# Patient Record
Sex: Male | Born: 1935 | Race: White | Hispanic: No | Smoking: Former smoker
Health system: Southern US, Community
[De-identification: ages and names within clinical notes are randomized; demographics above are authoritative.]

## PROBLEM LIST (undated history)

## (undated) DIAGNOSIS — J449 Chronic obstructive pulmonary disease, unspecified: Secondary | ICD-10-CM

## (undated) DIAGNOSIS — N400 Enlarged prostate without lower urinary tract symptoms: Secondary | ICD-10-CM

## (undated) DIAGNOSIS — K219 Gastro-esophageal reflux disease without esophagitis: Secondary | ICD-10-CM

## (undated) DIAGNOSIS — I4821 Permanent atrial fibrillation: Secondary | ICD-10-CM

## (undated) DIAGNOSIS — Z9289 Personal history of other medical treatment: Secondary | ICD-10-CM

## (undated) DIAGNOSIS — E785 Hyperlipidemia, unspecified: Secondary | ICD-10-CM

## (undated) DIAGNOSIS — M545 Low back pain, unspecified: Secondary | ICD-10-CM

## (undated) DIAGNOSIS — I714 Abdominal aortic aneurysm, without rupture, unspecified: Secondary | ICD-10-CM

## (undated) DIAGNOSIS — I10 Essential (primary) hypertension: Secondary | ICD-10-CM

## (undated) DIAGNOSIS — I5032 Chronic diastolic (congestive) heart failure: Secondary | ICD-10-CM

## (undated) DIAGNOSIS — Z8619 Personal history of other infectious and parasitic diseases: Secondary | ICD-10-CM

## (undated) DIAGNOSIS — I2510 Atherosclerotic heart disease of native coronary artery without angina pectoris: Secondary | ICD-10-CM

## (undated) DIAGNOSIS — F419 Anxiety disorder, unspecified: Secondary | ICD-10-CM

## (undated) DIAGNOSIS — I495 Sick sinus syndrome: Secondary | ICD-10-CM

## (undated) DIAGNOSIS — K635 Polyp of colon: Secondary | ICD-10-CM

## (undated) DIAGNOSIS — I739 Peripheral vascular disease, unspecified: Secondary | ICD-10-CM

## (undated) DIAGNOSIS — M1990 Unspecified osteoarthritis, unspecified site: Secondary | ICD-10-CM

## (undated) DIAGNOSIS — K5730 Diverticulosis of large intestine without perforation or abscess without bleeding: Secondary | ICD-10-CM

## (undated) DIAGNOSIS — R918 Other nonspecific abnormal finding of lung field: Secondary | ICD-10-CM

## (undated) DIAGNOSIS — G459 Transient cerebral ischemic attack, unspecified: Secondary | ICD-10-CM

## (undated) DIAGNOSIS — M519 Unspecified thoracic, thoracolumbar and lumbosacral intervertebral disc disorder: Secondary | ICD-10-CM

## (undated) DIAGNOSIS — I359 Nonrheumatic aortic valve disorder, unspecified: Secondary | ICD-10-CM

## (undated) HISTORY — PX: PACEMAKER PLACEMENT: SHX43

## (undated) HISTORY — DX: Personal history of other infectious and parasitic diseases: Z86.19

## (undated) HISTORY — DX: Transient cerebral ischemic attack, unspecified: G45.9

## (undated) HISTORY — DX: Anxiety disorder, unspecified: F41.9

## (undated) HISTORY — DX: Nonrheumatic aortic valve disorder, unspecified: I35.9

## (undated) HISTORY — DX: Hyperlipidemia, unspecified: E78.5

## (undated) HISTORY — DX: Atherosclerotic heart disease of native coronary artery without angina pectoris: I25.10

## (undated) HISTORY — DX: Low back pain: M54.5

## (undated) HISTORY — DX: Polyp of colon: K63.5

## (undated) HISTORY — PX: AORTIC VALVE REPLACEMENT: SHX41

## (undated) HISTORY — DX: Abdominal aortic aneurysm, without rupture, unspecified: I71.40

## (undated) HISTORY — DX: Benign prostatic hyperplasia without lower urinary tract symptoms: N40.0

## (undated) HISTORY — DX: Unspecified osteoarthritis, unspecified site: M19.90

## (undated) HISTORY — DX: Diverticulosis of large intestine without perforation or abscess without bleeding: K57.30

## (undated) HISTORY — DX: Essential (primary) hypertension: I10

## (undated) HISTORY — PX: ABDOMINAL AORTIC ANEURYSM REPAIR: SUR1152

## (undated) HISTORY — DX: Chronic diastolic (congestive) heart failure: I50.32

## (undated) HISTORY — DX: Gastro-esophageal reflux disease without esophagitis: K21.9

## (undated) HISTORY — DX: Abdominal aortic aneurysm, without rupture: I71.4

## (undated) HISTORY — DX: Low back pain, unspecified: M54.50

## (undated) HISTORY — DX: Peripheral vascular disease, unspecified: I73.9

## (undated) HISTORY — DX: Sick sinus syndrome: I49.5

## (undated) HISTORY — DX: Permanent atrial fibrillation: I48.21

## (undated) HISTORY — DX: Other nonspecific abnormal finding of lung field: R91.8

## (undated) HISTORY — DX: Chronic obstructive pulmonary disease, unspecified: J44.9

## (undated) HISTORY — PX: RETINAL DETACHMENT SURGERY: SHX105

## (undated) HISTORY — DX: Personal history of other medical treatment: Z92.89

## (undated) HISTORY — DX: Unspecified thoracic, thoracolumbar and lumbosacral intervertebral disc disorder: M51.9

---

## 1991-06-09 HISTORY — PX: CORONARY ARTERY BYPASS GRAFT: SHX141

## 1997-11-15 ENCOUNTER — Ambulatory Visit (HOSPITAL_COMMUNITY): Admission: RE | Admit: 1997-11-15 | Discharge: 1997-11-15 | Payer: Self-pay | Admitting: Internal Medicine

## 1998-10-21 ENCOUNTER — Inpatient Hospital Stay (HOSPITAL_COMMUNITY): Admission: RE | Admit: 1998-10-21 | Discharge: 1998-10-23 | Payer: Self-pay | Admitting: Gastroenterology

## 1999-06-23 ENCOUNTER — Encounter: Admission: RE | Admit: 1999-06-23 | Discharge: 1999-06-23 | Payer: Self-pay | Admitting: *Deleted

## 1999-06-23 ENCOUNTER — Encounter: Payer: Self-pay | Admitting: *Deleted

## 1999-07-08 ENCOUNTER — Encounter: Payer: Self-pay | Admitting: *Deleted

## 1999-07-09 ENCOUNTER — Ambulatory Visit: Admission: RE | Admit: 1999-07-09 | Discharge: 1999-07-09 | Payer: Self-pay | Admitting: *Deleted

## 1999-08-12 ENCOUNTER — Inpatient Hospital Stay (HOSPITAL_COMMUNITY): Admission: RE | Admit: 1999-08-12 | Discharge: 1999-08-14 | Payer: Self-pay | Admitting: *Deleted

## 1999-08-12 ENCOUNTER — Encounter: Payer: Self-pay | Admitting: *Deleted

## 1999-10-06 ENCOUNTER — Encounter: Payer: Self-pay | Admitting: *Deleted

## 1999-10-06 ENCOUNTER — Encounter: Admission: RE | Admit: 1999-10-06 | Discharge: 1999-10-06 | Payer: Self-pay | Admitting: *Deleted

## 1999-12-29 ENCOUNTER — Encounter: Payer: Self-pay | Admitting: *Deleted

## 1999-12-29 ENCOUNTER — Encounter: Admission: RE | Admit: 1999-12-29 | Discharge: 1999-12-29 | Payer: Self-pay | Admitting: *Deleted

## 2000-07-05 ENCOUNTER — Encounter: Payer: Self-pay | Admitting: *Deleted

## 2000-07-05 ENCOUNTER — Encounter: Admission: RE | Admit: 2000-07-05 | Discharge: 2000-07-05 | Payer: Self-pay | Admitting: Gastroenterology

## 2000-12-27 ENCOUNTER — Encounter: Payer: Self-pay | Admitting: *Deleted

## 2000-12-27 ENCOUNTER — Encounter: Admission: RE | Admit: 2000-12-27 | Discharge: 2000-12-27 | Payer: Self-pay | Admitting: *Deleted

## 2001-02-25 ENCOUNTER — Encounter (INDEPENDENT_AMBULATORY_CARE_PROVIDER_SITE_OTHER): Payer: Self-pay | Admitting: Specialist

## 2001-02-25 ENCOUNTER — Other Ambulatory Visit: Admission: RE | Admit: 2001-02-25 | Discharge: 2001-02-25 | Payer: Self-pay | Admitting: Gastroenterology

## 2001-12-26 ENCOUNTER — Encounter: Admission: RE | Admit: 2001-12-26 | Discharge: 2001-12-26 | Payer: Self-pay | Admitting: *Deleted

## 2001-12-26 ENCOUNTER — Encounter: Payer: Self-pay | Admitting: *Deleted

## 2003-01-22 ENCOUNTER — Encounter: Admission: RE | Admit: 2003-01-22 | Discharge: 2003-01-22 | Payer: Self-pay | Admitting: *Deleted

## 2003-01-22 ENCOUNTER — Encounter: Payer: Self-pay | Admitting: *Deleted

## 2003-07-24 ENCOUNTER — Ambulatory Visit (HOSPITAL_COMMUNITY): Admission: RE | Admit: 2003-07-24 | Discharge: 2003-07-24 | Payer: Self-pay | Admitting: Internal Medicine

## 2004-01-21 ENCOUNTER — Encounter: Admission: RE | Admit: 2004-01-21 | Discharge: 2004-01-21 | Payer: Self-pay | Admitting: *Deleted

## 2004-05-05 ENCOUNTER — Ambulatory Visit: Payer: Self-pay | Admitting: Internal Medicine

## 2004-05-14 ENCOUNTER — Ambulatory Visit: Payer: Self-pay | Admitting: *Deleted

## 2004-05-20 ENCOUNTER — Ambulatory Visit: Payer: Self-pay

## 2004-05-20 ENCOUNTER — Ambulatory Visit: Payer: Self-pay | Admitting: *Deleted

## 2004-06-03 ENCOUNTER — Ambulatory Visit: Payer: Self-pay | Admitting: Cardiology

## 2004-06-24 ENCOUNTER — Ambulatory Visit: Payer: Self-pay | Admitting: Internal Medicine

## 2004-07-22 ENCOUNTER — Ambulatory Visit: Payer: Self-pay | Admitting: Cardiology

## 2004-08-01 ENCOUNTER — Ambulatory Visit: Payer: Self-pay | Admitting: Cardiology

## 2004-08-22 ENCOUNTER — Ambulatory Visit: Payer: Self-pay | Admitting: Cardiology

## 2004-09-10 ENCOUNTER — Ambulatory Visit: Payer: Self-pay | Admitting: *Deleted

## 2004-09-10 ENCOUNTER — Ambulatory Visit: Payer: Self-pay | Admitting: Internal Medicine

## 2004-10-08 ENCOUNTER — Ambulatory Visit: Payer: Self-pay | Admitting: *Deleted

## 2004-11-05 ENCOUNTER — Ambulatory Visit: Payer: Self-pay | Admitting: Cardiology

## 2004-11-26 ENCOUNTER — Ambulatory Visit: Payer: Self-pay | Admitting: *Deleted

## 2004-12-16 ENCOUNTER — Ambulatory Visit: Payer: Self-pay | Admitting: Cardiology

## 2005-01-06 ENCOUNTER — Ambulatory Visit: Payer: Self-pay | Admitting: Internal Medicine

## 2005-01-14 ENCOUNTER — Ambulatory Visit: Payer: Self-pay | Admitting: *Deleted

## 2005-01-23 ENCOUNTER — Ambulatory Visit: Payer: Self-pay | Admitting: *Deleted

## 2005-01-26 ENCOUNTER — Encounter: Admission: RE | Admit: 2005-01-26 | Discharge: 2005-01-26 | Payer: Self-pay | Admitting: *Deleted

## 2005-01-28 ENCOUNTER — Ambulatory Visit: Payer: Self-pay | Admitting: Cardiology

## 2005-01-28 ENCOUNTER — Ambulatory Visit: Payer: Self-pay | Admitting: Internal Medicine

## 2005-01-30 ENCOUNTER — Ambulatory Visit: Payer: Self-pay | Admitting: Internal Medicine

## 2005-01-30 ENCOUNTER — Ambulatory Visit (HOSPITAL_COMMUNITY): Admission: RE | Admit: 2005-01-30 | Discharge: 2005-01-30 | Payer: Self-pay | Admitting: Internal Medicine

## 2005-02-02 ENCOUNTER — Ambulatory Visit: Payer: Self-pay | Admitting: Internal Medicine

## 2005-02-02 ENCOUNTER — Inpatient Hospital Stay (HOSPITAL_COMMUNITY): Admission: EM | Admit: 2005-02-02 | Discharge: 2005-02-05 | Payer: Self-pay | Admitting: Emergency Medicine

## 2005-02-03 ENCOUNTER — Ambulatory Visit: Payer: Self-pay | Admitting: Internal Medicine

## 2005-02-04 ENCOUNTER — Encounter: Payer: Self-pay | Admitting: Cardiology

## 2005-02-12 ENCOUNTER — Ambulatory Visit: Payer: Self-pay | Admitting: Internal Medicine

## 2005-02-12 ENCOUNTER — Ambulatory Visit: Payer: Self-pay | Admitting: Cardiology

## 2005-02-17 ENCOUNTER — Ambulatory Visit: Payer: Self-pay | Admitting: Internal Medicine

## 2005-02-19 ENCOUNTER — Ambulatory Visit: Payer: Self-pay | Admitting: Internal Medicine

## 2005-03-04 ENCOUNTER — Ambulatory Visit: Payer: Self-pay | Admitting: Internal Medicine

## 2005-03-05 ENCOUNTER — Ambulatory Visit: Payer: Self-pay | Admitting: Cardiology

## 2005-03-13 ENCOUNTER — Ambulatory Visit: Payer: Self-pay | Admitting: Internal Medicine

## 2005-03-27 ENCOUNTER — Ambulatory Visit: Payer: Self-pay | Admitting: Cardiovascular Disease

## 2005-04-09 ENCOUNTER — Ambulatory Visit: Payer: Self-pay | Admitting: Internal Medicine

## 2005-04-17 ENCOUNTER — Ambulatory Visit: Payer: Self-pay | Admitting: Cardiology

## 2005-04-24 ENCOUNTER — Ambulatory Visit: Payer: Self-pay | Admitting: Internal Medicine

## 2005-05-14 ENCOUNTER — Ambulatory Visit: Payer: Self-pay | Admitting: *Deleted

## 2005-05-14 ENCOUNTER — Encounter: Payer: Self-pay | Admitting: Internal Medicine

## 2005-05-14 ENCOUNTER — Ambulatory Visit: Payer: Self-pay

## 2005-05-14 ENCOUNTER — Ambulatory Visit: Payer: Self-pay | Admitting: Cardiology

## 2005-06-16 ENCOUNTER — Ambulatory Visit: Payer: Self-pay | Admitting: *Deleted

## 2005-06-16 ENCOUNTER — Ambulatory Visit: Payer: Self-pay | Admitting: Cardiology

## 2005-07-02 ENCOUNTER — Ambulatory Visit: Payer: Self-pay | Admitting: Cardiology

## 2005-07-02 ENCOUNTER — Ambulatory Visit: Payer: Self-pay | Admitting: *Deleted

## 2005-07-06 ENCOUNTER — Ambulatory Visit: Payer: Self-pay | Admitting: Internal Medicine

## 2005-07-08 ENCOUNTER — Ambulatory Visit: Payer: Self-pay | Admitting: Cardiology

## 2005-07-10 ENCOUNTER — Ambulatory Visit: Payer: Self-pay | Admitting: Internal Medicine

## 2005-07-16 ENCOUNTER — Ambulatory Visit: Payer: Self-pay | Admitting: *Deleted

## 2005-07-21 ENCOUNTER — Ambulatory Visit: Payer: Self-pay | Admitting: *Deleted

## 2005-08-11 ENCOUNTER — Ambulatory Visit: Payer: Self-pay | Admitting: Internal Medicine

## 2005-08-13 ENCOUNTER — Ambulatory Visit: Payer: Self-pay | Admitting: Cardiology

## 2005-08-13 ENCOUNTER — Ambulatory Visit: Payer: Self-pay

## 2005-08-13 ENCOUNTER — Encounter: Payer: Self-pay | Admitting: Cardiology

## 2005-08-19 ENCOUNTER — Ambulatory Visit: Payer: Self-pay | Admitting: *Deleted

## 2005-08-20 ENCOUNTER — Ambulatory Visit (HOSPITAL_COMMUNITY): Admission: RE | Admit: 2005-08-20 | Discharge: 2005-08-20 | Payer: Self-pay | Admitting: Internal Medicine

## 2005-08-20 ENCOUNTER — Ambulatory Visit: Payer: Self-pay | Admitting: Internal Medicine

## 2005-08-24 ENCOUNTER — Ambulatory Visit: Payer: Self-pay | Admitting: Internal Medicine

## 2005-09-02 ENCOUNTER — Ambulatory Visit: Payer: Self-pay | Admitting: *Deleted

## 2005-09-15 ENCOUNTER — Ambulatory Visit: Payer: Self-pay

## 2005-09-15 ENCOUNTER — Ambulatory Visit: Payer: Self-pay | Admitting: Cardiology

## 2005-09-16 ENCOUNTER — Ambulatory Visit: Payer: Self-pay | Admitting: *Deleted

## 2005-09-22 ENCOUNTER — Encounter: Payer: Self-pay | Admitting: Cardiovascular Disease

## 2005-09-22 ENCOUNTER — Ambulatory Visit: Payer: Self-pay

## 2005-09-22 ENCOUNTER — Ambulatory Visit: Payer: Self-pay | Admitting: Internal Medicine

## 2005-09-29 ENCOUNTER — Ambulatory Visit: Payer: Self-pay | Admitting: Cardiovascular Disease

## 2005-10-13 ENCOUNTER — Ambulatory Visit: Payer: Self-pay | Admitting: *Deleted

## 2005-10-20 ENCOUNTER — Ambulatory Visit: Payer: Self-pay | Admitting: Cardiology

## 2005-11-10 ENCOUNTER — Ambulatory Visit: Payer: Self-pay | Admitting: Cardiology

## 2005-11-24 ENCOUNTER — Ambulatory Visit: Payer: Self-pay | Admitting: Cardiology

## 2005-12-15 ENCOUNTER — Ambulatory Visit: Payer: Self-pay | Admitting: *Deleted

## 2005-12-21 ENCOUNTER — Ambulatory Visit: Payer: Self-pay | Admitting: Internal Medicine

## 2006-01-19 ENCOUNTER — Ambulatory Visit: Payer: Self-pay | Admitting: Cardiology

## 2006-01-29 ENCOUNTER — Ambulatory Visit: Payer: Self-pay | Admitting: Cardiology

## 2006-02-05 ENCOUNTER — Ambulatory Visit: Payer: Self-pay | Admitting: Cardiology

## 2006-02-11 ENCOUNTER — Encounter: Admission: RE | Admit: 2006-02-11 | Discharge: 2006-02-11 | Payer: Self-pay | Admitting: *Deleted

## 2006-02-19 ENCOUNTER — Ambulatory Visit: Payer: Self-pay | Admitting: Cardiology

## 2006-03-12 ENCOUNTER — Ambulatory Visit: Payer: Self-pay | Admitting: Cardiovascular Disease

## 2006-04-09 ENCOUNTER — Ambulatory Visit: Payer: Self-pay | Admitting: Cardiology

## 2006-04-13 ENCOUNTER — Ambulatory Visit: Payer: Self-pay | Admitting: *Deleted

## 2006-04-23 ENCOUNTER — Ambulatory Visit: Payer: Self-pay | Admitting: Cardiovascular Disease

## 2006-04-30 ENCOUNTER — Ambulatory Visit: Payer: Self-pay | Admitting: Internal Medicine

## 2006-05-07 ENCOUNTER — Ambulatory Visit: Payer: Self-pay | Admitting: Cardiology

## 2006-05-21 ENCOUNTER — Ambulatory Visit: Payer: Self-pay | Admitting: Internal Medicine

## 2006-06-04 ENCOUNTER — Ambulatory Visit: Payer: Self-pay | Admitting: Cardiology

## 2006-06-28 ENCOUNTER — Ambulatory Visit: Payer: Self-pay | Admitting: Cardiology

## 2006-07-26 ENCOUNTER — Ambulatory Visit: Payer: Self-pay | Admitting: Internal Medicine

## 2006-07-26 ENCOUNTER — Ambulatory Visit: Payer: Self-pay

## 2006-08-23 ENCOUNTER — Ambulatory Visit: Payer: Self-pay | Admitting: Internal Medicine

## 2006-09-06 ENCOUNTER — Ambulatory Visit: Payer: Self-pay | Admitting: Internal Medicine

## 2006-09-06 ENCOUNTER — Ambulatory Visit: Payer: Self-pay | Admitting: Cardiovascular Disease

## 2006-10-04 ENCOUNTER — Ambulatory Visit: Payer: Self-pay | Admitting: Cardiology

## 2006-10-12 ENCOUNTER — Ambulatory Visit: Payer: Self-pay | Admitting: *Deleted

## 2006-10-12 LAB — CONVERTED CEMR LAB
BUN: 12 mg/dL (ref 6–23)
Calcium: 8.8 mg/dL (ref 8.4–10.5)
Chloride: 107 meq/L (ref 96–112)
Creatinine, Ser: 0.9 mg/dL (ref 0.4–1.5)
GFR calc Af Amer: 105 mL/min
Potassium: 3.9 meq/L (ref 3.5–5.1)

## 2006-10-13 ENCOUNTER — Ambulatory Visit: Payer: Self-pay | Admitting: Gastroenterology

## 2006-11-02 ENCOUNTER — Ambulatory Visit: Payer: Self-pay | Admitting: Cardiology

## 2006-11-03 ENCOUNTER — Ambulatory Visit: Payer: Self-pay | Admitting: Internal Medicine

## 2006-11-12 ENCOUNTER — Ambulatory Visit: Payer: Self-pay | Admitting: Cardiology

## 2006-12-03 ENCOUNTER — Ambulatory Visit: Payer: Self-pay | Admitting: Cardiology

## 2006-12-27 ENCOUNTER — Ambulatory Visit: Payer: Self-pay | Admitting: Internal Medicine

## 2006-12-31 ENCOUNTER — Ambulatory Visit: Payer: Self-pay | Admitting: Cardiology

## 2007-01-28 ENCOUNTER — Ambulatory Visit: Payer: Self-pay | Admitting: Cardiovascular Disease

## 2007-02-10 ENCOUNTER — Ambulatory Visit: Payer: Self-pay | Admitting: Cardiology

## 2007-02-10 ENCOUNTER — Encounter: Payer: Self-pay | Admitting: Internal Medicine

## 2007-02-10 DIAGNOSIS — I495 Sick sinus syndrome: Secondary | ICD-10-CM | POA: Insufficient documentation

## 2007-02-10 DIAGNOSIS — E785 Hyperlipidemia, unspecified: Secondary | ICD-10-CM | POA: Insufficient documentation

## 2007-02-10 DIAGNOSIS — J449 Chronic obstructive pulmonary disease, unspecified: Secondary | ICD-10-CM | POA: Insufficient documentation

## 2007-02-10 DIAGNOSIS — J4489 Other specified chronic obstructive pulmonary disease: Secondary | ICD-10-CM | POA: Insufficient documentation

## 2007-02-10 DIAGNOSIS — N400 Enlarged prostate without lower urinary tract symptoms: Secondary | ICD-10-CM | POA: Insufficient documentation

## 2007-02-10 DIAGNOSIS — I10 Essential (primary) hypertension: Secondary | ICD-10-CM | POA: Insufficient documentation

## 2007-02-10 DIAGNOSIS — M1990 Unspecified osteoarthritis, unspecified site: Secondary | ICD-10-CM | POA: Insufficient documentation

## 2007-02-10 LAB — CONVERTED CEMR LAB
ALT: 26 units/L (ref 0–53)
Albumin: 4 g/dL (ref 3.5–5.2)
Basophils Absolute: 0 10*3/uL (ref 0.0–0.1)
CO2: 30 meq/L (ref 19–32)
Creatinine, Ser: 1 mg/dL (ref 0.4–1.5)
Glucose, Bld: 140 mg/dL — ABNORMAL HIGH (ref 70–99)
HCT: 39.3 % (ref 39.0–52.0)
Monocytes Absolute: 0.7 10*3/uL (ref 0.2–0.7)
Monocytes Relative: 9.2 % (ref 3.0–11.0)
Neutro Abs: 5.2 10*3/uL (ref 1.4–7.7)
Neutrophils Relative %: 70.5 % (ref 43.0–77.0)
Platelets: 198 10*3/uL (ref 150–400)
Potassium: 3.9 meq/L (ref 3.5–5.1)
Pro B Natriuretic peptide (BNP): 142 pg/mL — ABNORMAL HIGH (ref 0.0–100.0)
Total Bilirubin: 1.3 mg/dL — ABNORMAL HIGH (ref 0.3–1.2)
Total CHOL/HDL Ratio: 4.2
WBC: 7.5 10*3/uL (ref 4.5–10.5)

## 2007-02-21 ENCOUNTER — Ambulatory Visit: Payer: Self-pay | Admitting: Internal Medicine

## 2007-02-24 ENCOUNTER — Ambulatory Visit: Payer: Self-pay | Admitting: *Deleted

## 2007-02-24 ENCOUNTER — Encounter: Admission: RE | Admit: 2007-02-24 | Discharge: 2007-02-24 | Payer: Self-pay | Admitting: *Deleted

## 2007-02-25 ENCOUNTER — Ambulatory Visit: Payer: Self-pay | Admitting: Cardiovascular Disease

## 2007-02-25 ENCOUNTER — Ambulatory Visit: Payer: Self-pay

## 2007-02-25 ENCOUNTER — Encounter: Payer: Self-pay | Admitting: Cardiology

## 2007-03-25 ENCOUNTER — Ambulatory Visit: Payer: Self-pay | Admitting: Cardiology

## 2007-04-18 ENCOUNTER — Ambulatory Visit: Payer: Self-pay | Admitting: Internal Medicine

## 2007-04-22 ENCOUNTER — Ambulatory Visit: Payer: Self-pay | Admitting: Cardiology

## 2007-05-20 ENCOUNTER — Ambulatory Visit: Payer: Self-pay | Admitting: Cardiology

## 2007-06-03 ENCOUNTER — Ambulatory Visit: Payer: Self-pay | Admitting: Internal Medicine

## 2007-06-03 DIAGNOSIS — I4891 Unspecified atrial fibrillation: Secondary | ICD-10-CM | POA: Insufficient documentation

## 2007-06-03 DIAGNOSIS — I5032 Chronic diastolic (congestive) heart failure: Secondary | ICD-10-CM

## 2007-06-03 DIAGNOSIS — I739 Peripheral vascular disease, unspecified: Secondary | ICD-10-CM | POA: Insufficient documentation

## 2007-06-03 DIAGNOSIS — J441 Chronic obstructive pulmonary disease with (acute) exacerbation: Secondary | ICD-10-CM

## 2007-06-03 DIAGNOSIS — K219 Gastro-esophageal reflux disease without esophagitis: Secondary | ICD-10-CM

## 2007-06-03 DIAGNOSIS — Z860100 Personal history of colon polyps, unspecified: Secondary | ICD-10-CM | POA: Insufficient documentation

## 2007-06-03 DIAGNOSIS — Z8679 Personal history of other diseases of the circulatory system: Secondary | ICD-10-CM | POA: Insufficient documentation

## 2007-06-03 DIAGNOSIS — F411 Generalized anxiety disorder: Secondary | ICD-10-CM | POA: Insufficient documentation

## 2007-06-03 DIAGNOSIS — K5730 Diverticulosis of large intestine without perforation or abscess without bleeding: Secondary | ICD-10-CM | POA: Insufficient documentation

## 2007-06-03 DIAGNOSIS — M545 Low back pain: Secondary | ICD-10-CM

## 2007-06-13 ENCOUNTER — Ambulatory Visit: Payer: Self-pay | Admitting: Internal Medicine

## 2007-06-17 ENCOUNTER — Ambulatory Visit: Payer: Self-pay | Admitting: Cardiology

## 2007-07-11 ENCOUNTER — Ambulatory Visit: Payer: Self-pay

## 2007-07-20 ENCOUNTER — Ambulatory Visit: Payer: Self-pay | Admitting: Cardiology

## 2007-07-22 ENCOUNTER — Telehealth: Payer: Self-pay | Admitting: Internal Medicine

## 2007-08-15 ENCOUNTER — Ambulatory Visit: Payer: Self-pay | Admitting: Cardiology

## 2007-09-12 ENCOUNTER — Ambulatory Visit: Payer: Self-pay | Admitting: Internal Medicine

## 2007-09-12 ENCOUNTER — Ambulatory Visit: Payer: Self-pay | Admitting: Cardiology

## 2007-09-12 LAB — CONVERTED CEMR LAB
ALT: 24 units/L (ref 0–53)
Albumin: 3.9 g/dL (ref 3.5–5.2)
Alkaline Phosphatase: 69 units/L (ref 39–117)
Bilirubin, Direct: 0.2 mg/dL (ref 0.0–0.3)
LDL Cholesterol: 47 mg/dL (ref 0–99)
Total Bilirubin: 1.3 mg/dL — ABNORMAL HIGH (ref 0.3–1.2)
Total Protein: 6.7 g/dL (ref 6.0–8.3)

## 2007-10-10 ENCOUNTER — Ambulatory Visit: Payer: Self-pay | Admitting: Internal Medicine

## 2007-11-04 ENCOUNTER — Ambulatory Visit: Payer: Self-pay | Admitting: Cardiology

## 2007-11-04 ENCOUNTER — Telehealth: Payer: Self-pay | Admitting: Internal Medicine

## 2007-11-04 ENCOUNTER — Ambulatory Visit: Payer: Self-pay | Admitting: Internal Medicine

## 2007-11-04 DIAGNOSIS — R197 Diarrhea, unspecified: Secondary | ICD-10-CM | POA: Insufficient documentation

## 2007-11-04 LAB — CONVERTED CEMR LAB
BUN: 17 mg/dL (ref 6–23)
Calcium: 8.9 mg/dL (ref 8.4–10.5)
Chloride: 105 meq/L (ref 96–112)
GFR calc non Af Amer: 77 mL/min
Glucose, Bld: 101 mg/dL — ABNORMAL HIGH (ref 70–99)
HCT: 36.4 % — ABNORMAL LOW (ref 39.0–52.0)
Hemoglobin: 12.3 g/dL — ABNORMAL LOW (ref 13.0–17.0)
MCV: 97.1 fL (ref 78.0–100.0)
Neutro Abs: 6.4 10*3/uL (ref 1.4–7.7)
Platelets: 199 10*3/uL (ref 150–400)
Potassium: 4.1 meq/L (ref 3.5–5.1)
RBC: 3.75 M/uL — ABNORMAL LOW (ref 4.22–5.81)
Sodium: 139 meq/L (ref 135–145)
WBC: 8.9 10*3/uL (ref 4.5–10.5)

## 2007-11-06 ENCOUNTER — Telehealth: Payer: Self-pay | Admitting: Internal Medicine

## 2007-11-14 ENCOUNTER — Ambulatory Visit: Payer: Self-pay | Admitting: Cardiology

## 2007-11-24 ENCOUNTER — Telehealth: Payer: Self-pay | Admitting: Internal Medicine

## 2007-11-25 ENCOUNTER — Ambulatory Visit: Payer: Self-pay | Admitting: Internal Medicine

## 2007-12-01 ENCOUNTER — Telehealth: Payer: Self-pay | Admitting: Internal Medicine

## 2007-12-02 ENCOUNTER — Telehealth: Payer: Self-pay | Admitting: Internal Medicine

## 2007-12-02 ENCOUNTER — Encounter: Payer: Self-pay | Admitting: Family Medicine

## 2007-12-02 ENCOUNTER — Ambulatory Visit: Payer: Self-pay | Admitting: Internal Medicine

## 2007-12-02 DIAGNOSIS — A0472 Enterocolitis due to Clostridium difficile, not specified as recurrent: Secondary | ICD-10-CM | POA: Insufficient documentation

## 2007-12-07 ENCOUNTER — Ambulatory Visit: Payer: Self-pay | Admitting: Internal Medicine

## 2008-01-03 ENCOUNTER — Ambulatory Visit: Payer: Self-pay | Admitting: Internal Medicine

## 2008-01-03 DIAGNOSIS — R197 Diarrhea, unspecified: Secondary | ICD-10-CM | POA: Insufficient documentation

## 2008-01-04 ENCOUNTER — Encounter: Payer: Self-pay | Admitting: Internal Medicine

## 2008-01-04 ENCOUNTER — Telehealth: Payer: Self-pay | Admitting: Internal Medicine

## 2008-01-05 ENCOUNTER — Telehealth: Payer: Self-pay | Admitting: Internal Medicine

## 2008-01-10 ENCOUNTER — Telehealth: Payer: Self-pay | Admitting: Gastroenterology

## 2008-01-11 ENCOUNTER — Ambulatory Visit: Payer: Self-pay

## 2008-01-11 ENCOUNTER — Ambulatory Visit: Payer: Self-pay | Admitting: Cardiology

## 2008-01-12 ENCOUNTER — Ambulatory Visit: Payer: Self-pay | Admitting: Internal Medicine

## 2008-01-16 ENCOUNTER — Telehealth: Payer: Self-pay | Admitting: Internal Medicine

## 2008-01-27 ENCOUNTER — Ambulatory Visit: Payer: Self-pay | Admitting: Internal Medicine

## 2008-02-03 ENCOUNTER — Ambulatory Visit: Payer: Self-pay | Admitting: Cardiology

## 2008-02-17 ENCOUNTER — Ambulatory Visit: Payer: Self-pay | Admitting: Internal Medicine

## 2008-02-17 ENCOUNTER — Telehealth: Payer: Self-pay | Admitting: Internal Medicine

## 2008-03-01 ENCOUNTER — Ambulatory Visit: Payer: Self-pay | Admitting: *Deleted

## 2008-03-09 ENCOUNTER — Ambulatory Visit: Payer: Self-pay | Admitting: Internal Medicine

## 2008-03-09 DIAGNOSIS — M2550 Pain in unspecified joint: Secondary | ICD-10-CM | POA: Insufficient documentation

## 2008-03-09 LAB — CONVERTED CEMR LAB
BUN: 13 mg/dL (ref 6–23)
Basophils Absolute: 0 10*3/uL (ref 0.0–0.1)
Basophils Relative: 0.3 % (ref 0.0–3.0)
CO2: 27 meq/L (ref 19–32)
Calcium: 8.6 mg/dL (ref 8.4–10.5)
Chloride: 101 meq/L (ref 96–112)
Creatinine, Ser: 0.8 mg/dL (ref 0.4–1.5)
Eosinophils Absolute: 0 10*3/uL (ref 0.0–0.7)
Eosinophils Relative: 0.5 % (ref 0.0–5.0)
GFR calc Af Amer: 120 mL/min
GFR calc non Af Amer: 99 mL/min
Glucose, Bld: 98 mg/dL (ref 70–99)
HCT: 37.1 % — ABNORMAL LOW (ref 39.0–52.0)
Hemoglobin: 12.8 g/dL — ABNORMAL LOW (ref 13.0–17.0)
Lymphocytes Relative: 10.9 % — ABNORMAL LOW (ref 12.0–46.0)
MCHC: 34.5 g/dL (ref 30.0–36.0)
MCV: 99.4 fL (ref 78.0–100.0)
Monocytes Absolute: 1 10*3/uL (ref 0.1–1.0)
Monocytes Relative: 10.4 % (ref 3.0–12.0)
Neutro Abs: 7.5 10*3/uL (ref 1.4–7.7)
Neutrophils Relative %: 77.9 % — ABNORMAL HIGH (ref 43.0–77.0)
Platelets: 157 10*3/uL (ref 150–400)
Potassium: 4.2 meq/L (ref 3.5–5.1)
RBC: 3.73 M/uL — ABNORMAL LOW (ref 4.22–5.81)
RDW: 15.2 % — ABNORMAL HIGH (ref 11.5–14.6)
Sodium: 136 meq/L (ref 135–145)
Uric Acid, Serum: 4.4 mg/dL (ref 4.0–7.8)
WBC: 9.5 10*3/uL (ref 4.5–10.5)

## 2008-03-13 ENCOUNTER — Ambulatory Visit: Payer: Self-pay | Admitting: Internal Medicine

## 2008-03-16 ENCOUNTER — Ambulatory Visit: Payer: Self-pay | Admitting: Cardiology

## 2008-03-23 ENCOUNTER — Ambulatory Visit: Payer: Self-pay | Admitting: Cardiology

## 2008-03-30 ENCOUNTER — Ambulatory Visit: Payer: Self-pay | Admitting: Internal Medicine

## 2008-04-04 ENCOUNTER — Telehealth: Payer: Self-pay | Admitting: Internal Medicine

## 2008-04-11 ENCOUNTER — Ambulatory Visit: Payer: Self-pay | Admitting: Internal Medicine

## 2008-04-13 ENCOUNTER — Ambulatory Visit: Payer: Self-pay | Admitting: Cardiology

## 2008-04-20 ENCOUNTER — Ambulatory Visit: Payer: Self-pay | Admitting: Cardiovascular Disease

## 2008-04-24 ENCOUNTER — Ambulatory Visit: Payer: Self-pay | Admitting: Cardiovascular Disease

## 2008-04-24 ENCOUNTER — Ambulatory Visit: Payer: Self-pay | Admitting: Cardiology

## 2008-04-24 LAB — CONVERTED CEMR LAB
Albumin: 3.5 g/dL (ref 3.5–5.2)
Basophils Relative: 0.5 % (ref 0.0–3.0)
Bilirubin, Direct: 0.2 mg/dL (ref 0.0–0.3)
CO2: 28 meq/L (ref 19–32)
Creatinine, Ser: 0.7 mg/dL (ref 0.4–1.5)
Eosinophils Relative: 2.7 % (ref 0.0–5.0)
Glucose, Bld: 120 mg/dL — ABNORMAL HIGH (ref 70–99)
HCT: 39.7 % (ref 39.0–52.0)
Hemoglobin: 13.6 g/dL (ref 13.0–17.0)
LDL Cholesterol: 47 mg/dL (ref 0–99)
MCHC: 34.3 g/dL (ref 30.0–36.0)
MCV: 99.9 fL (ref 78.0–100.0)
Neutrophils Relative %: 62.7 % (ref 43.0–77.0)
Platelets: 158 10*3/uL (ref 150–400)
Potassium: 4.2 meq/L (ref 3.5–5.1)
RDW: 14.8 % — ABNORMAL HIGH (ref 11.5–14.6)
Sodium: 141 meq/L (ref 135–145)
Total Bilirubin: 0.9 mg/dL (ref 0.3–1.2)
Total CHOL/HDL Ratio: 3
Total Protein: 6.2 g/dL (ref 6.0–8.3)
Triglycerides: 93 mg/dL (ref 0–149)

## 2008-04-27 ENCOUNTER — Ambulatory Visit: Payer: Self-pay | Admitting: Internal Medicine

## 2008-04-27 ENCOUNTER — Inpatient Hospital Stay (HOSPITAL_COMMUNITY): Admission: EM | Admit: 2008-04-27 | Discharge: 2008-05-02 | Payer: Self-pay | Admitting: Internal Medicine

## 2008-04-27 ENCOUNTER — Telehealth: Payer: Self-pay | Admitting: Internal Medicine

## 2008-04-27 DIAGNOSIS — R55 Syncope and collapse: Secondary | ICD-10-CM

## 2008-04-30 ENCOUNTER — Ambulatory Visit: Payer: Self-pay | Admitting: Gastroenterology

## 2008-05-04 ENCOUNTER — Telehealth: Payer: Self-pay | Admitting: Family Medicine

## 2008-05-07 ENCOUNTER — Ambulatory Visit: Payer: Self-pay | Admitting: Cardiovascular Disease

## 2008-05-14 ENCOUNTER — Encounter: Payer: Self-pay | Admitting: Cardiology

## 2008-05-14 ENCOUNTER — Ambulatory Visit: Payer: Self-pay

## 2008-05-15 ENCOUNTER — Ambulatory Visit: Payer: Self-pay | Admitting: Internal Medicine

## 2008-05-15 DIAGNOSIS — M109 Gout, unspecified: Secondary | ICD-10-CM | POA: Insufficient documentation

## 2008-05-18 ENCOUNTER — Telehealth: Payer: Self-pay | Admitting: Internal Medicine

## 2008-05-18 ENCOUNTER — Ambulatory Visit: Payer: Self-pay | Admitting: Cardiovascular Disease

## 2008-06-04 ENCOUNTER — Ambulatory Visit: Payer: Self-pay | Admitting: Cardiology

## 2008-06-11 ENCOUNTER — Ambulatory Visit: Payer: Self-pay | Admitting: Internal Medicine

## 2008-06-21 ENCOUNTER — Telehealth: Payer: Self-pay | Admitting: Internal Medicine

## 2008-06-22 ENCOUNTER — Telehealth: Payer: Self-pay | Admitting: Internal Medicine

## 2008-06-27 ENCOUNTER — Ambulatory Visit: Payer: Self-pay | Admitting: Internal Medicine

## 2008-06-29 ENCOUNTER — Ambulatory Visit: Payer: Self-pay | Admitting: Internal Medicine

## 2008-06-29 DIAGNOSIS — G4700 Insomnia, unspecified: Secondary | ICD-10-CM

## 2008-07-04 ENCOUNTER — Ambulatory Visit: Payer: Self-pay | Admitting: Gastroenterology

## 2008-07-06 ENCOUNTER — Ambulatory Visit: Payer: Self-pay

## 2008-07-16 ENCOUNTER — Telehealth: Payer: Self-pay | Admitting: Gastroenterology

## 2008-07-18 ENCOUNTER — Ambulatory Visit: Payer: Self-pay | Admitting: Cardiology

## 2008-07-23 ENCOUNTER — Telehealth: Payer: Self-pay | Admitting: Gastroenterology

## 2008-07-27 ENCOUNTER — Ambulatory Visit: Payer: Self-pay | Admitting: Cardiovascular Disease

## 2008-08-09 ENCOUNTER — Ambulatory Visit: Payer: Self-pay | Admitting: Cardiovascular Disease

## 2008-08-09 ENCOUNTER — Ambulatory Visit: Payer: Self-pay | Admitting: *Deleted

## 2008-08-21 ENCOUNTER — Encounter: Payer: Self-pay | Admitting: Internal Medicine

## 2008-08-28 ENCOUNTER — Encounter: Payer: Self-pay | Admitting: Internal Medicine

## 2008-08-28 ENCOUNTER — Ambulatory Visit: Payer: Self-pay | Admitting: Internal Medicine

## 2008-08-31 ENCOUNTER — Telehealth: Payer: Self-pay | Admitting: Gastroenterology

## 2008-09-01 ENCOUNTER — Ambulatory Visit: Payer: Self-pay | Admitting: *Deleted

## 2008-09-01 ENCOUNTER — Ambulatory Visit (HOSPITAL_COMMUNITY): Admission: RE | Admit: 2008-09-01 | Discharge: 2008-09-01 | Payer: Self-pay | Admitting: *Deleted

## 2008-09-01 DIAGNOSIS — J40 Bronchitis, not specified as acute or chronic: Secondary | ICD-10-CM | POA: Insufficient documentation

## 2008-09-04 ENCOUNTER — Ambulatory Visit: Payer: Self-pay | Admitting: Cardiology

## 2008-09-17 ENCOUNTER — Ambulatory Visit: Payer: Self-pay | Admitting: Internal Medicine

## 2008-09-28 ENCOUNTER — Ambulatory Visit: Payer: Self-pay | Admitting: Cardiology

## 2008-10-09 ENCOUNTER — Telehealth: Payer: Self-pay | Admitting: Cardiology

## 2008-10-12 ENCOUNTER — Ambulatory Visit: Payer: Self-pay | Admitting: Cardiovascular Disease

## 2008-10-24 ENCOUNTER — Telehealth: Payer: Self-pay | Admitting: Internal Medicine

## 2008-10-31 ENCOUNTER — Ambulatory Visit: Payer: Self-pay | Admitting: Internal Medicine

## 2008-10-31 ENCOUNTER — Ambulatory Visit: Payer: Self-pay | Admitting: Cardiology

## 2008-10-31 DIAGNOSIS — Z950 Presence of cardiac pacemaker: Secondary | ICD-10-CM

## 2008-10-31 DIAGNOSIS — I714 Abdominal aortic aneurysm, without rupture, unspecified: Secondary | ICD-10-CM | POA: Insufficient documentation

## 2008-10-31 DIAGNOSIS — Z954 Presence of other heart-valve replacement: Secondary | ICD-10-CM

## 2008-11-01 ENCOUNTER — Telehealth (INDEPENDENT_AMBULATORY_CARE_PROVIDER_SITE_OTHER): Payer: Self-pay

## 2008-11-02 ENCOUNTER — Ambulatory Visit: Payer: Self-pay

## 2008-11-02 ENCOUNTER — Ambulatory Visit: Payer: Self-pay | Admitting: Cardiology

## 2008-11-02 DIAGNOSIS — R002 Palpitations: Secondary | ICD-10-CM | POA: Insufficient documentation

## 2008-11-06 ENCOUNTER — Encounter: Payer: Self-pay | Admitting: *Deleted

## 2008-11-28 ENCOUNTER — Ambulatory Visit: Payer: Self-pay | Admitting: Internal Medicine

## 2008-11-30 ENCOUNTER — Ambulatory Visit: Payer: Self-pay | Admitting: Cardiology

## 2008-11-30 LAB — CONVERTED CEMR LAB: POC INR: 3.1

## 2008-12-12 ENCOUNTER — Encounter: Payer: Self-pay | Admitting: *Deleted

## 2009-01-04 ENCOUNTER — Ambulatory Visit: Payer: Self-pay | Admitting: Cardiology

## 2009-01-04 LAB — CONVERTED CEMR LAB
POC INR: 2
Prothrombin Time: 17.6 s

## 2009-01-18 ENCOUNTER — Ambulatory Visit: Payer: Self-pay | Admitting: Cardiology

## 2009-01-22 ENCOUNTER — Telehealth: Payer: Self-pay | Admitting: Cardiology

## 2009-02-01 ENCOUNTER — Encounter (INDEPENDENT_AMBULATORY_CARE_PROVIDER_SITE_OTHER): Payer: Self-pay | Admitting: *Deleted

## 2009-02-18 ENCOUNTER — Encounter: Payer: Self-pay | Admitting: Internal Medicine

## 2009-02-18 ENCOUNTER — Ambulatory Visit: Payer: Self-pay

## 2009-02-18 ENCOUNTER — Ambulatory Visit: Payer: Self-pay | Admitting: Cardiovascular Disease

## 2009-02-18 LAB — CONVERTED CEMR LAB: POC INR: 2.8

## 2009-02-25 ENCOUNTER — Ambulatory Visit: Payer: Self-pay | Admitting: Internal Medicine

## 2009-02-25 ENCOUNTER — Ambulatory Visit (HOSPITAL_BASED_OUTPATIENT_CLINIC_OR_DEPARTMENT_OTHER): Admission: RE | Admit: 2009-02-25 | Discharge: 2009-02-25 | Payer: Self-pay | Admitting: Internal Medicine

## 2009-02-25 ENCOUNTER — Ambulatory Visit: Payer: Self-pay | Admitting: Diagnostic Radiology

## 2009-02-25 DIAGNOSIS — R05 Cough: Secondary | ICD-10-CM

## 2009-02-25 DIAGNOSIS — R42 Dizziness and giddiness: Secondary | ICD-10-CM

## 2009-02-25 DIAGNOSIS — F29 Unspecified psychosis not due to a substance or known physiological condition: Secondary | ICD-10-CM | POA: Insufficient documentation

## 2009-02-25 DIAGNOSIS — R269 Unspecified abnormalities of gait and mobility: Secondary | ICD-10-CM | POA: Insufficient documentation

## 2009-02-25 LAB — CONVERTED CEMR LAB
BUN: 18 mg/dL (ref 6–23)
Calcium: 9.2 mg/dL (ref 8.4–10.5)
Creatinine, Ser: 1.07 mg/dL (ref 0.40–1.50)
TSH: 1.212 microintl units/mL (ref 0.350–4.500)
Vitamin B-12: 443 pg/mL (ref 211–911)

## 2009-02-26 ENCOUNTER — Telehealth: Payer: Self-pay | Admitting: Internal Medicine

## 2009-02-27 ENCOUNTER — Ambulatory Visit: Payer: Self-pay | Admitting: Internal Medicine

## 2009-03-01 ENCOUNTER — Encounter: Admission: RE | Admit: 2009-03-01 | Discharge: 2009-03-07 | Payer: Self-pay | Admitting: Internal Medicine

## 2009-03-06 ENCOUNTER — Encounter: Payer: Self-pay | Admitting: Internal Medicine

## 2009-03-07 ENCOUNTER — Encounter: Payer: Self-pay | Admitting: Internal Medicine

## 2009-03-18 ENCOUNTER — Telehealth: Payer: Self-pay | Admitting: Internal Medicine

## 2009-03-18 ENCOUNTER — Ambulatory Visit: Payer: Self-pay | Admitting: Cardiology

## 2009-03-29 ENCOUNTER — Ambulatory Visit: Payer: Self-pay | Admitting: Internal Medicine

## 2009-04-17 ENCOUNTER — Ambulatory Visit: Payer: Self-pay | Admitting: Cardiology

## 2009-04-17 ENCOUNTER — Ambulatory Visit: Payer: Self-pay | Admitting: Internal Medicine

## 2009-05-15 ENCOUNTER — Ambulatory Visit: Payer: Self-pay | Admitting: Internal Medicine

## 2009-05-15 ENCOUNTER — Ambulatory Visit: Payer: Self-pay | Admitting: Radiology

## 2009-05-15 ENCOUNTER — Ambulatory Visit (HOSPITAL_BASED_OUTPATIENT_CLINIC_OR_DEPARTMENT_OTHER): Admission: RE | Admit: 2009-05-15 | Discharge: 2009-05-15 | Payer: Self-pay | Admitting: Internal Medicine

## 2009-05-16 ENCOUNTER — Telehealth: Payer: Self-pay | Admitting: Internal Medicine

## 2009-05-17 ENCOUNTER — Ambulatory Visit (HOSPITAL_COMMUNITY): Admission: RE | Admit: 2009-05-17 | Discharge: 2009-05-17 | Payer: Self-pay | Admitting: Cardiology

## 2009-05-17 ENCOUNTER — Ambulatory Visit: Payer: Self-pay

## 2009-05-17 ENCOUNTER — Ambulatory Visit: Payer: Self-pay | Admitting: Cardiology

## 2009-05-17 ENCOUNTER — Ambulatory Visit: Payer: Self-pay | Admitting: Internal Medicine

## 2009-05-17 ENCOUNTER — Encounter: Payer: Self-pay | Admitting: Cardiology

## 2009-05-17 DIAGNOSIS — I2510 Atherosclerotic heart disease of native coronary artery without angina pectoris: Secondary | ICD-10-CM | POA: Insufficient documentation

## 2009-05-22 LAB — CONVERTED CEMR LAB
ALT: 22 units/L (ref 0–53)
Basophils Relative: 0.6 % (ref 0.0–3.0)
Eosinophils Relative: 2.6 % (ref 0.0–5.0)
HCT: 37.4 % — ABNORMAL LOW (ref 39.0–52.0)
HDL: 28.7 mg/dL — ABNORMAL LOW (ref >39.00)
Hemoglobin: 12.6 g/dL — ABNORMAL LOW (ref 13.0–17.0)
LDL Cholesterol: 51 mg/dL (ref 0–99)
MCV: 101.4 fL — ABNORMAL HIGH (ref 78.0–100.0)
Monocytes Absolute: 0.8 10*3/uL (ref 0.1–1.0)
Neutrophils Relative %: 70.5 % (ref 43.0–77.0)
RBC: 3.68 M/uL — ABNORMAL LOW (ref 4.22–5.81)
Total Bilirubin: 1.4 mg/dL — ABNORMAL HIGH (ref 0.3–1.2)
Total CHOL/HDL Ratio: 3
VLDL: 19.8 mg/dL (ref 0.0–40.0)
WBC: 6.8 10*3/uL (ref 4.5–10.5)

## 2009-05-29 ENCOUNTER — Ambulatory Visit: Payer: Self-pay | Admitting: Internal Medicine

## 2009-06-14 ENCOUNTER — Ambulatory Visit: Payer: Self-pay | Admitting: Cardiology

## 2009-06-14 ENCOUNTER — Ambulatory Visit: Payer: Self-pay | Admitting: Internal Medicine

## 2009-07-05 ENCOUNTER — Ambulatory Visit: Payer: Self-pay

## 2009-07-05 ENCOUNTER — Ambulatory Visit: Payer: Self-pay | Admitting: Internal Medicine

## 2009-07-05 ENCOUNTER — Encounter: Payer: Self-pay | Admitting: Internal Medicine

## 2009-07-05 LAB — CONVERTED CEMR LAB: POC INR: 2.5

## 2009-08-02 ENCOUNTER — Ambulatory Visit: Payer: Self-pay | Admitting: Cardiology

## 2009-08-27 ENCOUNTER — Telehealth: Payer: Self-pay | Admitting: Internal Medicine

## 2009-09-05 ENCOUNTER — Ambulatory Visit: Payer: Self-pay | Admitting: Vascular Surgery

## 2009-09-10 ENCOUNTER — Ambulatory Visit: Payer: Self-pay | Admitting: Internal Medicine

## 2009-09-20 ENCOUNTER — Ambulatory Visit: Payer: Self-pay | Admitting: Internal Medicine

## 2009-09-20 LAB — CONVERTED CEMR LAB: POC INR: 2.8

## 2009-09-30 ENCOUNTER — Ambulatory Visit: Payer: Self-pay | Admitting: Internal Medicine

## 2009-10-09 ENCOUNTER — Ambulatory Visit: Payer: Self-pay | Admitting: Cardiology

## 2009-10-09 LAB — CONVERTED CEMR LAB: POC INR: 3.8

## 2009-10-30 ENCOUNTER — Ambulatory Visit: Payer: Self-pay | Admitting: Internal Medicine

## 2009-11-12 ENCOUNTER — Telehealth: Payer: Self-pay | Admitting: Internal Medicine

## 2009-11-13 ENCOUNTER — Ambulatory Visit: Payer: Self-pay | Admitting: Cardiology

## 2009-11-14 ENCOUNTER — Encounter: Payer: Self-pay | Admitting: Internal Medicine

## 2009-12-04 ENCOUNTER — Ambulatory Visit: Payer: Self-pay | Admitting: Cardiology

## 2009-12-18 ENCOUNTER — Ambulatory Visit: Payer: Self-pay | Admitting: Internal Medicine

## 2009-12-18 LAB — CONVERTED CEMR LAB: POC INR: 2.8

## 2009-12-30 ENCOUNTER — Ambulatory Visit: Payer: Self-pay | Admitting: Internal Medicine

## 2009-12-30 ENCOUNTER — Ambulatory Visit: Payer: Self-pay | Admitting: Diagnostic Radiology

## 2009-12-30 ENCOUNTER — Ambulatory Visit (HOSPITAL_BASED_OUTPATIENT_CLINIC_OR_DEPARTMENT_OTHER): Admission: RE | Admit: 2009-12-30 | Discharge: 2009-12-30 | Payer: Self-pay | Admitting: Internal Medicine

## 2009-12-30 DIAGNOSIS — R5381 Other malaise: Secondary | ICD-10-CM | POA: Insufficient documentation

## 2009-12-30 DIAGNOSIS — R0602 Shortness of breath: Secondary | ICD-10-CM | POA: Insufficient documentation

## 2009-12-30 DIAGNOSIS — R5383 Other fatigue: Secondary | ICD-10-CM

## 2009-12-31 ENCOUNTER — Encounter: Payer: Self-pay | Admitting: Internal Medicine

## 2009-12-31 ENCOUNTER — Telehealth: Payer: Self-pay | Admitting: Internal Medicine

## 2010-01-01 ENCOUNTER — Telehealth: Payer: Self-pay | Admitting: Internal Medicine

## 2010-01-13 ENCOUNTER — Ambulatory Visit: Payer: Self-pay | Admitting: Cardiovascular Disease

## 2010-01-13 LAB — CONVERTED CEMR LAB: POC INR: 2.7

## 2010-01-31 ENCOUNTER — Ambulatory Visit: Payer: Self-pay | Admitting: Internal Medicine

## 2010-02-05 ENCOUNTER — Ambulatory Visit: Payer: Self-pay | Admitting: Cardiology

## 2010-02-05 LAB — CONVERTED CEMR LAB: POC INR: 4.4

## 2010-02-19 ENCOUNTER — Ambulatory Visit: Payer: Self-pay | Admitting: Cardiology

## 2010-02-19 LAB — CONVERTED CEMR LAB: POC INR: 3.9

## 2010-03-10 ENCOUNTER — Ambulatory Visit: Payer: Self-pay | Admitting: Internal Medicine

## 2010-03-10 LAB — CONVERTED CEMR LAB: POC INR: 4.1

## 2010-03-21 ENCOUNTER — Ambulatory Visit: Payer: Self-pay | Admitting: Cardiology

## 2010-03-21 LAB — CONVERTED CEMR LAB: POC INR: 3.3

## 2010-03-24 ENCOUNTER — Telehealth: Payer: Self-pay | Admitting: Internal Medicine

## 2010-04-10 ENCOUNTER — Ambulatory Visit: Payer: Self-pay | Admitting: Cardiology

## 2010-04-10 LAB — CONVERTED CEMR LAB: POC INR: 3.1

## 2010-05-08 ENCOUNTER — Ambulatory Visit: Payer: Self-pay | Admitting: Cardiovascular Disease

## 2010-05-17 ENCOUNTER — Ambulatory Visit: Payer: Self-pay | Admitting: Internal Medicine

## 2010-05-20 ENCOUNTER — Ambulatory Visit: Payer: Self-pay | Admitting: Internal Medicine

## 2010-05-20 LAB — CONVERTED CEMR LAB: POC INR: 3.4

## 2010-05-21 ENCOUNTER — Ambulatory Visit (HOSPITAL_BASED_OUTPATIENT_CLINIC_OR_DEPARTMENT_OTHER)
Admission: RE | Admit: 2010-05-21 | Discharge: 2010-05-21 | Payer: Self-pay | Source: Home / Self Care | Attending: Internal Medicine | Admitting: Internal Medicine

## 2010-05-21 ENCOUNTER — Ambulatory Visit: Payer: Self-pay | Admitting: Family

## 2010-05-21 LAB — CONVERTED CEMR LAB
Bilirubin Urine: NEGATIVE
Glucose, Urine, Semiquant: NEGATIVE
Specific Gravity, Urine: >=1.03
WBC Urine, dipstick: NEGATIVE
pH: 5

## 2010-06-17 ENCOUNTER — Ambulatory Visit: Admission: RE | Admit: 2010-06-17 | Discharge: 2010-06-17 | Payer: Self-pay | Source: Home / Self Care

## 2010-06-17 LAB — CONVERTED CEMR LAB: POC INR: 4.8

## 2010-06-29 ENCOUNTER — Encounter: Payer: Self-pay | Admitting: *Deleted

## 2010-06-29 ENCOUNTER — Encounter: Payer: Self-pay | Admitting: Internal Medicine

## 2010-07-01 ENCOUNTER — Ambulatory Visit: Admission: RE | Admit: 2010-07-01 | Discharge: 2010-07-01 | Payer: Self-pay | Source: Home / Self Care

## 2010-07-01 LAB — CONVERTED CEMR LAB: POC INR: 3.7

## 2010-07-06 LAB — CONVERTED CEMR LAB
ALT: 26 units/L (ref 0–53)
Albumin: 3.8 g/dL (ref 3.5–5.2)
BUN: 14 mg/dL (ref 6–23)
BUN: 19 mg/dL (ref 6–23)
Basophils Relative: 0.8 % (ref 0.0–3.0)
CO2: 28 meq/L (ref 19–32)
Calcium: 9 mg/dL (ref 8.4–10.5)
Chloride: 105 meq/L (ref 96–112)
Chloride: 113 meq/L — ABNORMAL HIGH (ref 96–112)
Creatinine, Ser: 0.9 mg/dL (ref 0.4–1.5)
Eosinophils Absolute: 0.2 10*3/uL (ref 0.0–0.7)
Eosinophils Relative: 3.7 % (ref 0.0–5.0)
GFR calc non Af Amer: 86.43 mL/min (ref >60)
Glucose, Bld: 102 mg/dL — ABNORMAL HIGH (ref 70–99)
Glucose, Bld: 104 mg/dL — ABNORMAL HIGH (ref 70–99)
HCT: 38.9 % — ABNORMAL LOW (ref 39.0–52.0)
Hemoglobin: 13 g/dL (ref 13.0–17.0)
LDL Cholesterol: 55 mg/dL (ref 0–99)
Lymphocytes Relative: 16 % (ref 12–46)
Lymphs Abs: 1.2 10*3/uL (ref 0.7–4.0)
MCV: 97.5 fL (ref 78.0–100.0)
MCV: 99.5 fL (ref 78.0–100.0)
Monocytes Absolute: 0.6 10*3/uL (ref 0.1–1.0)
Monocytes Relative: 13 % — ABNORMAL HIGH (ref 3–12)
Neutrophils Relative %: 65.1 % (ref 43.0–77.0)
Neutrophils Relative %: 67 % (ref 43–77)
Platelets: 143 10*3/uL — ABNORMAL LOW (ref 150.0–400.0)
Platelets: 176 10*3/uL (ref 150–400)
Potassium: 4.4 meq/L (ref 3.5–5.1)
Potassium: 5.2 meq/L (ref 3.5–5.3)
RBC: 3.91 M/uL — ABNORMAL LOW (ref 4.22–5.81)
RBC: 4.02 M/uL — ABNORMAL LOW (ref 4.22–5.81)
Sodium: 140 meq/L (ref 135–145)
Sodium: 146 meq/L — ABNORMAL HIGH (ref 135–145)
WBC: 5.8 10*3/uL (ref 4.5–10.5)
WBC: 7.5 10*3/uL (ref 4.0–10.5)

## 2010-07-08 NOTE — Progress Notes (Signed)
Summary: Lab Results  Phone Note Outgoing Call   Summary of Call: call pt - labs are normal.  I will send lab letter summarizing results Initial call taken by: D. Thomos Lemons DO,  December 31, 2009 1:49 PM  Follow-up for Phone Call        call placed to patient at [phone]. He was informed per Dr Artist Pais instructions Follow-up by: Glendell Docker CMA,  December 31, 2009 2:00 PM

## 2010-07-08 NOTE — Cardiovascular Report (Signed)
Summary: Office Visit   Office Visit   Imported By: Roderic Ovens 07/10/2009 16:03:26  _____________________________________________________________________  External Attachment:    Type:   Image     Comment:   External Document

## 2010-07-08 NOTE — Medication Information (Signed)
Summary: coumadin/mt  Anticoagulant Therapy  Managed by: Weston Brass, PharmD Referring MD: B. Crenshaw PCP: Dondra Spry DO Supervising MD: Clifton James MD, Cristal Deer Indication 1: Aortic Valve Replacement (ICD-V43.3) Indication 2: Atrial Fibrillation (ICD-427.31) Lab Used: [facility] Site: [facility] INR POC 2.7 INR RANGE 2.5 - 3.5  Dietary changes: no    Health status changes: no    Bleeding/hemorrhagic complications: no    Recent/future hospitalizations: no    Any changes in medication regimen? no    Recent/future dental: no  Any missed doses?: no       Is patient compliant with meds? yes       Allergies: No Known Drug Allergies  Anticoagulation Management History:      The patient is taking warfarin and comes in today for a routine follow up visit.  Positive risk factors for bleeding include an age of 75 years or older.  The bleeding index is 'intermediate risk'.  Positive CHADS2 values include History of CHF, History of HTN, and Age > 6 years old.  The start date was 10/08/1997.  Anticoagulation responsible provider: Clifton James MD, Cristal Deer.  INR POC: 2.7.  Cuvette Lot#: [id number].  Exp: 03/2011.    Anticoagulation Management Assessment/Plan:      The patient's current anticoagulation dose is Coumadin 5 mg  tabs: use asd.  The target INR is 2.5-3.5.  The next INR is due 02/11/2010.  Anticoagulation instructions were given to patient.  Results were reviewed/authorized by Weston Brass, PharmD.  He was notified by Liana Gerold, PharmD Candidate.         Prior Anticoagulation Instructions: INR 2.8 Continue 1 pill everyday except 1/2 pill on Sundays and Thursdays. Recheck in 3 weeks.   Current Anticoagulation Instructions: INR 2.7  Continue taking 1 tablet daily except for 1/2 tablet  on Sunday and Thursday.  Return to clinic in 4 weeks.

## 2010-07-08 NOTE — Medication Information (Signed)
Summary: rov/sl  Anticoagulant Therapy  Managed by: Lyna Poser, PharmD Referring MD: B. Crenshaw PCP: Dondra Spry DO Supervising MD: Daleen Squibb MD, Maisie Fus Indication 1: Aortic Valve Replacement (ICD-V43.3) Indication 2: Atrial Fibrillation (ICD-427.31) Lab Used: [facility] Site: [address] INR POC 3.1 INR RANGE 2.5 - 3.5  Dietary changes: no    Health status changes: no    Bleeding/hemorrhagic complications: no    Recent/future hospitalizations: yes       Details: had eye surgery last week   Any changes in medication regimen? no    Recent/future dental: no  Any missed doses?: no       Is patient compliant with meds? yes       Allergies: No Known Drug Allergies  Anticoagulation Management History:      The patient is taking warfarin and comes in today for a routine follow up visit.  Positive risk factors for bleeding include an age of 75 years or older.  The bleeding index is 'intermediate risk'.  Positive CHADS2 values include History of CHF, History of HTN, and Age > 73 years old.  The start date was 10/08/1997.  Anticoagulation responsible provider: Daleen Squibb MD, Maisie Fus.  INR POC: 3.1.  Cuvette Lot#: [id number].  Exp: 04/2011.    Anticoagulation Management Assessment/Plan:      The patient's current anticoagulation dose is Coumadin 5 mg  tabs: use asd, Coumadin 5 mg tabs: Sunday - 0.5 tab, Monday - 1 tab, Tuesday - 0.5 tab, Wednesday - 1 tab, Thursday - 0.5 tab, Friday - 1 tab, Saturday - 0.5 tab.  The target INR is 2.5-3.5.  The next INR is due 05/08/2010.  Anticoagulation instructions were given to patient.  Results were reviewed/authorized by Lyna Poser, PharmD.         Prior Anticoagulation Instructions: INR 3.3  Continue Coumadin 0.5 tab (2.5 mg) on Sun, Tues, Thur, Sat and Coumadin 1 tab (5 mg) on Mon, Wed, Fri. Return to clinic in 2 weeks.   Current Anticoagulation Instructions: INR 3.1 Continue taking 1 tablet on monday, wednesday, and friday. And a half tablet  all other days. Recheck in 4 weeks.

## 2010-07-08 NOTE — Letter (Signed)
   [facility] [address] [place], [place]  [address]  [place] Phone: [phone]      December 31, 2009   [facility] [address] [place], [place] [address]  RE:  LAB RESULTS  Dear  Mr. GOLDRING,  The following is an interpretation of your most recent lab tests.  Please take note of any instructions provided or changes to medications that have resulted from your lab work.  ELECTROLYTES:  Good - no changes needed  KIDNEY FUNCTION TESTS:  Good - no changes needed   THYROID STUDIES:  Thyroid studies normal TSH: 1.443     CBC:  Good - no changes needed  BNP (heart failure blood test) - negative       Sincerely Yours,    Dr. Thomos Lemons

## 2010-07-08 NOTE — Medication Information (Signed)
Summary: rov/sp  Anticoagulant Therapy  Managed by: Bethena Midget, RN, BSN Referring MD: B. Crenshaw PCP: Dondra Spry DO Supervising MD: Riley Kill MD, Maisie Fus Indication 1: Aortic Valve Replacement (ICD-V43.3) Indication 2: Atrial Fibrillation (ICD-427.31) Lab Used: [facility] Site: [facility] INR POC 3.8 INR RANGE 2.5 - 3.5  Dietary changes: no    Health status changes: no    Bleeding/hemorrhagic complications: no    Recent/future hospitalizations: no    Any changes in medication regimen? no    Recent/future dental: no  Any missed doses?: no       Is patient compliant with meds? yes       Allergies: No Known Drug Allergies  Anticoagulation Management History:      The patient is taking warfarin and comes in today for a routine follow up visit.  Positive risk factors for bleeding include an age of 75 years or older.  The bleeding index is 'intermediate risk'.  Positive CHADS2 values include History of CHF, History of HTN, and Age > 20 years old.  The start date was 10/08/1997.  Anticoagulation responsible provider: Riley Kill MD, Maisie Fus.  INR POC: 3.8.  Cuvette Lot#: [id number].  Exp: 01/2011.    Anticoagulation Management Assessment/Plan:      The patient's current anticoagulation dose is Coumadin 5 mg  tabs: use asd.  The target INR is 2.5-3.5.  The next INR is due 12/18/2009.  Anticoagulation instructions were given to patient.  Results were reviewed/authorized by Bethena Midget, RN, BSN.  He was notified by Bethena Midget, RN, BSN.         Prior Anticoagulation Instructions: INR 2.8  Continue same dose of 1 tablet every day except 1/2 tablet on Sunday and Thursday  Current Anticoagulation Instructions: INR 3.8 Skip tomorrow's dose then resume 1 pill everyday except 1/2 pill on Sundays and Thursdays. Recheck in 2 weeks.

## 2010-07-08 NOTE — Progress Notes (Signed)
Summary: Lorazepam refill  Phone Note Refill Request Message from:  Fax from Pharmacy on March 24, 2010 10:27 AM  Refills Requested: Medication #1:  LORAZEPAM 1 MG  TABS Take 1/2  tablet by mouth once a day as needed   Dosage confirmed as above?Dosage Confirmed   Brand Name Necessary? No   Supply Requested: 1 month  Method Requested: Electronic Initial call taken by: Lannette Donath,  March 24, 2010 10:28 AM  Follow-up for Phone Call        ok to refill x 3 Follow-up by: D. Thomos Lemons DO,  March 24, 2010 12:01 PM  Additional Follow-up for Phone Call Additional follow up Details #1::        Rx called to pharmacist  Additional Follow-up by: Glendell Docker CMA,  March 24, 2010 1:20 PM    Prescriptions: LORAZEPAM 1 MG  TABS (LORAZEPAM) Take 1/2  tablet by mouth once a day as needed  #30 x 3   Entered by:   Glendell Docker CMA   Authorized by:   D. Thomos Lemons DO   Signed by:   Glendell Docker CMA on 03/24/2010   Method used:   Telephoned to ...       [id number] (retail)       [address]       [place], [place]  [address]       Ph: [phone]       Fax: [phone]   RxID:   [id number]

## 2010-07-08 NOTE — Medication Information (Signed)
Summary: rov/kh  Anticoagulant Therapy  Managed by: Reina Fuse, PharmD Referring MD: B. Crenshaw PCP: Dondra Spry DO Supervising MD: Riley Kill MD, Maisie Fus Indication 1: Aortic Valve Replacement (ICD-V43.3) Indication 2: Atrial Fibrillation (ICD-427.31) Lab Used: [facility] Site: [facility] INR POC 3.3 INR RANGE 2.5 - 3.5  Dietary changes: no    Health status changes: no    Bleeding/hemorrhagic complications: no    Recent/future hospitalizations: no    Any changes in medication regimen? no    Recent/future dental: no  Any missed doses?: no       Is patient compliant with meds? yes      Comments: Black eye and blood in eye from recent "hole in retina". Patched by eye doctor, healing now.   Current Medications (verified): 1)  Coumadin 5 Mg  Tabs (Warfarin Sodium) .... Use Asd 2)  Lanoxin 0.125 Mg  Tabs (Digoxin) .... Once Daily  (Pt Has 0.25 On List Not Sure of Dose) 3)  Simvastatin 20 Mg Tabs (Simvastatin) .... One By Mouth Once Daily 4)  Lorazepam 1 Mg  Tabs (Lorazepam) .... Take 1/2  Tablet By Mouth Once A Day As Needed 5)  Carvedilol 25 Mg Tabs (Carvedilol) .... One By Mouth Two Times A Day 6)  Eye Vite .... Once Daily 7)  Diovan 160 Mg Tabs (Valsartan) .... One By Mouth Qd 8)  Finasteride 5 Mg Tabs (Finasteride) .... One By Mouth Once Daily 9)  Amlodipine Besylate 5 Mg Tabs (Amlodipine Besylate) .... Take 1 Tablet Daily. 10)  Coumadin 5 Mg Tabs (Warfarin Sodium) .... Sunday - 0.5 Tab, Monday - 1 Tab, Tuesday - 0.5 Tab, Wednesday - 1 Tab, Thursday - 0.5 Tab, Friday - 1 Tab, Saturday - 0.5 Tab  Allergies (verified): No Known Drug Allergies  Anticoagulation Management History:      The patient is taking warfarin and comes in today for a routine follow up visit.  Positive risk factors for bleeding include an age of 75 years or older.  The bleeding index is 'intermediate risk'.  Positive CHADS2 values include History of CHF, History of HTN, and Age > 37 years old.  The  start date was 10/08/1997.  Anticoagulation responsible provider: Riley Kill MD, Maisie Fus.  INR POC: 3.3.  Cuvette Lot#: [id number].  Exp: 04/2011.    Anticoagulation Management Assessment/Plan:      The patient's current anticoagulation dose is Coumadin 5 mg  tabs: use asd, Coumadin 5 mg tabs: Sunday - 0.5 tab, Monday - 1 tab, Tuesday - 0.5 tab, Wednesday - 1 tab, Thursday - 0.5 tab, Friday - 1 tab, Saturday - 0.5 tab.  The target INR is 2.5-3.5.  The next INR is due 04/04/2010.  Anticoagulation instructions were given to patient.  Results were reviewed/authorized by Reina Fuse, PharmD.  He was notified by Reina Fuse PharmD.         Prior Anticoagulation Instructions: INR 4.1  Do not take dose tomorrow (Tuesday, October 4). New dosing schedule: Take 1/2 tablet (2.5mg ) on Sunday, Tuesday, Thursday, Saturday and 1 tablet (5mg ) on Monday, Wednesday, Friday.  Current Anticoagulation Instructions: INR 3.3  Continue Coumadin 0.5 tab (2.5 mg) on Sun, Tues, Thur, Sat and Coumadin 1 tab (5 mg) on Mon, Wed, Fri. Return to clinic in 2 weeks.

## 2010-07-08 NOTE — Cardiovascular Report (Signed)
Summary: Office Visit   Office Visit   Imported By: Roderic Ovens 09/13/2009 13:30:45  _____________________________________________________________________  External Attachment:    Type:   Image     Comment:   External Document

## 2010-07-08 NOTE — Cardiovascular Report (Signed)
Summary: TTM   TTM   Imported By: Roderic Ovens 07/03/2009 10:40:32  _____________________________________________________________________  External Attachment:    Type:   Image     Comment:   External Document

## 2010-07-08 NOTE — Medication Information (Signed)
Summary: rov/eh  Anticoagulant Therapy  Managed by: Cloyde Reams, RN, BSN Referring MD: B. Crenshaw PCP: Dondra Spry DO Supervising MD: Gala Romney MD, Reuel Boom Indication 1: Aortic Valve Replacement (ICD-V43.3) Indication 2: Atrial Fibrillation (ICD-427.31) Lab Used: [facility] Site: [facility] INR POC 2.5 INR RANGE 2.5 - 3.5  Dietary changes: no    Health status changes: yes       Details: Still has some residual chest congestion from cold x 1 month.    Bleeding/hemorrhagic complications: no    Recent/future hospitalizations: no    Any changes in medication regimen? yes       Details: Completed last course of meds for cold approx 1 week ago.    Recent/future dental: no  Any missed doses?: no       Is patient compliant with meds? yes       Allergies (verified): No Known Drug Allergies  Anticoagulation Management History:      The patient is taking warfarin and comes in today for a routine follow up visit.  Positive risk factors for bleeding include an age of 58 years or older.  The bleeding index is 'intermediate risk'.  Positive CHADS2 values include History of CHF, History of HTN, and Age > 63 years old.  The start date was 10/08/1997.  Anticoagulation responsible provider: Regena Delucchi MD, Reuel Boom.  INR POC: 2.5.  Cuvette Lot#: [id number].  Exp: 09/2010.    Anticoagulation Management Assessment/Plan:      The patient's current anticoagulation dose is Coumadin 5 mg  tabs: use asd.  The target INR is 2.5-3.5.  The next INR is due 08/02/2009.  Anticoagulation instructions were given to patient.  Results were reviewed/authorized by Cloyde Reams, RN, BSN.  He was notified by Cloyde Reams RN.         Prior Anticoagulation Instructions: INR 2.3  Take an extra 1/2 tablet today then continue same dose of 1 tablet daily except 1/2 tablet Sundays and Thursdays. Recheck in 3 weeks.  Current Anticoagulation Instructions: INR 2.5  Continue on same dosage 1 tablet daily except 1/2  tablet on Sundays and Thursdays.  Recheck in 4 weeks.

## 2010-07-08 NOTE — Medication Information (Signed)
Summary: rov/eac  Anticoagulant Therapy  Managed by: Weston Brass, PharmD Referring MD: B. Crenshaw PCP: Dondra Spry DO Supervising MD: Juanda Chance MD, Janyth Riera Indication 1: Aortic Valve Replacement (ICD-V43.3) Indication 2: Atrial Fibrillation (ICD-427.31) Lab Used: [facility] Site: [facility] INR POC 2.8 INR RANGE 2.5 - 3.5  Vital Signs: Blood Pressure:  130 / 80   Dietary changes: no    Health status changes: yes       Details: pt says he is feeling "swimmy headed" last few days.  He will get dizzy and weak after doing some type of activity.  He has not fallen, no vision changes, no CP.  He does report some coughing and congestion lately.  He did state he has discussed these with Dr. Jens Som in the past   Bleeding/hemorrhagic complications: no    Recent/future hospitalizations: no    Any changes in medication regimen? no    Recent/future dental: no  Any missed doses?: no       Is patient compliant with meds? yes       Allergies: No Known Drug Allergies  Anticoagulation Management History:      The patient is taking warfarin and comes in today for a routine follow up visit.  Positive risk factors for bleeding include an age of 43 years or older.  The bleeding index is 'intermediate risk'.  Positive CHADS2 values include History of CHF, History of HTN, and Age > 67 years old.  The start date was 10/08/1997.  Anticoagulation responsible provider: Juanda Chance MD, Smitty Cords.  INR POC: 2.8.  Cuvette Lot#: [id number].  Exp: 01/2011.    Anticoagulation Management Assessment/Plan:      The patient's current anticoagulation dose is Coumadin 5 mg  tabs: use asd.  The target INR is 2.5-3.5.  The next INR is due 12/04/2009.  Anticoagulation instructions were given to patient.  Results were reviewed/authorized by Weston Brass, PharmD.  He was notified by Weston Brass PharmD.         Prior Anticoagulation Instructions: INR 4.0  Do NOT take coumadin tomorrow, then take only 1/2 tablet on Friday.  Then  return to taking 1/2 tablet on Sunday adn Thursday and 1 tablet all other days.  Return to clinic  in 2 weeks.      Current Anticoagulation Instructions: INR 2.8  Continue same dose of 1 tablet every day except 1/2 tablet on Sunday and Thursday

## 2010-07-08 NOTE — Assessment & Plan Note (Signed)
Summary: follow up / tf,cma   Vital Signs:  Patient profile:   77 year old male Height:      67 inches Weight:      185 pounds BMI:     29.08 O2 Sat:      96 % on Room air Temp:     98.3 degrees F oral Pulse rate:   63 / minute Pulse rhythm:   regular Resp:     18 per minute BP sitting:   120 / 70  (left arm) Cuff size:   large  Vitals Entered By: Glendell Docker CMA (December 30, 2009 10:43 AM)  O2 Flow:  Room air CC: Rm 3- Cough Is Patient Diabetic? No Pain Assessment Patient in pain? no      Comments requesting refill on Finastride, and rx cough syrup, productive  cough  since last office visit, sweats ,taken  over the counter cough relief with little relief, denies temp, c/o being off balance, and weak   Primary Care Provider:  DThomos Lemons DO  CC:  Rm 3- Cough.  History of Present Illness: 77 y/o white male for fu overall doing well diarrhea from c diff resovled   he reports episode of sharp left sternal chest pain after taking a shower daugther reports he "gives out quicker" dyspnea with exertion  Preventive Screening-Counseling & Management  Alcohol-Tobacco     Smoking Status: quit  EKG  Procedure date:  12/30/2009  Findings:      Electronic ventribular pacemaker - rate 61 bpm  Allergies (verified): No Known Drug Allergies  Past History:  Past Medical History: COPD Coronary artery disease   Hyperlipidemia   Hypertension  Osteoarthritis  Benign prostatic hypertrophy  Low back pain/lumbar disc disease Congestive heart failure Atrial fibrillation - permanent Colonic polyps, hx of Diverticulosis, colon  Anxiety  GERD    Peripheral vascular disease  Transient ischemic attack, hx of C. Diff colitis  Recurrent diarrhea   Past Surgical History: Coronary artery bypass graft Permanent pacemaker AAA repair with intraluminal graft  s/p aortic valve replacement            Family History: Family History Hypertension - mother father died with  stroke sister died with stroke Wife diagnosed with advanced bladder cancer - deceased            Social History: Former Smoker   Alcohol use-no Widowed            Physical Exam  General:  alert, well-developed, and well-nourished.   Head:  normocephalic and atraumatic.   Neck:  supple and no masses.   Lungs:  normal respiratory effort and normal breath sounds.  no crackles and no wheezes.   Heart:  normal rate, regular rhythm, and no gallop.   Extremities:  No lower extremity edema  Neurologic:  cranial nerves II-XII intact.  ambulates with walker Psych:  normally interactive, good eye contact, not anxious appearing, and not depressed appearing.     Impression & Recommendations:  Problem # 1:  DYSPNEA (ICD-786.05) EKG shows paced rhythm.  I doubt ischemia.  CXR neg for acute changes.  probable COPD Orders: T-2 View CXR, Same Day (71020.5TC) T-Basic Metabolic Panel [id number]) T-CBC w/Diff [id number]) T-TSH [id number]) T-T4, Free [phone]) T-BNP  (B Natriuretic Peptide) ([id number]) EKG w/ Interpretation (93000)  Problem # 2:  HYPERTENSION (ICD-401.9)  His updated medication list for this problem includes:    Carvedilol 25 Mg Tabs (Carvedilol) ..... One by mouth two times a day  Diovan 160 Mg Tabs (Valsartan) ..... One by mouth qd    Amlodipine Besylate 5 Mg Tabs (Amlodipine besylate) .Marland Kitchen... Take 1 tablet daily.  BP today: 120/70 Prior BP: 130/80 (11/13/2009)  Labs Reviewed: K+: 4.8 (02/25/2009) Creat: : 1.07 (02/25/2009)   Chol: 99 (05/17/2009)   HDL: 28.70 (05/17/2009)   LDL: 51 (05/17/2009)   TG: 99.0 (05/17/2009)  Problem # 3:  BENIGN PROSTATIC HYPERTROPHY (ICD-600.00)  His updated medication list for this problem includes:    Finasteride 5 Mg Tabs (Finasteride) ..... One by mouth once daily  Complete Medication List: 1)  Coumadin 5 Mg Tabs (Warfarin sodium) .... Use asd 2)  Lanoxin 0.125 Mg Tabs (Digoxin) .... Once daily 3)  Simvastatin 20 Mg  Tabs (Simvastatin) .... One by mouth once daily 4)  Lorazepam 1 Mg Tabs (Lorazepam) .... Take 1/2  tablet by mouth once a day as needed 5)  Carvedilol 25 Mg Tabs (Carvedilol) .... One by mouth two times a day 6)  Eye Vite  .... Once daily 7)  Diovan 160 Mg Tabs (Valsartan) .... One by mouth qd 8)  Finasteride 5 Mg Tabs (Finasteride) .... One by mouth once daily 9)  Amlodipine Besylate 5 Mg Tabs (Amlodipine besylate) .... Take 1 tablet daily.  Patient Instructions: 1)  Please schedule a follow-up appointment in 6 months. Prescriptions: FINASTERIDE 5 MG TABS (FINASTERIDE) one by mouth once daily  #30 Tablet x 5   Entered and Authorized by:   D. Thomos Lemons DO   Signed by:   D. Thomos Lemons DO on 12/30/2009   Method used:   Faxed to ...       [id number] (retail)       [address]       [place], [place]  [address]       Ph: [phone]       Fax: [phone]   RxID:   [id number] SIMVASTATIN 20 MG TABS (SIMVASTATIN) one by mouth once daily  #30 x 11   Entered and Authorized by:   D. Thomos Lemons DO   Signed by:   D. Thomos Lemons DO on 12/30/2009   Method used:   Faxed to ...       [id number] (retail)       [address]       [place], [place]  [address]       Ph: [phone]       Fax: [phone]   RxID:   [id number]   Current Allergies (reviewed today): No known allergies

## 2010-07-08 NOTE — Medication Information (Signed)
Summary: rov/kh  Anticoagulant Therapy  Managed by: Tammy Sours PharmD Referring MD: B. Crenshaw PCP: Dondra Spry DO Supervising MD: Gala Romney MD, Reuel Boom Indication 1: Aortic Valve Replacement (ICD-V43.3) Indication 2: Atrial Fibrillation (ICD-427.31) Lab Used: [facility] Site: [facility] INR POC 4.1 INR RANGE 2.5 - 3.5  Dietary changes: no    Health status changes: no    Bleeding/hemorrhagic complications: no    Recent/future hospitalizations: no    Any changes in medication regimen? no    Recent/future dental: no  Any missed doses?: no       Is patient compliant with meds? yes      Comments: Patient scheduled to have eye surgery on Wednesday   Allergies: No Known Drug Allergies  Anticoagulation Management History:      The patient is taking warfarin and comes in today for a routine follow up visit.  Positive risk factors for bleeding include an age of 75 years or older.  The bleeding index is 'intermediate risk'.  Positive CHADS2 values include History of CHF, History of HTN, and Age > 7 years old.  The start date was 10/08/1997.  Anticoagulation responsible provider: Bensimhon MD, Reuel Boom.  INR POC: 4.1.  Cuvette Lot#: [id number].  Exp: 04/2011.    Anticoagulation Management Assessment/Plan:      The patient's current anticoagulation dose is Coumadin 5 mg  tabs: use asd, Coumadin 5 mg tabs: Sunday - 0.5 tab, Monday - 1 tab, Tuesday - 0.5 tab, Wednesday - 1 tab, Thursday - 0.5 tab, Friday - 1 tab, Saturday - 0.5 tab.  The target INR is 2.5-3.5.  The next INR is due 03/24/2010.  Anticoagulation instructions were given to patient.  Results were reviewed/authorized by Tammy Sours PharmD.         Prior Anticoagulation Instructions: INR 3.9  Do NOT take tomorrow's (Thursday 9/15) dose.  On Friday (9/16) take one-half tablet.  On Saturday begin the following dose: one tablet every day except one-half tablet on Sunday, Tuesday, and Thursday.  Return to clinic in 2  weeks.    Current Anticoagulation Instructions: INR 4.1  Do not take dose tomorrow (Tuesday, October 4). New dosing schedule: Take 1/2 tablet (2.5mg ) on Sunday, Tuesday, Thursday, Saturday and 1 tablet (5mg ) on Monday, Wednesday, Friday.

## 2010-07-08 NOTE — Progress Notes (Signed)
Summary: Diovan Rx  Phone Note Call from Patient   Caller: Patient Call For: [name] Summary of Call: got samples of Diovan.  It is working well.  He needs rx to [facility] [phone] for a 90 day supply  Initial call taken by: Roselle Locus,  August 27, 2009 1:06 PM  Follow-up for Phone Call        Phone Call Completed, Rx sent electronically to pharmacy Follow-up by: Glendell Docker CMA,  August 27, 2009 1:16 PM    Prescriptions: DIOVAN 160 MG TABS (VALSARTAN) one by mouth qd  #90 x 3   Entered by:   Glendell Docker CMA   Authorized by:   D. Thomos Lemons DO   Signed by:   Glendell Docker CMA on 08/27/2009   Method used:   Electronically to        [facility] [address] (retail)       [address] [place], [place]  [address]       Ph: [phone] or [phone]       Fax: [phone]   RxID:   [id number]

## 2010-07-08 NOTE — Medication Information (Signed)
Summary: rov/ewj  Anticoagulant Therapy  Managed by: Bethena Midget, RN, BSN Referring MD: B. Crenshaw PCP: Dondra Spry DO Supervising MD: Graciela Husbands MD, Viviann Spare Indication 1: Aortic Valve Replacement (ICD-V43.3) Indication 2: Atrial Fibrillation (ICD-427.31) Lab Used: [facility] Site: [facility] INR POC 2.1 INR RANGE 2.5 - 3.5  Dietary changes: no    Health status changes: no    Bleeding/hemorrhagic complications: no    Recent/future hospitalizations: no    Any changes in medication regimen? no    Recent/future dental: no  Any missed doses?: no       Is patient compliant with meds? yes      Comments: Seeing Dr.Taylor today  Allergies (verified): No Known Drug Allergies  Anticoagulation Management History:      The patient is taking warfarin and comes in today for a routine follow up visit.  Positive risk factors for bleeding include an age of 75 years or older.  The bleeding index is 'intermediate risk'.  Positive CHADS2 values include History of CHF, History of HTN, and Age > 75 years old.  The start date was 10/08/1997.  Anticoagulation responsible provider: Graciela Husbands MD, Viviann Spare.  INR POC: 2.1.  Cuvette Lot#: [id number].  Exp: 10/2010.    Anticoagulation Management Assessment/Plan:      The patient's current anticoagulation dose is Coumadin 5 mg  tabs: use asd.  The target INR is 2.5-3.5.  The next INR is due 09/20/2009.  Anticoagulation instructions were given to patient.  Results were reviewed/authorized by Bethena Midget, RN, BSN.  He was notified by Bethena Midget, RN, BSN.         Prior Anticoagulation Instructions: INR 2.8  Continue on same dosage 1 tablet daily except 1/2 tablet on Sundays and Thursdays.  Recheck in 4 weeks.    Current Anticoagulation Instructions: INR 2.1 Today take extra 1/2 pill  then resume 1 pill everyday except 1/2pill on Sundays and Thursdays, Recheck in 2 weeks.

## 2010-07-08 NOTE — Medication Information (Signed)
Summary: rov/tm  Anticoagulant Therapy  Managed by: Weston Brass, PharmD Referring MD: B. Crenshaw PCP: Dondra Spry DO Supervising MD: Myrtis Ser MD, Tinnie Gens Indication 1: Aortic Valve Replacement (ICD-V43.3) Indication 2: Atrial Fibrillation (ICD-427.31) Lab Used: [facility] Site: [facility] INR POC 3.9 INR RANGE 2.5 - 3.5  Dietary changes: no    Health status changes: no    Bleeding/hemorrhagic complications: no    Recent/future hospitalizations: no    Any changes in medication regimen? no    Recent/future dental: no  Any missed doses?: no       Is patient compliant with meds? yes       Allergies: No Known Drug Allergies  Anticoagulation Management History:      The patient is taking warfarin and comes in today for a routine follow up visit.  Positive risk factors for bleeding include an age of 75 years or older.  The bleeding index is 'intermediate risk'.  Positive CHADS2 values include History of CHF, History of HTN, and Age > 75 years old.  The start date was 10/08/1997.  Anticoagulation responsible provider: Myrtis Ser MD, Tinnie Gens.  INR POC: 3.9.  Cuvette Lot#: [id number].  Exp: 04/2011.    Anticoagulation Management Assessment/Plan:      The patient's current anticoagulation dose is Coumadin 5 mg  tabs: use asd.  The target INR is 2.5-3.5.  The next INR is due 03/05/2010.  Anticoagulation instructions were given to patient.  Results were reviewed/authorized by Weston Brass, PharmD.  He was notified by Kennieth Francois.         Prior Anticoagulation Instructions: INR 4.4 Skip Thursday's dose.  On Friday only take 1/2 pill. Then resume 1 pill everyday except 1/2 pill on Sundays and Thursdays. Recheck in 2 weeks.   Current Anticoagulation Instructions: INR 3.9  Do NOT take tomorrow's (Thursday 9/15) dose.  On Friday (9/16) take one-half tablet.  On Saturday begin the following dose: one tablet every day except one-half tablet on Sunday, Tuesday, and Thursday.  Return to clinic in 2  weeks.

## 2010-07-08 NOTE — Medication Information (Signed)
Summary: rov/ewj  Anticoagulant Therapy  Managed by: Weston Brass, PharmD Referring MD: B. Crenshaw PCP: Dondra Spry DO Supervising MD: Antoine Poche MD, Fayrene Fearing Indication 1: Aortic Valve Replacement (ICD-V43.3) Indication 2: Atrial Fibrillation (ICD-427.31) Lab Used: [facility] Site: [facility] INR POC 2.3 INR RANGE 2.5 - 3.5  Dietary changes: no    Health status changes: no    Bleeding/hemorrhagic complications: no    Recent/future hospitalizations: no    Any changes in medication regimen? no    Recent/future dental: no  Any missed doses?: no       Is patient compliant with meds? yes       Allergies: No Known Drug Allergies  Anticoagulation Management History:      The patient is taking warfarin and comes in today for a routine follow up visit.  Positive risk factors for bleeding include an age of 75 years or older.  The bleeding index is 'intermediate risk'.  Positive CHADS2 values include History of CHF, History of HTN, and Age > 54 years old.  The start date was 10/08/1997.  Anticoagulation responsible provider: Antoine Poche MD, Fayrene Fearing.  INR POC: 2.3.  Cuvette Lot#: [id number].  Exp: 07/2010.    Anticoagulation Management Assessment/Plan:      The patient's current anticoagulation dose is Coumadin 5 mg  tabs: use asd.  The target INR is 2.5-3.5.  The next INR is due 07/05/2009.  Anticoagulation instructions were given to patient.  Results were reviewed/authorized by Weston Brass, PharmD.  He was notified by Lew Dawes, PharmD Candidate.         Prior Anticoagulation Instructions: INR 3.0  Continue on same dosage 1 tablet daily except 1/2 tablet on Sundays and Thursdays.    Current Anticoagulation Instructions: INR 2.3  Take an extra 1/2 tablet today then continue same dose of 1 tablet daily except 1/2 tablet Sundays and Thursdays. Recheck in 3 weeks.

## 2010-07-08 NOTE — Medication Information (Signed)
Summary: rov/sp  Anticoagulant Therapy  Managed by: Bethena Midget, RN, BSN Referring MD: B. Crenshaw PCP: Dondra Spry DO Supervising MD: Myrtis Ser MD, Tinnie Gens Indication 1: Aortic Valve Replacement (ICD-V43.3) Indication 2: Atrial Fibrillation (ICD-427.31) Lab Used: [facility] Site: [facility] INR POC 4.4 INR RANGE 2.5 - 3.5  Dietary changes: no    Health status changes: no    Bleeding/hemorrhagic complications: no    Recent/future hospitalizations: no    Any changes in medication regimen? no    Recent/future dental: no  Any missed doses?: no       Is patient compliant with meds? yes      Comments: Saw Dr Jens Som today.  4  Allergies: No Known Drug Allergies  Anticoagulation Management History:      The patient is taking warfarin and comes in today for a routine follow up visit.  Positive risk factors for bleeding include an age of 75 years or older.  The bleeding index is 'intermediate risk'.  Positive CHADS2 values include History of CHF, History of HTN, and Age > 75 years old.  The start date was 10/08/1997.  Anticoagulation responsible provider: Myrtis Ser MD, Tinnie Gens.  INR POC: 4.4.  Cuvette Lot#: [id number].  Exp: 03/2011.    Anticoagulation Management Assessment/Plan:      The patient's current anticoagulation dose is Coumadin 5 mg  tabs: use asd.  The target INR is 2.5-3.5.  The next INR is due 02/19/2010.  Anticoagulation instructions were given to patient.  Results were reviewed/authorized by Bethena Midget, RN, BSN.  He was notified by Bethena Midget, RN, BSN.         Prior Anticoagulation Instructions: INR 2.7  Continue taking 1 tablet daily except for 1/2 tablet  on Sunday and Thursday.  Return to clinic in 4 weeks.    Current Anticoagulation Instructions: INR 4.4 Skip Thursday's dose.  On Friday only take 1/2 pill. Then resume 1 pill everyday except 1/2 pill on Sundays and Thursdays. Recheck in 2 weeks.

## 2010-07-08 NOTE — Cardiovascular Report (Signed)
Summary: TTM   TTM   Imported By: Roderic Ovens 02/24/2010 11:08:12  _____________________________________________________________________  External Attachment:    Type:   Image     Comment:   External Document

## 2010-07-08 NOTE — Cardiovascular Report (Signed)
Summary: TTM   TTM   Imported By: Roderic Ovens 02/06/2010 11:33:40  _____________________________________________________________________  External Attachment:    Type:   Image     Comment:   External Document

## 2010-07-08 NOTE — Progress Notes (Signed)
Summary: refill--Lorazepam  Phone Note Refill Request Message from:  Fax from [facility]  [phone] on November 12, 2009 3:51 PM  Refills Requested: Medication #1:  LORAZEPAM 1 MG  TABS Take 1/2  tablet by mouth once a day as needed   Dosage confirmed as above?Dosage Confirmed   Supply Requested: 1 month Received fax from pharmacy for Lorazepam.  Please advise.  Pt was due for f/u in April.    Next Appointment Scheduled: None Initial call taken by: Mervin Kung CMA,  November 12, 2009 3:53 PM  Follow-up for Phone Call        ok to refill x 2 needs OV for additional refills if needed Follow-up by: D. Thomos Lemons DO,  November 12, 2009 5:10 PM  Additional Follow-up for Phone Call Additional follow up Details #1::        Refill left on pharmacy voicemail.  Left message on machine for pt. to return my call.  Needs to schedule follow up with Dr. Artist Pais.  Mervin Kung CMA  November 13, 2009 9:24 AM     Additional Follow-up for Phone Call Additional follow up Details #2::    call returned to patient at [phone], no answer, detailed voice message was left informing patient office visit was needed for refills per Dr Artist Pais Follow-up by: Glendell Docker CMA,  November 13, 2009 10:46 AM  Prescriptions: LORAZEPAM 1 MG  TABS (LORAZEPAM) Take 1/2  tablet by mouth once a day as needed  #30 x 1   Entered by:   Mervin Kung CMA   Authorized by:   D. Thomos Lemons DO   Signed by:   Glendell Docker CMA on 11/13/2009   Method used:   Telephoned to ...       [facility] [address] (retail)       [address]       [place], [place]  [address]       Ph: [phone] or [phone]       Fax: [phone]   RxID:   [id number]   Appended Document: refill--Lorazepam Spoke to pt and was notified that rx was called to wrong location. He is now using [facility] in [place].  Spoke to [facility] [place] and gave authorization. Scheduled f/u for pt. on 12/29/09 @ 10:30am with Dr. Artist Pais.

## 2010-07-08 NOTE — Medication Information (Signed)
Summary: rov/tm  Anticoagulant Therapy  Managed by: Cloyde Reams, RN, BSN Referring MD: B. Crenshaw PCP: Dondra Spry DO Supervising MD: Jens Som MD, Arlys John Indication 1: Aortic Valve Replacement (ICD-V43.3) Indication 2: Atrial Fibrillation (ICD-427.31) Lab Used: [facility] Site: [facility] INR POC 2.8 INR RANGE 2.5 - 3.5  Dietary changes: no    Health status changes: yes       Details: Pt states awakens very congested R side of chest.  Bleeding/hemorrhagic complications: no    Recent/future hospitalizations: no    Any changes in medication regimen? no    Recent/future dental: no  Any missed doses?: no       Is patient compliant with meds? yes       Allergies (verified): No Known Drug Allergies  Anticoagulation Management History:      The patient is taking warfarin and comes in today for a routine follow up visit.  Positive risk factors for bleeding include an age of 75 years or older.  The bleeding index is 'intermediate risk'.  Positive CHADS2 values include History of CHF, History of HTN, and Age > 23 years old.  The start date was 10/08/1997.  Anticoagulation responsible provider: Jens Som MD, Arlys John.  INR POC: 2.8.  Cuvette Lot#: [id number].  Exp: 09/2010.    Anticoagulation Management Assessment/Plan:      The patient's current anticoagulation dose is Coumadin 5 mg  tabs: use asd.  The target INR is 2.5-3.5.  The next INR is due 08/30/2009.  Anticoagulation instructions were given to patient.  Results were reviewed/authorized by Cloyde Reams, RN, BSN.  He was notified by Cloyde Reams RN.         Prior Anticoagulation Instructions: INR 2.5  Continue on same dosage 1 tablet daily except 1/2 tablet on Sundays and Thursdays.  Recheck in 4 weeks.    Current Anticoagulation Instructions: INR 2.8  Continue on same dosage 1 tablet daily except 1/2 tablet on Sundays and Thursdays.  Recheck in 4 weeks.

## 2010-07-08 NOTE — Medication Information (Signed)
Summary: rov/mw  Anticoagulant Therapy  Managed by: Lyna Poser, PharmD Referring MD: B. Crenshaw PCP: Dondra Spry DO Supervising MD: Eden Emms MD, Theron Arista Indication 1: Aortic Valve Replacement (ICD-V43.3) Indication 2: Atrial Fibrillation (ICD-427.31) Lab Used: [facility] Site: [facility] INR POC 4.3 INR RANGE 2.5 - 3.5  Dietary changes: no    Health status changes: no    Bleeding/hemorrhagic complications: no    Recent/future hospitalizations: no    Any changes in medication regimen? no    Recent/future dental: no  Any missed doses?: no       Is patient compliant with meds? yes       Allergies: No Known Drug Allergies  Anticoagulation Management History:      The patient is taking warfarin and comes in today for a routine follow up visit.  Positive risk factors for bleeding include an age of 75 years or older.  The bleeding index is 'intermediate risk'.  Positive CHADS2 values include History of CHF, History of HTN, and Age > 36 years old.  The start date was 10/08/1997.  Anticoagulation responsible provider: Eden Emms MD, Theron Arista.  INR POC: 4.3.  Cuvette Lot#: [id number].  Exp: 04/2011.    Anticoagulation Management Assessment/Plan:      The patient's current anticoagulation dose is Coumadin 5 mg  tabs: use asd, Coumadin 5 mg tabs: Sunday - 0.5 tab, Monday - 1 tab, Tuesday - 0.5 tab, Wednesday - 1 tab, Thursday - 0.5 tab, Friday - 1 tab, Saturday - 0.5 tab.  The target INR is 2.5-3.5.  The next INR is due 05/20/2010.  Anticoagulation instructions were given to patient.  Results were reviewed/authorized by Lyna Poser, PharmD.         Prior Anticoagulation Instructions: INR 3.1 Continue taking 1 tablet on monday, wednesday, and friday. And a half tablet all other days. Recheck in 4 weeks.   Current Anticoagulation Instructions: INR 4.3 skip your dose tomorrow and saturday. Then resume normal dosing schedule of 1 tablet on monday, wednesday, and friday. And a half tablet  all other days. Recheck in 2 weeks.

## 2010-07-08 NOTE — Assessment & Plan Note (Signed)
Summary: 2 week follow up/mhf rsc with pt from bump/mhf, resched- jr   Vital Signs:  Patient profile:   77 year old male Weight:      178.50 pounds BMI:     28.06 O2 Sat:      97 % on Room air Temp:     97.7 degrees F oral Pulse rate:   67 / minute Pulse rhythm:   regular Resp:     18 per minute BP sitting:   120 / 60  (left arm) Cuff size:   large  Vitals Entered By: Glendell Docker CMA (June 14, 2009 10:21 AM)  O2 Flow:  Room air  Primary Care Provider:  D. Thomos Lemons DO   History of Present Illness: 77 y/o for follow up.  Diarrhea resolved with flagyl and welchol  c/o difficullty urinating and pain with urination.    Depression - pt hasn't noticed sign change in mood since starting citalopram.  daughter thinks there has been improvement.  Allergies (verified): No Known Drug Allergies  Past History:  Past Medical History: COPD Coronary artery disease  Hyperlipidemia  Hypertension  Osteoarthritis  Benign prostatic hypertrophy  Low back pain/lumbar disc disease Congestive heart failure Atrial fibrillation - permanent Colonic polyps, hx of Diverticulosis, colon  Anxiety  GERD    Peripheral vascular disease  Transient ischemic attack, hx of C. Diff colitis  Recurrent diarrhea   Past Surgical History: Coronary artery bypass graft Permanent pacemaker AAA repair with intraluminal graft  s/p aortic valve replacement          Family History: Family History Hypertension - mother father died with stroke sister died with stroke Wife diagnosed with advanced bladder cancer - deceased           Social History: Former Smoker   Alcohol use-no Widowed            Physical Exam  General:  alert, well-developed, and well-nourished.   Lungs:  normal respiratory effort and normal breath sounds.   Heart:  normal rate, regular rhythm, and no gallop.   Abdomen:  soft, non-tender, and no masses.   Extremities:  No lower extremity edema    Impression &  Recommendations:  Problem # 1:  BENIGN PROSTATIC HYPERTROPHY (ICD-600.00) Pt with difficulty and discomfort with urination.  Urine dip negative.  refer to urology for further eval and tx His updated medication list for this problem includes:    Finasteride 5 Mg Tabs (Finasteride) ..... One by mouth once daily  Orders: Urology Referral (Urology)  Problem # 2:  DIARRHEA (ICD-787.91) Assessment: Improved resolved with abx and welchol  Problem # 3:  ANXIETY (ICD-300.00) Pt hasn't noticed much change.  continue citalopram.  full effect of medication may not be appreciated until 4-6 wks  His updated medication list for this problem includes:    Lorazepam 1 Mg Tabs (Lorazepam) .Marland Kitchen... Take 1/2  tablet by mouth once a day as needed    Citalopram Hydrobromide 20 Mg Tabs (Citalopram hydrobromide) .Marland Kitchen... 1/2 by mouth qam x 7 days, then one by mouth once daily  Complete Medication List: 1)  Coumadin 5 Mg Tabs (Warfarin sodium) .... Use asd 2)  Lanoxin 0.125 Mg Tabs (Digoxin) .... Once daily 3)  Simvastatin 40 Mg Tabs (Simvastatin) .... Once daily 4)  Lorazepam 1 Mg Tabs (Lorazepam) .... Take 1/2  tablet by mouth once a day as needed 5)  Carvedilol 25 Mg Tabs (Carvedilol) .... One by mouth two times a day 6)  Eye  Vite  .... Once daily 7)  Geritol Complete Tabs (Iron-vitamins) .... Tab by mouth once daily 8)  Diovan 160 Mg Tabs (Valsartan) .... One by mouth qd 9)  Citalopram Hydrobromide 20 Mg Tabs (Citalopram hydrobromide) .... 1/2 by mouth qam x 7 days, then one by mouth once daily 10)  Metronidazole 500 Mg Tabs (Metronidazole) .... One by mouth three times a day 11)  Welchol 625 Mg Tabs (Colesevelam hcl) .... 2 tabs two times a day 12)  Finasteride 5 Mg Tabs (Finasteride) .... One by mouth once daily  Patient Instructions: 1)  Please schedule a follow up appointment within 3 months Prescriptions: LORAZEPAM 1 MG  TABS (LORAZEPAM) Take 1/2  tablet by mouth once a day as needed  #30 x 2    Entered and Authorized by:   D. Thomos Lemons DO   Signed by:   D. Thomos Lemons DO on 06/14/2009   Method used:   Print then Give to Patient   RxID:   [id number] FINASTERIDE 5 MG TABS (FINASTERIDE) one by mouth once daily  #30 x 5   Entered and Authorized by:   D. Thomos Lemons DO   Signed by:   D. Thomos Lemons DO on 06/14/2009   Method used:   Electronically to        [facility] [address] (retail)       [address] [place], [place]  [address]       Ph: [phone] or [phone]       Fax: [phone]   RxID:   [id number]   Current Allergies (reviewed today): No known allergies    Patient Instructions: 1)  Please schedule a follow up appointment within 3 months

## 2010-07-08 NOTE — Procedures (Signed)
Summary: ADD ON DEVICE CHECK AMBER   Allergies: No Known Drug Allergies  PPM Specifications Following MD:  Lewayne Bunting, MD     Referring MD:  [facility] PPM Vendor:  [employer]     PPM Model Number:  [id number]     PPM Serial Number:  [id number] PPM DOI:  01/30/2005      Lead 1    Location: RA     DOI: 04/23/1992     Model #: [id number]     Serial #: [id number]     Status: active Lead 2    Location: RV     DOI: 04/23/1992     Model #: [id number]     Serial #: [id number]     Status: active  Magnet Response Rate:  BOL 98.6 ERI  86.3  Indications:  Sick sinus syndrome  Explantation Comments:  TTM's with [employer]  PPM Follow Up Remote Check?  No Battery Voltage:  2.79 V     Battery Est. Longevity:  10 YEARS     Pacer Dependent:  No       PPM Device Measurements Atrium  Amplitude: 1.2 mV, Impedance: 332 ohms,  Right Ventricle  Amplitude: 11.2 mV, Impedance: 439 ohms, Threshold: 0.5 V at 0.4 msec  Episodes MS Episodes:  12     Coumadin:  Yes Ventricular High Rate:  NOT AVAILABLE     Atrial Pacing:  5.1%     Ventricular Pacing:  68%  Parameters Mode:  DDIR     Lower Rate Limit:  60     Upper Rate Limit:  115 Paced AV Delay:  170     Rate Response Parameters:  Threshold-Auto (+0), Slope-8, Reaction time-fast, Recovery time-medium Next Cardiology Appt Due:  01/06/2010 Tech Comments:  Normal device function.  No changes made today. Pt with known afib, programmed DDIR, on Coumadin.  Pt does TTM's with [employer].  ROV 6 months GT. Gypsy Balsam RN BSN  July 09, 2009 11:28 AM  MD Comments:  Agree with above.

## 2010-07-08 NOTE — Cardiovascular Report (Signed)
Summary: Office Visit   Office Visit   Imported By: Roderic Ovens 03/05/2009 12:03:25  _____________________________________________________________________  External Attachment:    Type:   Image     Comment:   External Document

## 2010-07-08 NOTE — Medication Information (Signed)
Summary: rov/tm  Anticoagulant Therapy  Managed by: Elaina Pattee, PharmD Referring MD: B. Crenshaw PCP: Dondra Spry DO Supervising MD: Graciela Husbands MD, Viviann Spare Indication 1: Aortic Valve Replacement (ICD-V43.3) Indication 2: Atrial Fibrillation (ICD-427.31) Lab Used: [facility] Site: [facility] INR POC 2.8 INR RANGE 2.5 - 3.5  Dietary changes: no    Health status changes: no    Bleeding/hemorrhagic complications: no    Recent/future hospitalizations: no    Any changes in medication regimen? no    Recent/future dental: no  Any missed doses?: no       Is patient compliant with meds? yes       Allergies: No Known Drug Allergies  Anticoagulation Management History:      The patient is taking warfarin and comes in today for a routine follow up visit.  Positive risk factors for bleeding include an age of 46 years or older.  The bleeding index is 'intermediate risk'.  Positive CHADS2 values include History of CHF, History of HTN, and Age > 82 years old.  The start date was 10/08/1997.  Anticoagulation responsible provider: Graciela Husbands MD, Viviann Spare.  INR POC: 2.8.  Cuvette Lot#: [id number].  Exp: 10/2010.    Anticoagulation Management Assessment/Plan:      The patient's current anticoagulation dose is Coumadin 5 mg  tabs: use asd.  The target INR is 2.5-3.5.  The next INR is due 10/11/2009.  Anticoagulation instructions were given to patient.  Results were reviewed/authorized by Elaina Pattee, PharmD.  He was notified by Elaina Pattee, PharmD.         Prior Anticoagulation Instructions: INR 2.1 Today take extra 1/2 pill  then resume 1 pill everyday except 1/2pill on Sundays and Thursdays, Recheck in 2 weeks.   Current Anticoagulation Instructions: INR 2.8. Take 1 tablet daily except 1/2 tablet Sun and Thurs.

## 2010-07-08 NOTE — Cardiovascular Report (Signed)
Summary: TTM   TTM   Imported By: Roderic Ovens 10/31/2009 11:30:11  _____________________________________________________________________  External Attachment:    Type:   Image     Comment:   External Document

## 2010-07-08 NOTE — Medication Information (Signed)
Summary: rov/tm  Anticoagulant Therapy  Managed by: Bethena Midget, RN, BSN Referring MD: B. Crenshaw PCP: Dondra Spry DO Supervising MD: Riley Kill MD, Maisie Fus Indication 1: Aortic Valve Replacement (ICD-V43.3) Indication 2: Atrial Fibrillation (ICD-427.31) Lab Used: [facility] Site: [facility] INR POC 2.8 INR RANGE 2.5 - 3.5  Dietary changes: no    Health status changes: no    Bleeding/hemorrhagic complications: no    Recent/future hospitalizations: no    Any changes in medication regimen? no    Recent/future dental: no  Any missed doses?: no       Is patient compliant with meds? yes       Allergies (verified): No Known Drug Allergies  Anticoagulation Management History:      The patient is taking warfarin and comes in today for a routine follow up visit.  Positive risk factors for bleeding include an age of 75 years or older.  The bleeding index is 'intermediate risk'.  Positive CHADS2 values include History of CHF, History of HTN, and Age > 45 years old.  The start date was 10/08/1997.  Anticoagulation responsible provider: Riley Kill MD, Maisie Fus.  INR POC: 2.8.  Cuvette Lot#: [id number].  Exp: 02/2011.    Anticoagulation Management Assessment/Plan:      The patient's current anticoagulation dose is Coumadin 5 mg  tabs: use asd.  The target INR is 2.5-3.5.  The next INR is due 01/08/2010.  Anticoagulation instructions were given to patient.  Results were reviewed/authorized by Bethena Midget, RN, BSN.  He was notified by Bethena Midget, RN, BSN.         Prior Anticoagulation Instructions: INR 3.8 Skip tomorrow's dose then resume 1 pill everyday except 1/2 pill on Sundays and Thursdays. Recheck in 2 weeks.   Current Anticoagulation Instructions: INR 2.8 Continue 1 pill everyday except 1/2 pill on Sundays and Thursdays. Recheck in 3 weeks.

## 2010-07-08 NOTE — Medication Information (Signed)
Summary: rov/kb  Anticoagulant Therapy  Managed by: Eda Keys, PharmD Referring MD: B. Crenshaw PCP: Dondra Spry DO Supervising MD: Johney Frame MD, Fayrene Fearing Indication 1: Aortic Valve Replacement (ICD-V43.3) Indication 2: Atrial Fibrillation (ICD-427.31) Lab Used: [facility] Site: [address] INR POC 4.0 INR RANGE 2.5 - 3.5  Dietary changes: no    Health status changes: no    Bleeding/hemorrhagic complications: no    Recent/future hospitalizations: no    Any changes in medication regimen? no    Recent/future dental: no  Any missed doses?: no       Is patient compliant with meds? yes       Current Medications (verified): 1)  Coumadin 5 Mg  Tabs (Warfarin Sodium) .... Use Asd 2)  Lanoxin 0.125 Mg  Tabs (Digoxin) .... Once Daily 3)  Simvastatin 40 Mg  Tabs (Simvastatin) .... Once Daily 4)  Lorazepam 1 Mg  Tabs (Lorazepam) .... Take 1/2  Tablet By Mouth Once A Day As Needed 5)  Carvedilol 25 Mg Tabs (Carvedilol) .... One By Mouth Two Times A Day 6)  Eye Vite .... Once Daily 7)  Diovan 160 Mg Tabs (Valsartan) .... One By Mouth Qd 8)  Finasteride 5 Mg Tabs (Finasteride) .... One By Mouth Once Daily 9)  Amlodipine Besylate 5 Mg Tabs (Amlodipine Besylate) .... Take 1 Tablet Daily.  Allergies (verified): No Known Drug Allergies  Anticoagulation Management History:      The patient is taking warfarin and comes in today for a routine follow up visit.  Positive risk factors for bleeding include an age of 17 years or older.  The bleeding index is 'intermediate risk'.  Positive CHADS2 values include History of CHF, History of HTN, and Age > 78 years old.  The start date was 10/08/1997.  Anticoagulation responsible provider: Nicie Milan MD, Fayrene Fearing.  INR POC: 4.0.  Cuvette Lot#: [id number].  Exp: 01/2011.    Anticoagulation Management Assessment/Plan:      The patient's current anticoagulation dose is Coumadin 5 mg  tabs: use asd.  The target INR is 2.5-3.5.  The next INR is due 11/13/2009.   Anticoagulation instructions were given to patient.  Results were reviewed/authorized by Eda Keys, PharmD.  He was notified by Eda Keys.         Prior Anticoagulation Instructions: INR-3.8 Hold coumadin tomorrow.Return to regular schedule after tomorrow. Take 1 tablet daily except 1/2 a tablet on Sunday and Thursday  Current Anticoagulation Instructions: INR 4.0  Do NOT take coumadin tomorrow, then take only 1/2 tablet on Friday.  Then return to taking 1/2 tablet on Sunday adn Thursday and 1 tablet all other days.  Return to clinic  in 2 weeks.

## 2010-07-08 NOTE — Progress Notes (Signed)
Summary: Medication Change  Phone Note Call from Patient Call back at Home Phone [phone] Call back at [phone]   Caller: Daughter-Jackie Call For: D. Thomos Lemons DO Summary of Call: patients daughter Annice Pih called and left voice message stating when patient was seen in the office , he was advised to cut his blood pressure in half. Annice Pih is calling to verify which medication it was that Dr Artist Pais would like for them to cut in half. Initial call taken by: Glendell Docker CMA,  January 01, 2010 4:49 PM  Follow-up for Phone Call        pt was advised to cut his cholesterol medication in half.  half of simvastatin 40 mg. once finished - he can pick up rx for 20 mg of simvastatin Follow-up by: D. Thomos Lemons DO,  January 01, 2010 7:53 PM  Additional Follow-up for Phone Call Additional follow up Details #1::        Pt advised. Left detailed message at [facility] number re: Dr. Olegario Messier instructions and to call if she has any questions.  Nicki Guadalajara Fergerson CMA Duncan Dull)  January 02, 2010 11:36 AM

## 2010-07-08 NOTE — Miscellaneous (Signed)
Summary: corrected pharmacy info.  Clinical Lists Changes

## 2010-07-08 NOTE — Assessment & Plan Note (Signed)
Summary: pc2   Visit Type:  Follow-up Primary Provider:  Dondra Spry DO   History of Present Illness: [name] is a pleasant elderly man who has a history of coronary disease status post coronary bypassing graft, history of aortic valve replacement with St. Jude valve, permanent atrial fibrillation, abdominal aortic aneurysm, and hypertension. He is s/p PPM and returns today for followup.  He denies c/p, sob, or peripheral edema.  He has been recently treated for bronchitis.   Current Medications (verified): 1)  Coumadin 5 Mg  Tabs (Warfarin Sodium) .... Use Asd 2)  Lanoxin 0.125 Mg  Tabs (Digoxin) .... Once Daily 3)  Simvastatin 40 Mg  Tabs (Simvastatin) .... Once Daily 4)  Lorazepam 1 Mg  Tabs (Lorazepam) .... Take 1/2  Tablet By Mouth Once A Day As Needed 5)  Carvedilol 25 Mg Tabs (Carvedilol) .... One By Mouth Two Times A Day 6)  Eye Vite .... Once Daily 7)  Diovan 160 Mg Tabs (Valsartan) .... One By Mouth Qd 8)  Finasteride 5 Mg Tabs (Finasteride) .... One By Mouth Once Daily  Allergies (verified): No Known Drug Allergies  Past History:  Past Medical History: Last updated: 06/14/2009 COPD Coronary artery disease  Hyperlipidemia  Hypertension  Osteoarthritis  Benign prostatic hypertrophy  Low back pain/lumbar disc disease Congestive heart failure Atrial fibrillation - permanent Colonic polyps, hx of Diverticulosis, colon  Anxiety  GERD    Peripheral vascular disease  Transient ischemic attack, hx of C. Diff colitis  Recurrent diarrhea   Past Surgical History: Last updated: 06/14/2009 Coronary artery bypass graft Permanent pacemaker AAA repair with intraluminal graft  s/p aortic valve replacement          Review of Systems  The patient denies chest pain, syncope, dyspnea on exertion, and peripheral edema.    Vital Signs:  Patient profile:   77 year old male Height:      67 inches Weight:      180 pounds BMI:     28.29 Pulse rate:   63 /  minute BP sitting:   120 / 60  (left arm)  Vitals Entered By: Laurance Flatten CMA (September 10, 2009 3:09 PM)  Physical Exam  General:  alert, well-developed, and well-nourished.   Head:  HEENT is normal. Eyes:  vision grossly intact, pupils equal, pupils round, and pupils reactive to light.   Mouth:  dentures Neck:  supple with no bruits. No thyromegaly. Chest Wall:  Well healed PPM incision. Lungs:  normal respiratory effort and normal breath sounds.   Heart:  normal rate, regular rhythm, and no gallop.   Abdomen:  soft, non-tender, and no masses.   Extremities:  No lower extremity edema  Neurologic:  cranial nerves II-XII intact and gait normal.     PPM Specifications Following MD:  Joshua Bunting, MD     Referring MD:  [facility] Vendor:  St Jude     PPM Model Number:  [id number]     PPM Serial Number:  [id number] DOI:  01/30/2005      Lead 1    Location: [facility]     DOI: 04/23/1992     Model #: 1216T     Serial #: [id number]     Status: active Lead 2    Location: RV     DOI: 04/23/1992     Model #: [id number]     Serial #: [id number]     Status: active  Magnet Response Rate:  BOL 98.6 ERI  86.3  Indications:  Sick sinus syndrome  Explantation Comments:  TTM's with [employer]  PPM Follow Up Remote Check?  No Battery Voltage:  2.78 V     Battery Est. Longevity:  6 years     Pacer Dependent:  No       PPM Device Measurements Atrium  Amplitude: 0.9 mV, Impedance: 336 ohms,  Right Ventricle  Amplitude: 9.5 mV, Impedance: 453 ohms, Threshold: 0.5 V at 0.4 msec  Episodes Percent Mode Switch:  100%     Coumadin:  Yes Atrial Pacing:  3.6%     Ventricular Pacing:  65%  Parameters Mode:  DDIR     Lower Rate Limit:  60     Upper Rate Limit:  115 Paced AV Delay:  170     Rate Response Parameters:  Threshold-Auto (+0), Slope-8, Reaction time-fast, Recovery time-medium Next Cardiology Appt Due:  03/08/2010 Tech Comments:  No parameter changes.  Device function normal.  TTM's with [employer].  ROV 6 months  clinic. Checked by [employer]. Altha Harm, LPN  September 10, 1608 3:34 PM  MD Comments:  Agree with above.  Impression & Recommendations:  Problem # 1:  PACEMAKER, PERMANENT (ICD-V45.01) His PPM is working normally.  Will continue followup in one year.  Problem # 2:  CORONARY ATHEROSCLEROSIS NATIVE CORONARY ARTERY (ICD-414.01) He denies anginal symptoms.  Continue current meds. His updated medication list for this problem includes:    Coumadin 5 Mg Tabs (Warfarin sodium) ..... Use asd    Carvedilol 25 Mg Tabs (Carvedilol) ..... One by mouth two times a day  Problem # 3:  ATRIAL FIBRILLATION (ICD-427.31) continue current meds.  His rate appears to be well controlled. His updated medication list for this problem includes:    Coumadin 5 Mg Tabs (Warfarin sodium) ..... Use asd    Lanoxin 0.125 Mg Tabs (Digoxin) ..... Once daily    Carvedilol 25 Mg Tabs (Carvedilol) ..... One by mouth two times a day  Patient Instructions: 1)  Your physician recommends that you schedule a follow-up appointment in: 6 months with Dr Ladona Ridgel

## 2010-07-08 NOTE — Medication Information (Signed)
Summary: rov/cb  Anticoagulant Therapy  Managed by: Eda Keys, PharmD Referring MD: B. Crenshaw PCP: Dondra Spry DO Supervising MD: Jens Som MD, Arlys John Indication 1: Aortic Valve Replacement (ICD-V43.3) Indication 2: Atrial Fibrillation (ICD-427.31) Lab Used: [facility] Site: [facility] INR POC 3.8 INR RANGE 2.5 - 3.5  Dietary changes: no    Health status changes: no    Bleeding/hemorrhagic complications: no    Recent/future hospitalizations: no    Any changes in medication regimen? no    Recent/future dental: no  Any missed doses?: no       Is patient compliant with meds? yes       Allergies: No Known Drug Allergies  Anticoagulation Management History:      The patient is taking warfarin and comes in today for a routine follow up visit.  Positive risk factors for bleeding include an age of 75 years or older.  The bleeding index is 'intermediate risk'.  Positive CHADS2 values include History of CHF, History of HTN, and Age > 29 years old.  The start date was 10/08/1997.  Anticoagulation responsible provider: Jens Som MD, Arlys John.  INR POC: 3.8.  Cuvette Lot#: [id number].  Exp: 11/2010.    Anticoagulation Management Assessment/Plan:      The patient's current anticoagulation dose is Coumadin 5 mg  tabs: use asd.  The target INR is 2.5-3.5.  The next INR is due 10/30/2009.  Anticoagulation instructions were given to patient.  Results were reviewed/authorized by Eda Keys, PharmD.  He was notified by [facility].         Prior Anticoagulation Instructions: INR 2.8. Take 1 tablet daily except 1/2 tablet Sun and Thurs.  Current Anticoagulation Instructions: INR-3.8 Hold coumadin tomorrow.Return to regular schedule after tomorrow. Take 1 tablet daily except 1/2 a tablet on Sunday and Thursday

## 2010-07-08 NOTE — Assessment & Plan Note (Signed)
Summary: per check out/sf   Visit Type:  ov Primary Provider:  Dondra Spry DO  CC:  dizziness.  History of Present Illness: Mr. Crom is a pleasant gentleman who has a history of coronary disease status post coronary bypassing graft, history of aortic valve replacement with St. Jude valve, permanent atrial fibrillation, abdominal aortic aneurysm, and hypertension. Last echocardiogram in December 2010 showed normal LV function, mild left atrial enlargement, moderate right atrial enlargement and a prosthetic aortic valve.  Last Myoview was performed in May of 2010. There was a prior lateral infarct and it was interpreted as moderate peri-infarct ischemia. I reviewed this and felt that it was mild and we have treated medically. The ejection fraction was 57%. I last saw him in Nov of 2010. Since then he has dyspnea with more extreme activities but not with routine activities. It is relieved with rest. There is no associated chest pain. There is no orthopnea, PND or pedal edema. There is no exertional chest pain or syncope. There's been no bleeding. He is unsteady on his feet.   Current Medications (verified): 1)  Coumadin 5 Mg  Tabs (Warfarin Sodium) .... Use Asd 2)  Lanoxin 0.125 Mg  Tabs (Digoxin) .... Once Daily  (Pt Has 0.25 On List Not Sure of Dose) 3)  Simvastatin 20 Mg Tabs (Simvastatin) .... One By Mouth Once Daily 4)  Lorazepam 1 Mg  Tabs (Lorazepam) .... Take 1/2  Tablet By Mouth Once A Day As Needed 5)  Carvedilol 25 Mg Tabs (Carvedilol) .... One By Mouth Two Times A Day 6)  Eye Vite .... Once Daily 7)  Diovan 160 Mg Tabs (Valsartan) .... One By Mouth Qd 8)  Finasteride 5 Mg Tabs (Finasteride) .... One By Mouth Once Daily 9)  Amlodipine Besylate 5 Mg Tabs (Amlodipine Besylate) .... Take 1 Tablet Daily.  Allergies (verified): No Known Drug Allergies  Past History:  Past Medical History: Reviewed history from 12/30/2009 and no changes required. COPD Coronary artery disease     Hyperlipidemia   Hypertension  Osteoarthritis  Benign prostatic hypertrophy  Low back pain/lumbar disc disease Congestive heart failure Atrial fibrillation - permanent Colonic polyps, hx of Diverticulosis, colon  Anxiety  GERD    Peripheral vascular disease  Transient ischemic attack, hx of C. Diff colitis  Recurrent diarrhea   Past Surgical History: Reviewed history from 12/30/2009 and no changes required. Coronary artery bypass graft Permanent pacemaker AAA repair with intraluminal graft  s/p aortic valve replacement            Social History: Reviewed history from 12/30/2009 and no changes required. Former Smoker   Alcohol use-no Widowed            Review of Systems       Problems with unsteady gait but no fevers or chills, productive cough, hemoptysis, dysphasia, odynophagia, melena, hematochezia, dysuria, hematuria, rash, seizure activity, orthopnea, PND, pedal edema, claudication. Remaining systems are negative.   Vital Signs:  Patient profile:   77 year old male Height:      67 inches Weight:      185.31 pounds BP sitting:   122 / 74  (left arm) Cuff size:   regular  Vitals Entered By: Caralee Ates CMA (February 05, 2010 10:30 AM)  Physical Exam  General:  Well-developed well-nourished in no acute distress.  Skin is warm and dry.  HEENT is normal.  Neck is supple. No thyromegaly.  Chest is clear to auscultation with normal expansion.  Cardiovascular exam  is regular rate and rhythm.  Abdominal exam nontender or distended. No masses palpated. Extremities show no edema. neuro grossly intact    PPM Specifications Following MD:  Lewayne Bunting, MD     Referring MD:  [facility] Vendor:  St Jude     PPM Model Number:  [id number]     PPM Serial Number:  [id number] DOI:  01/30/2005      Lead 1    Location: [facility]     DOI: 04/23/1992     Model #: [id number]     Serial #: [id number]     Status: active Lead 2    Location: [facility]     DOI: 04/23/1992     Model #: [id number]     Serial  #: [id number]     Status: active  Magnet Response Rate:  BOL 98.6 ERI  86.3  Indications:  Sick sinus syndrome  Explantation Comments:  TTM's with [employer]  PPM Follow Up Pacer Dependent:  No      Episodes Coumadin:  Yes  Parameters Mode:  DDIR     Lower Rate Limit:  60     Upper Rate Limit:  115 Paced AV Delay:  170     Rate Response Parameters:  Threshold-Auto (+0), Slope-8, Reaction time-fast, Recovery time-medium  Impression & Recommendations:  Problem # 1:  CORONARY ATHEROSCLEROSIS NATIVE CORONARY ARTERY (ICD-414.01) Continue Coumadin, beta blocker and statin. Not on aspirin as he requires Coumadin for his aortic valve and is unsteady on his feet. His updated medication list for this problem includes:    Coumadin 5 Mg Tabs (Warfarin sodium) ..... Use asd    Carvedilol 25 Mg Tabs (Carvedilol) ..... One by mouth two times a day    Amlodipine Besylate 5 Mg Tabs (Amlodipine besylate) .Marland Kitchen... Take 1 tablet daily.  Problem # 2:  PURE HYPERCHOLESTEROLEMIA (ICD-272.0) Continue statin. Lipids and liver monitored by primary care. His updated medication list for this problem includes:    Simvastatin 20 Mg Tabs (Simvastatin) ..... One by mouth once daily  Problem # 3:  AORTIC VALVE REPLACEMENT, HX OF (ICD-V43.3) Continued SBE prophylaxis.  Problem # 4:  PACEMAKER, PERMANENT (ICD-V45.01) Management per electrophysiology.  Problem # 5:  AAA (ICD-441.4) Followed by vascular surgery.  Problem # 6:  COUMADIN THERAPY (ICD-V58.61) Monitored in the Coumadin clinic. CBC monitored by primary care.  Problem # 7:  ATRIAL FIBRILLATION (ICD-427.31) Continue beta blocker, digoxin and Coumadin. His updated medication list for this problem includes:    Coumadin 5 Mg Tabs (Warfarin sodium) ..... Use asd    Lanoxin 0.125 Mg Tabs (Digoxin) ..... Once daily  (pt has 0.25 on list not sure of dose)    Carvedilol 25 Mg Tabs (Carvedilol) ..... One by mouth two times a day  Problem # 8:  HYPERTENSION  (ICD-401.9) Blood pressure controlled on present medications. Will continue. Potassium and renal function monitored by primary care. His updated medication list for this problem includes:    Carvedilol 25 Mg Tabs (Carvedilol) ..... One by mouth two times a day    Diovan 160 Mg Tabs (Valsartan) ..... One by mouth qd    Amlodipine Besylate 5 Mg Tabs (Amlodipine besylate) .Marland Kitchen... Take 1 tablet daily.  Patient Instructions: 1)  Your physician recommends that you schedule a follow-up appointment in: 6 MONTHS

## 2010-07-10 NOTE — Cardiovascular Report (Signed)
Summary: TTM   TTM   Imported By: Roderic Ovens 06/06/2010 11:42:12  _____________________________________________________________________  External Attachment:    Type:   Image     Comment:   External Document

## 2010-07-10 NOTE — Assessment & Plan Note (Signed)
Summary: cough congestion/mhf--Rm 5   Vital Signs:  Patient profile:   77 year old male Height:      67 inches Weight:      185.75 pounds BMI:     29.20 O2 Sat:      97 % on Room air Temp:     98.1 degrees F oral Pulse rate:   66 / minute Pulse rhythm:   irregular Resp:     18 per minute BP sitting:   120 / 70  (right arm) Cuff size:   large  Vitals Entered By: Mervin Kung CMA Duncan Dull) (May 21, 2010 9:40 AM)  O2 Flow:  Room air Is Patient Diabetic? No Pain Assessment Patient in pain? no      Comments Pt states he takes Welchal? as needed to keep the bacteria balanced in his intestines. Nicki Guadalajara Fergerson CMA Duncan Dull)  May 21, 2010 9:58 AM    Primary Care Provider:  Dondra Spry DO   History of Present Illness: [name] is an 77 year old male who presents with 2 week history of malaise, fatigue and weakness and cough. Symptoms are associated with nasal congestion x 2-3 days, and a dry hacking cough.  Symptoms are not associated with abdominal pain, nausea, or vomitting.  Pt reports + chronic SOB, but this is worse than his baseline.   Skin rash- Daughter also notes that he has been complaining of itching between his buttocks. She also reports recent worsening of his weakness x 4-5 days.    Reflux- feels like "food comes back up on me, especially at night."      Allergies (verified): No Known Drug Allergies  Past History:  Past Medical History: Last updated: 12/30/2009 COPD Coronary artery disease   Hyperlipidemia   Hypertension  Osteoarthritis  Benign prostatic hypertrophy  Low back pain/lumbar disc disease Congestive heart failure Atrial fibrillation - permanent Colonic polyps, hx of Diverticulosis, colon  Anxiety  GERD    Peripheral vascular disease  Transient ischemic attack, hx of C. Diff colitis  Recurrent diarrhea   Past Surgical History: Coronary artery bypass graft Permanent pacemaker AAA repair with intraluminal graft  s/p  aortic valve replacement           Detached Retina Repair, right eye-- 02/2010, Dr Luciana Axe  Review of Systems       see HPI, + insomnia- not sleeping well since lorazepam was cut in half.  Notes chronic nocturia-  this makes it harder for him to sleep.    Physical Exam  General:  Well-developed,well-nourished,in no acute distress; alert,appropriate and cooperative throughout examination Head:  Normocephalic and atraumatic without obvious abnormalities. No apparent alopecia or balding. Lungs:  diminished breath sounds at the bases, no wheezes, crackles or increased work of breathing.  Heart:  Normal rate and regular rhythm. S1 and S2 normal- except mechanical valve sound. Rectal:  refused Msk:  generalized weakness noted Extremities:  + clubbing, trace peripheral edema is noted.  Neurologic:  alert & oriented X3.   Psych:  Oriented X3, memory intact for recent and remote, not anxious appearing, and not depressed appearing.     Impression & Recommendations:  Problem # 1:  BRONCHITIS (ICD-490) Assessment New UA is unremarkable. Chest x-ray is negative for infiltrate or edema. Due to progressive weakness and cough, will plan empiric treatment with ceftin.   Orders: CXR- 2view (CXR) Prescription Created Electronically [id number])  His updated medication list for this problem includes:    Ceftin 500 Mg Tabs (Cefuroxime axetil) .Marland KitchenMarland KitchenMarland KitchenMarland Kitchen  One tablet by mouth two times a day for 7 days  Problem # 2:  GERD (ICD-530.81) Assessment: Deteriorated  Will give him a trial of OTC omeprazole.  Reflux precautions were reviewed with patient and daughter. His updated medication list for this problem includes:    Omeprazole 20 Mg Cpdr (Omeprazole) ..... One tablet by mouth once daily for reflux.  Complete Medication List: 1)  Coumadin 5 Mg Tabs (Warfarin sodium) .... Use asd 2)  Lanoxin 0.125 Mg Tabs (Digoxin) .... Once daily  (pt has 0.25 on list not sure of dose) 3)  Simvastatin 20 Mg Tabs (Simvastatin)  .... One by mouth once daily 4)  Lorazepam 1 Mg Tabs (Lorazepam) .... Take 1/2  tablet by mouth once a day as needed 5)  Carvedilol 25 Mg Tabs (Carvedilol) .... One by mouth two times a day 6)  Eye Vite  .... Once daily 7)  Diovan 160 Mg Tabs (Valsartan) .... One by mouth qd 8)  Finasteride 5 Mg Tabs (Finasteride) .... One by mouth once daily 9)  Amlodipine Besylate 5 Mg Tabs (Amlodipine besylate) .... Take 1 tablet daily. 10)  Warfarin Sodium 5 Mg Tabs (Warfarin sodium) .... Use as directed by anticoagulation clinic 11)  Otc Mucus Relief  .... Take 1 tablet daily. 12)  Omeprazole 20 Mg Cpdr (Omeprazole) .... One tablet by mouth once daily for reflux. 13)  Nystatin 100000 Unit/gm Oint (Nystatin) .... Apply twice daily to buttocks/rash until healed 14)  Ceftin 500 Mg Tabs (Cefuroxime axetil) .... One tablet by mouth two times a day for 7 days  Other Orders: UA Dipstick w/o Micro (manual) (16109)  Patient Instructions: 1)  Call if fever, worsening weakness or worsening cough. 2)  Follow up with Dr. Artist Pais in 1 week.   Prescriptions: CEFTIN 500 MG TABS (CEFUROXIME AXETIL) one tablet by mouth two times a day for 7 days  #14 x 0   Entered and Authorized by:   Lemont Fillers FNP   Signed by:   Lemont Fillers FNP on 05/21/2010   Method used:   Electronically to        [facility] (retail)       [address]       [place], [place]  [address]       Ph: [phone]       Fax: [phone]   RxID:   [id number] NYSTATIN 100000 UNIT/GM OINT (NYSTATIN) apply twice daily to buttocks/rash until healed  #1 x 1   Entered and Authorized by:   Lemont Fillers FNP   Signed by:   Lemont Fillers FNP on 05/21/2010   Method used:   Electronically to        [facility] (retail)       [address]       [place], [place]  [address]       Ph: [phone]       Fax: [phone]   RxID:   [id number]    Orders Added: 1)  CXR- 2view [CXR] 2)  UA Dipstick w/o Micro (manual)  [81002] 3)  Est. Patient Level IV [60630] 4)  Prescription Created Electronically [phone]    Current Allergies (reviewed today): No known allergies     Vital Signs:  Patient Profile:   77 year old male Height:     67 inches Weight:      185.75 pounds BMI:     29.20 O2 Sat:      97 % Temp:  98.1 degrees F oral Pulse rate:   66 / minute Pulse rhythm:   irregular Resp:     18 per minute BP sitting:   120 / 70 Cuff size:   large                 Laboratory Results   Urine Tests    Routine Urinalysis   Color: straw Appearance: Clear Glucose: negative   (Normal Range: Negative) Bilirubin: negative   (Normal Range: Negative) Ketone: negative   (Normal Range: Negative) Spec. Gravity: >=1.030   (Normal Range: 1.003-1.035) Blood: negative   (Normal Range: Negative) pH: 5.0   (Normal Range: 5.0-8.0) Protein: negative   (Normal Range: Negative) Urobilinogen: 0.2   (Normal Range: 0-1) Nitrite: negative   (Normal Range: Negative) Leukocyte Esterace: negative   (Normal Range: Negative)

## 2010-07-10 NOTE — Cardiovascular Report (Signed)
Summary: TTM   TTM   Imported By: Roderic Ovens 05/22/2010 14:22:06  _____________________________________________________________________  External Attachment:    Type:   Image     Comment:   External Document

## 2010-07-10 NOTE — Medication Information (Signed)
Summary: rov/ewj  Anticoagulant Therapy  Managed by: Weston Brass, PharmD Referring MD: B. Crenshaw PCP: Dondra Spry DO Supervising MD: Graciela Husbands MD, Viviann Spare Indication 1: Aortic Valve Replacement (ICD-V43.3) Indication 2: Atrial Fibrillation (ICD-427.31) Lab Used: [facility] Site: [facility] INR POC 3.7 INR RANGE 2.5 - 3.5  Dietary changes: no    Health status changes: no    Bleeding/hemorrhagic complications: no    Recent/future hospitalizations: no    Any changes in medication regimen? no    Recent/future dental: no  Any missed doses?: no       Is patient compliant with meds? yes       Allergies: No Known Drug Allergies  Anticoagulation Management History:      The patient is taking warfarin and comes in today for a routine follow up visit.  Positive risk factors for bleeding include an age of 64 years or older.  The bleeding index is 'intermediate risk'.  Positive CHADS2 values include History of CHF, History of HTN, and Age > 43 years old.  The start date was 10/08/1997.  Anticoagulation responsible provider: Graciela Husbands MD, Viviann Spare.  INR POC: 3.7.  Cuvette Lot#: [id number].  Exp: 06/2011.    Anticoagulation Management Assessment/Plan:      The patient's current anticoagulation dose is Coumadin 5 mg  tabs: use asd, Warfarin sodium 5 mg tabs: Use as directed by Anticoagulation Clinic.  The target INR is 2.5-3.5.  The next INR is due 07/17/2010.  Anticoagulation instructions were given to patient.  Results were reviewed/authorized by Weston Brass, PharmD.  He was notified by Stephannie Peters, PharmD Candidate .         Prior Anticoagulation Instructions: INR 4.8  Skip tomorrow's dosage of Coumadin, then start taking 1/2 tablet daily except 1 tablet on Mondays and Fridays.  Recheck in 10-14 days.    Current Anticoagulation Instructions: INR 3.7  Coumadin 5 mg tablets - Skip dose on 1/25, then resume 1/2 tablet every day except 1 tablet on Mondays and Fridays

## 2010-07-10 NOTE — Medication Information (Signed)
Summary: rov/mw  Anticoagulant Therapy  Managed by: Weston Brass, PharmD Referring MD: B. Crenshaw PCP: Dondra Spry DO Supervising MD: Gala Romney MD, Reuel Boom Indication 1: Aortic Valve Replacement (ICD-V43.3) Indication 2: Atrial Fibrillation (ICD-427.31) Lab Used: [facility] Site: [facility] INR POC 3.4 INR RANGE 2.5 - 3.5  Dietary changes: no    Health status changes: no    Bleeding/hemorrhagic complications: no    Recent/future hospitalizations: no    Any changes in medication regimen? no    Recent/future dental: no  Any missed doses?: no       Is patient compliant with meds? yes       Allergies: No Known Drug Allergies  Anticoagulation Management History:      The patient is taking warfarin and comes in today for a routine follow up visit.  Positive risk factors for bleeding include an age of 75 years or older.  The bleeding index is 'intermediate risk'.  Positive CHADS2 values include History of CHF, History of HTN, and Age > 75 years old.  The start date was 10/08/1997.  Anticoagulation responsible Lajuane Leatham: Bensimhon MD, Reuel Boom.  INR POC: 3.4.  Cuvette Lot#: [id number].  Exp: 03/2011.    Anticoagulation Management Assessment/Plan:      The patient's current anticoagulation dose is Coumadin 5 mg  tabs: use asd, Warfarin sodium 5 mg tabs: Use as directed by Anticoagulation Clinic.  The target INR is 2.5-3.5.  The next INR is due 06/17/2010.  Anticoagulation instructions were given to patient.  Results were reviewed/authorized by Weston Brass, PharmD.  He was notified by Weston Brass PharmD.         Prior Anticoagulation Instructions: INR 4.3 skip your dose tomorrow and saturday. Then resume normal dosing schedule of 1 tablet on monday, wednesday, and friday. And a half tablet all other days. Recheck in 2 weeks.  Current Anticoagulation Instructions: INR 3.4  Continue same dose of 1/2 tablet every day except 1 tablet on Monday, Wednesday and Friday.  Recheck INR in 4  weeks.

## 2010-07-10 NOTE — Medication Information (Signed)
Summary: rov/sp  Anticoagulant Therapy  Managed by: Cloyde Reams, RN, BSN Referring MD: B. Crenshaw PCP: Dondra Spry DO Supervising MD: Gala Romney MD, Reuel Boom Indication 1: Aortic Valve Replacement (ICD-V43.3) Indication 2: Atrial Fibrillation (ICD-427.31) Lab Used: [facility] Site: [facility] INR POC 4.8 INR RANGE 2.5 - 3.5  Dietary changes: no    Health status changes: no    Bleeding/hemorrhagic complications: no    Recent/future hospitalizations: no    Any changes in medication regimen? no    Recent/future dental: no  Any missed doses?: no       Is patient compliant with meds? yes       Allergies: No Known Drug Allergies  Anticoagulation Management History:      The patient is taking warfarin and comes in today for a routine follow up visit.  Positive risk factors for bleeding include an age of 75 years or older.  The bleeding index is 'intermediate risk'.  Positive CHADS2 values include History of CHF, History of HTN, and Age > 67 years old.  The start date was 10/08/1997.  Anticoagulation responsible provider: Myan Suit MD, Reuel Boom.  INR POC: 4.8.  Cuvette Lot#: [id number].  Exp: 07/2011.    Anticoagulation Management Assessment/Plan:      The patient's current anticoagulation dose is Coumadin 5 mg  tabs: use asd, Warfarin sodium 5 mg tabs: Use as directed by Anticoagulation Clinic.  The target INR is 2.5-3.5.  The next INR is due 07/01/2010.  Anticoagulation instructions were given to patient.  Results were reviewed/authorized by Cloyde Reams, RN, BSN.  He was notified by Cloyde Reams RN.         Prior Anticoagulation Instructions: INR 3.4  Continue same dose of 1/2 tablet every day except 1 tablet on Monday, Wednesday and Friday.  Recheck INR in 4 weeks.   Current Anticoagulation Instructions: INR 4.8  Skip tomorrow's dosage of Coumadin, then start taking 1/2 tablet daily except 1 tablet on Mondays and Fridays.  Recheck in 10-14 days.

## 2010-07-17 ENCOUNTER — Encounter (INDEPENDENT_AMBULATORY_CARE_PROVIDER_SITE_OTHER): Payer: Medicare Other

## 2010-07-17 ENCOUNTER — Encounter: Payer: Self-pay | Admitting: Internal Medicine

## 2010-07-17 DIAGNOSIS — I359 Nonrheumatic aortic valve disorder, unspecified: Secondary | ICD-10-CM

## 2010-07-17 DIAGNOSIS — I4891 Unspecified atrial fibrillation: Secondary | ICD-10-CM

## 2010-07-17 LAB — CONVERTED CEMR LAB: POC INR: 2.7

## 2010-07-21 ENCOUNTER — Other Ambulatory Visit: Payer: Self-pay | Admitting: Cardiology

## 2010-07-21 ENCOUNTER — Encounter: Payer: Self-pay | Admitting: Cardiology

## 2010-07-21 ENCOUNTER — Ambulatory Visit (INDEPENDENT_AMBULATORY_CARE_PROVIDER_SITE_OTHER): Payer: Medicare Other | Admitting: Cardiology

## 2010-07-21 DIAGNOSIS — G459 Transient cerebral ischemic attack, unspecified: Secondary | ICD-10-CM

## 2010-07-21 DIAGNOSIS — I4891 Unspecified atrial fibrillation: Secondary | ICD-10-CM

## 2010-07-21 DIAGNOSIS — I359 Nonrheumatic aortic valve disorder, unspecified: Secondary | ICD-10-CM

## 2010-07-24 NOTE — Medication Information (Signed)
Summary: Coumadin Clinic  Anticoagulant Therapy  Managed by: Windell Hummingbird, RN Referring MD: B. Crenshaw PCP: Dondra Spry DO Supervising MD: Johney Frame MD, Fayrene Fearing Indication 1: Aortic Valve Replacement (ICD-V43.3) Indication 2: Atrial Fibrillation (ICD-427.31) Lab Used: [facility] Site: [facility] INR POC 2.7 INR RANGE 2.5 - 3.5  Dietary changes: no    Health status changes: no    Bleeding/hemorrhagic complications: no    Recent/future hospitalizations: no    Any changes in medication regimen? no    Recent/future dental: no  Any missed doses?: no       Is patient compliant with meds? yes       Allergies: No Known Drug Allergies  Anticoagulation Management History:      The patient is taking warfarin and comes in today for a routine follow up visit.  Positive risk factors for bleeding include an age of 75 years or older.  The bleeding index is 'intermediate risk'.  Positive CHADS2 values include History of CHF, History of HTN, and Age > 65 years old.  The start date was 10/08/1997.  Anticoagulation responsible provider: Emyah Roznowski MD, Fayrene Fearing.  INR POC: 2.7.  Cuvette Lot#: [id number].  Exp: 06/2011.    Anticoagulation Management Assessment/Plan:      The patient's current anticoagulation dose is Coumadin 5 mg  tabs: use asd, Warfarin sodium 5 mg tabs: Use as directed by Anticoagulation Clinic.  The target INR is 2.5-3.5.  The next INR is due 07/31/2010.  Anticoagulation instructions were given to patient.  Results were reviewed/authorized by Windell Hummingbird, RN.  He was notified by Windell Hummingbird, RN.         Prior Anticoagulation Instructions: INR 3.7  Coumadin 5 mg tablets - Skip dose on 1/25, then resume 1/2 tablet every day except 1 tablet on Mondays and Fridays   Current Anticoagulation Instructions: INR 2.7 Continue taking 1/2 tablet everyday, except take 1 tablet on Mondays and Fridays. Recheck in 2 weeks.

## 2010-07-29 ENCOUNTER — Encounter: Payer: Self-pay | Admitting: Cardiovascular Disease

## 2010-07-29 ENCOUNTER — Encounter: Payer: Self-pay | Admitting: Cardiology

## 2010-07-29 ENCOUNTER — Ambulatory Visit (HOSPITAL_COMMUNITY): Payer: Medicare Other | Attending: Cardiovascular Disease

## 2010-07-29 ENCOUNTER — Encounter (INDEPENDENT_AMBULATORY_CARE_PROVIDER_SITE_OTHER): Payer: Medicare Other

## 2010-07-29 ENCOUNTER — Ambulatory Visit (INDEPENDENT_AMBULATORY_CARE_PROVIDER_SITE_OTHER)
Admission: RE | Admit: 2010-07-29 | Discharge: 2010-07-29 | Disposition: A | Discharge status: Home / Self Care | Payer: Medicare Other | Source: Ambulatory Visit | Attending: Cardiology | Admitting: Cardiology

## 2010-07-29 DIAGNOSIS — I359 Nonrheumatic aortic valve disorder, unspecified: Secondary | ICD-10-CM

## 2010-07-29 DIAGNOSIS — E785 Hyperlipidemia, unspecified: Secondary | ICD-10-CM | POA: Insufficient documentation

## 2010-07-29 DIAGNOSIS — I10 Essential (primary) hypertension: Secondary | ICD-10-CM | POA: Insufficient documentation

## 2010-07-29 DIAGNOSIS — I080 Rheumatic disorders of both mitral and aortic valves: Secondary | ICD-10-CM | POA: Insufficient documentation

## 2010-07-29 DIAGNOSIS — Z954 Presence of other heart-valve replacement: Secondary | ICD-10-CM

## 2010-07-29 DIAGNOSIS — I079 Rheumatic tricuspid valve disease, unspecified: Secondary | ICD-10-CM | POA: Insufficient documentation

## 2010-07-29 DIAGNOSIS — Z7901 Long term (current) use of anticoagulants: Secondary | ICD-10-CM

## 2010-07-29 DIAGNOSIS — G459 Transient cerebral ischemic attack, unspecified: Secondary | ICD-10-CM

## 2010-07-29 DIAGNOSIS — I2510 Atherosclerotic heart disease of native coronary artery without angina pectoris: Secondary | ICD-10-CM | POA: Insufficient documentation

## 2010-07-29 DIAGNOSIS — I4891 Unspecified atrial fibrillation: Secondary | ICD-10-CM

## 2010-07-29 DIAGNOSIS — J4489 Other specified chronic obstructive pulmonary disease: Secondary | ICD-10-CM | POA: Insufficient documentation

## 2010-07-29 DIAGNOSIS — I379 Nonrheumatic pulmonary valve disorder, unspecified: Secondary | ICD-10-CM | POA: Insufficient documentation

## 2010-07-29 DIAGNOSIS — I509 Heart failure, unspecified: Secondary | ICD-10-CM | POA: Insufficient documentation

## 2010-07-29 DIAGNOSIS — Z8679 Personal history of other diseases of the circulatory system: Secondary | ICD-10-CM

## 2010-07-29 DIAGNOSIS — J449 Chronic obstructive pulmonary disease, unspecified: Secondary | ICD-10-CM | POA: Insufficient documentation

## 2010-07-29 LAB — CONVERTED CEMR LAB: POC INR: 2.9

## 2010-07-30 ENCOUNTER — Other Ambulatory Visit: Payer: Medicare Other

## 2010-07-30 NOTE — Assessment & Plan Note (Signed)
Summary: rov/pc   Primary Provider:  Dondra Spry DO  CC:  pt complains of memeory loss.  History of Present Illness: [name] is a pleasant gentleman who has a history of coronary disease status post coronary bypassing graft, history of aortic valve replacement with St. Jude valve, permanent atrial fibrillation, abdominal aortic aneurysm, and hypertension. Last echocardiogram in December 2010 showed normal LV function, mild left atrial enlargement, moderate right atrial enlargement and a prosthetic aortic valve.  Last Myoview was performed in May of 2010. There was a prior lateral infarct and it was interpreted as moderate peri-infarct ischemia. I reviewed this and felt that it was mild and we have treated medically. The ejection fraction was 57%. I last saw him in August of 2011. Since then he denies dyspnea, chest pain, palpitations or syncope. He did state that approximately one week ago he had transient loss of memory. There is no weakness or loss of sensation in his extremities. There was no dysarthria or other neuro symptoms.  Current Medications (verified): 1)  Coumadin 5 Mg  Tabs (Warfarin Sodium) .... Use Asd 2)  Lanoxin 0.125 Mg  Tabs (Digoxin) .... Once Daily  (Pt Has 0.25 On List Not Sure of Dose) 3)  Simvastatin 20 Mg Tabs (Simvastatin) .... One By Mouth Once Daily 4)  Lorazepam 1 Mg  Tabs (Lorazepam) .... Take 1/2  Tablet By Mouth Once A Day As Needed 5)  Carvedilol 25 Mg Tabs (Carvedilol) .... One By Mouth Two Times A Day 6)  Eye Vite .... Once Daily 7)  Diovan 160 Mg Tabs (Valsartan) .... One By Mouth Qd 8)  Finasteride 5 Mg Tabs (Finasteride) .... One By Mouth Once Daily 9)  Amlodipine Besylate 5 Mg Tabs (Amlodipine Besylate) .... Take 1 Tablet Daily. 10)  Warfarin Sodium 5 Mg Tabs (Warfarin Sodium) .... Use As Directed By Anticoagulation Clinic 11)  Otc Mucus Relief .... Take 1 Tablet Daily. 12)  Omeprazole 20 Mg Cpdr (Omeprazole) .... One Tablet By Mouth Once Daily For  Reflux. 13)  Nystatin 100000 Unit/gm Oint (Nystatin) .... Apply Twice Daily To Buttocks/rash Until Healed  Allergies: No Known Drug Allergies  Past History:  Past Medical History: Reviewed history from 12/30/2009 and no changes required. COPD Coronary artery disease   Hyperlipidemia   Hypertension  Osteoarthritis  Benign prostatic hypertrophy  Low back pain/lumbar disc disease Congestive heart failure Atrial fibrillation - permanent Colonic polyps, hx of Diverticulosis, colon  Anxiety  GERD    Peripheral vascular disease  Transient ischemic attack, hx of C. Diff colitis  Recurrent diarrhea   Past Surgical History: Reviewed history from 05/21/2010 and no changes required. Coronary artery bypass graft Permanent pacemaker AAA repair with intraluminal graft  s/p aortic valve replacement           Detached Retina Repair, right eye-- 02/2010, Dr Luciana Axe  Social History: Reviewed history from 12/30/2009 and no changes required. Former Smoker   Alcohol use-no Widowed            Review of Systems       no fevers or chills, productive cough, hemoptysis, dysphasia, odynophagia, melena, hematochezia, dysuria, hematuria, rash, seizure activity, orthopnea, PND, pedal edema, claudication. Remaining systems are negative.   Vital Signs:  Patient profile:   77 year old male Height:      67 inches Weight:      185 pounds BMI:     29.08 Pulse rate:   68 / minute Resp:     18 per  minute BP sitting:   130 / 80  (left arm)  Vitals Entered By: Kem Parkinson (July 21, 2010 11:16 AM)  Physical Exam  General:  Well-developed well-nourished in no acute distress.  Skin is warm and dry.  HEENT is normal.  Neck is supple. No thyromegaly.  Chest is clear to auscultation with normal expansion.  Cardiovascular exam is regular rate and rhythm. Is mechanical valve sound. Abdominal exam nontender or distended. No masses palpated. Extremities show no edema. neuro grossly  intact    EKG  Procedure date:  07/21/2010  Findings:      Ventricular pacing.  PPM Specifications Following MD:  Lewayne Bunting, MD     Referring MD:  [facility] Vendor:  St Jude     PPM Model Number:  [id number]     PPM Serial Number:  [id number] PPM DOI:  01/30/2005      Lead 1    Location: RA     DOI: 04/23/1992     Model #: [id number]     Serial #: [id number]     Status: active Lead 2    Location: RV     DOI: 04/23/1992     Model #: [id number]     Serial #: [id number]     Status: active  Magnet Response Rate:  BOL 98.6 ERI  86.3  Indications:  Sick sinus syndrome  Explantation Comments:  TTM's with [employer]  PPM Follow Up Pacer Dependent:  No      Episodes Coumadin:  Yes  Parameters Mode:  DDIR     Lower Rate Limit:  60     Upper Rate Limit:  115 Paced AV Delay:  170     Rate Response Parameters:  Threshold-Auto (+0), Slope-8, Reaction time-fast, Recovery time-medium  Impression & Recommendations:  Problem # 1:  CORONARY ATHEROSCLEROSIS NATIVE CORONARY ARTERY (ICD-414.01) Continue beta blocker and statin. Not on aspirin as he is on Coumadin and is unsteady on his feet. His updated medication list for this problem includes:    Coumadin 5 Mg Tabs (Warfarin sodium) ..... Use asd    Carvedilol 25 Mg Tabs (Carvedilol) ..... One by mouth two times a day    Amlodipine Besylate 5 Mg Tabs (Amlodipine besylate) .Marland Kitchen... Take 1 tablet daily.    Warfarin Sodium 5 Mg Tabs (Warfarin sodium) ..... Use as directed by anticoagulation clinic  His updated medication list for this problem includes:    Coumadin 5 Mg Tabs (Warfarin sodium) ..... Use asd    Carvedilol 25 Mg Tabs (Carvedilol) ..... One by mouth two times a day    Amlodipine Besylate 5 Mg Tabs (Amlodipine besylate) .Marland Kitchen... Take 1 tablet daily.    Warfarin Sodium 5 Mg Tabs (Warfarin sodium) ..... Use as directed by anticoagulation clinic  Problem # 2:  PURE HYPERCHOLESTEROLEMIA (ICD-272.0)  Continue statin. Lipids and liver monitored by primary  care. His updated medication list for this problem includes:    Simvastatin 20 Mg Tabs (Simvastatin) ..... One by mouth once daily  His updated medication list for this problem includes:    Simvastatin 20 Mg Tabs (Simvastatin) ..... One by mouth once daily  Problem # 3:  CONFUSION (ICD-298.9) Patient had recent transient loss of memory. Symptoms somewhat nonspecific but daughter concerned he had a TIA. Schedule CT of his head given Coumadin use. Check echo and carotid Dopplers.  Problem # 4:  AORTIC VALVE REPLACEMENT, HX OF (ICD-V43.3) Continue Coumadin. SBE prophylaxis. Repeat echo.  Problem # 5:  PACEMAKER, PERMANENT (ICD-V45.01)  Management per electrophysiology.  Problem # 6:  AAA (ICD-441.4) Followed by vascular surgery.  Problem # 7:  COUMADIN THERAPY (ICD-V58.61)  Followed in the Coumadin clinic.  Orders: CT Scan  (CT Scan)  Problem # 8:  ATRIAL FIBRILLATION (ICD-427.31)  Continue present medications. His updated medication list for this problem includes:    Coumadin 5 Mg Tabs (Warfarin sodium) ..... Use asd    Lanoxin 0.125 Mg Tabs (Digoxin) ..... Once daily  (pt has 0.25 on list not sure of dose)    Carvedilol 25 Mg Tabs (Carvedilol) ..... One by mouth two times a day    Warfarin Sodium 5 Mg Tabs (Warfarin sodium) ..... Use as directed by anticoagulation clinic  His updated medication list for this problem includes:    Coumadin 5 Mg Tabs (Warfarin sodium) ..... Use asd    Lanoxin 0.125 Mg Tabs (Digoxin) ..... Once daily  (pt has 0.25 on list not sure of dose)    Carvedilol 25 Mg Tabs (Carvedilol) ..... One by mouth two times a day    Warfarin Sodium 5 Mg Tabs (Warfarin sodium) ..... Use as directed by anticoagulation clinic  Problem # 9:  HYPERTENSION (ICD-401.9)  Blood pressure controlled. Continue present medications. His updated medication list for this problem includes:    Carvedilol 25 Mg Tabs (Carvedilol) ..... One by mouth two times a day    Diovan 160 Mg  Tabs (Valsartan) ..... One by mouth qd    Amlodipine Besylate 5 Mg Tabs (Amlodipine besylate) .Marland Kitchen... Take 1 tablet daily.  His updated medication list for this problem includes:    Carvedilol 25 Mg Tabs (Carvedilol) ..... One by mouth two times a day    Diovan 160 Mg Tabs (Valsartan) ..... One by mouth qd    Amlodipine Besylate 5 Mg Tabs (Amlodipine besylate) .Marland Kitchen... Take 1 tablet daily.  Problem # 10:  COPD (ICD-496)  Other Orders: Carotid Duplex (Carotid Duplex) Echocardiogram (Echo)  Patient Instructions: 1)  Your physician wants you to follow-up in: 6 MONTHS  You will receive a reminder letter in the mail two months in advance. If you don't receive a letter, please call our office to schedule the follow-up appointment. 2)  Non-Cardiac CT scanning, (CAT scanning), is a noninvasive, special x-ray that produces cross-sectional images of the body using x-rays and a computer. CT scans help physicians diagnose and treat medical conditions. For some CT exams, a contrast material is used to enhance visibility in the area of the body being studied. CT scans provide greater clarity and reveal more details than regular x-ray exams.NON-CONTRAST CT OF THE HEAD FOR QUESTION OF TIA AND COUMADIN TX 3)  Your physician has requested that you have a carotid duplex. This test is an ultrasound of the carotid arteries in your neck. It looks at blood flow through these arteries that supply the brain with blood. Allow one hour for this exam. There are no restrictions or special instructions. 4)  Your physician has requested that you have an echocardiogram.  Echocardiography is a painless test that uses sound waves to create images of your heart. It provides your doctor with information about the size and shape of your heart and how well your heart's chambers and valves are working.  This procedure takes approximately one hour. There are no restrictions for this procedure.

## 2010-07-31 ENCOUNTER — Ambulatory Visit: Payer: Self-pay | Admitting: Cardiology

## 2010-08-05 NOTE — Medication Information (Signed)
Summary: rov/pc  Anticoagulant Therapy  Managed by: Bethena Midget, RN, BSN Referring MD: B. Crenshaw PCP: Dondra Spry DO Supervising MD: Excell Seltzer MD, Casimiro Needle Indication 1: Aortic Valve Replacement (ICD-V43.3) Indication 2: Atrial Fibrillation (ICD-427.31) Lab Used: [facility] Site: [facility] INR POC 2.9 INR RANGE 2.5 - 3.5  Dietary changes: no    Health status changes: no    Bleeding/hemorrhagic complications: no    Recent/future hospitalizations: no    Any changes in medication regimen? no    Recent/future dental: no  Any missed doses?: no       Is patient compliant with meds? yes       Allergies: No Known Drug Allergies  Anticoagulation Management History:      The patient is taking warfarin and comes in today for a routine follow up visit.  Positive risk factors for bleeding include an age of 75 years or older and history of CVA/TIA.  The bleeding index is 'intermediate risk'.  Positive CHADS2 values include History of CHF, History of HTN, Age > 67 years old, and Prior Stroke/CVA/TIA.  The start date was 10/08/1997.  Anticoagulation responsible provider: Excell Seltzer MD, Casimiro Needle.  INR POC: 2.9.  Cuvette Lot#: [id number].  Exp: 06/2011.    Anticoagulation Management Assessment/Plan:      The patient's current anticoagulation dose is Coumadin 5 mg  tabs: use asd, Warfarin sodium 5 mg tabs: Use as directed by Anticoagulation Clinic.  The target INR is 2.5-3.5.  The next INR is due 08/26/2010.  Anticoagulation instructions were given to patient.  Results were reviewed/authorized by Bethena Midget, RN, BSN.  He was notified by Bethena Midget, RN, BSN.         Prior Anticoagulation Instructions: INR 2.7 Continue taking 1/2 tablet everyday, except take 1 tablet on Mondays and Fridays. Recheck in 2 weeks.  Current Anticoagulation Instructions: INR 2.9 Continue 1/2 pill everyday except 1 pill on Mondays and Fridays. Recheck in 4 weeks.

## 2010-08-18 ENCOUNTER — Encounter: Payer: Self-pay | Admitting: Internal Medicine

## 2010-08-18 DIAGNOSIS — I495 Sick sinus syndrome: Secondary | ICD-10-CM

## 2010-08-26 ENCOUNTER — Ambulatory Visit (INDEPENDENT_AMBULATORY_CARE_PROVIDER_SITE_OTHER): Payer: Medicare Other | Admitting: *Deleted

## 2010-08-26 DIAGNOSIS — I359 Nonrheumatic aortic valve disorder, unspecified: Secondary | ICD-10-CM

## 2010-08-26 DIAGNOSIS — Z8679 Personal history of other diseases of the circulatory system: Secondary | ICD-10-CM

## 2010-08-26 DIAGNOSIS — Z954 Presence of other heart-valve replacement: Secondary | ICD-10-CM

## 2010-08-26 DIAGNOSIS — I4891 Unspecified atrial fibrillation: Secondary | ICD-10-CM

## 2010-08-26 NOTE — Patient Instructions (Signed)
Coumadin 5mg  tab Take 1 tab on MON and FRI 1/2 tab on all other days Recheck INR in 4 weeks

## 2010-08-26 NOTE — Cardiovascular Report (Signed)
Summary: TTM   TTM   Imported By: Roderic Ovens 08/22/2010 15:46:40  _____________________________________________________________________  External Attachment:    Type:   Image     Comment:   External Document

## 2010-08-28 ENCOUNTER — Other Ambulatory Visit: Payer: Self-pay | Admitting: Internal Medicine

## 2010-08-28 NOTE — Telephone Encounter (Signed)
Lorazepam 1 mg tab  30 tab take 1/2 tablet once daily as needed last refill 07-10-10

## 2010-08-29 ENCOUNTER — Encounter: Payer: Self-pay | Admitting: Internal Medicine

## 2010-08-29 DIAGNOSIS — A0472 Enterocolitis due to Clostridium difficile, not specified as recurrent: Secondary | ICD-10-CM | POA: Insufficient documentation

## 2010-08-29 NOTE — Telephone Encounter (Signed)
Ok to refill x 3 

## 2010-08-29 NOTE — Telephone Encounter (Signed)
rx telephoned to pharmacist at [facility] [phone]

## 2010-08-29 NOTE — Telephone Encounter (Deleted)
rx for Lorazepam faxed to [facility] [phone]

## 2010-08-29 NOTE — Telephone Encounter (Signed)
Is it okay to refill the Lorazepam for this patient

## 2010-09-01 ENCOUNTER — Telehealth: Payer: Self-pay | Admitting: *Deleted

## 2010-09-01 MED ORDER — VALSARTAN 160 MG PO TABS: 160.0000 mg | ORAL_TABLET | Freq: Every day | ORAL | Status: DC

## 2010-09-01 NOTE — Telephone Encounter (Signed)
Received fax request from [name] for Diovan 160mg  1 tablet daily. LR 08/27/09 #90 x 3 refills. Pt was due for follow up in January 2012. Will give 30 day supply. Pt scheduled f/u for 10/02/10 @ 10:30 for follow up with Dr Artist Pais.

## 2010-09-03 ENCOUNTER — Encounter (INDEPENDENT_AMBULATORY_CARE_PROVIDER_SITE_OTHER): Payer: Medicare Other

## 2010-09-03 DIAGNOSIS — Z48812 Encounter for surgical aftercare following surgery on the circulatory system: Secondary | ICD-10-CM

## 2010-09-03 DIAGNOSIS — I714 Abdominal aortic aneurysm, without rupture: Secondary | ICD-10-CM

## 2010-09-04 ENCOUNTER — Other Ambulatory Visit: Payer: Self-pay | Admitting: Vascular Surgery

## 2010-09-04 ENCOUNTER — Telehealth: Payer: Self-pay | Admitting: Cardiology

## 2010-09-04 DIAGNOSIS — I714 Abdominal aortic aneurysm, without rupture: Secondary | ICD-10-CM

## 2010-09-04 NOTE — Telephone Encounter (Signed)
Pt's dtr said pt has to have a ct done, she thought he already did one at our office, if so wants [place] to call dr dickson's office [phone] to let them know so he won't have to repeat it

## 2010-09-04 NOTE — Telephone Encounter (Signed)
Spoke with dr [employer] office, they are repeating a ct of the abd and pelvis for grafting size. He would need to have a ct repeated even if he had a ct done here.

## 2010-09-08 NOTE — Telephone Encounter (Signed)
Spoke with pt, pt states ct scan is scheduled with dickson's office.

## 2010-09-09 ENCOUNTER — Telehealth: Payer: Self-pay | Admitting: *Deleted

## 2010-09-09 NOTE — Telephone Encounter (Signed)
Patients daughter states patient is having pain under his right rib cage that passes across to his left side, with it being more severe on his right side. She state patient has been taking prilosec, and it has not been helping. The pain started yesterday.  She states patient is scheduled for Catscan in the morning. And follow up with Dr Durwin Nora after for comparison. She was advised to contact Dr Dion Saucier office to see if he would need the CT sooner, and advised to have patient evaluated in the ER. She states she would prefer not to take the patient to the ER, and will contact Dr Dion Saucier office. Annice Pih stated after speaking with Dr Durwin Nora and if he feels patient will need to seek care in the ER she will take him to [facility]. She stated she would call back with update

## 2010-09-10 ENCOUNTER — Ambulatory Visit
Admission: RE | Admit: 2010-09-10 | Discharge: 2010-09-10 | Disposition: A | Discharge status: Home / Self Care | Payer: Medicare Other | Source: Ambulatory Visit | Attending: Vascular Surgery | Admitting: Vascular Surgery

## 2010-09-10 ENCOUNTER — Ambulatory Visit (INDEPENDENT_AMBULATORY_CARE_PROVIDER_SITE_OTHER): Payer: Medicare Other | Admitting: Vascular Surgery

## 2010-09-10 DIAGNOSIS — I714 Abdominal aortic aneurysm, without rupture: Secondary | ICD-10-CM

## 2010-09-10 MED ORDER — IOHEXOL 300 MG/ML  SOLN
100.0000 mL | Freq: Once | INTRAMUSCULAR | Status: AC | PRN
  Administered 2010-09-10: 100 mL via INTRAVENOUS

## 2010-09-11 NOTE — Assessment & Plan Note (Signed)
OFFICE VISIT  DHAIRYA, CORALES DOB:  02-18-1928                                       09/10/2010 [id number]#:[id number]  I saw this patient in the office today for continued follow-up of his aneurysm.  He underwent endovascular repair of an infrarenal abdominal aortic aneurysm by Dr. Madilyn Fireman in 2001 with an AneuRx device.  He comes in for a yearly follow-up visit.  Today he has been complaining of some back pain which began several weeks ago mostly on the right side in the lower chest.  He has also had some sweats at night.  He is unaware of any fever.  The pain seems to be worse in the morning and gets better during the day.  He has had no abdominal pain.  PAST MEDICAL HISTORY:  Significant for coronary artery disease, hypertension, history of osteoarthritis, history of COPD and also hypercholesterolemia.  REVIEW OF SYSTEMS:  CARDIOVASCULAR:  He had had no chest pain, chest pressure, palpitations or arrhythmias.  He has had no history of DVT or phlebitis. PULMONARY:  He has had no productive cough bronchitis, asthma or wheezing. MUSCULOSKELETAL:  He does have some generalized arthritis.  PHYSICAL EXAMINATION:  This is a pleasant 77 year old gentleman who appears his stated age.  Blood pressure 146/88, heart rate is 70, respiratory rate is 18.  HEENT:  Unremarkable.  Lungs:  Clear bilaterally to auscultation without rales, rhonchi or wheezing. Cardiovascular exam:  I do not detect any carotid bruits.  He has a regular rate and rhythm.  He has palpable femoral pulses.  Both feet appear adequately perfused.  He has hyperpigmentation bilaterally.  He has mild bilateral lower extremity swelling.  Musculoskeletal exam: There are no major deformities or cyanosis.  Neurologic:  He has no focal weakness or paresthesias.  I have independently interpreted his CT of his abdomen.  By my interpretation, the maximum diameter of the aneurysm is 3.2 cm and is thus not  changed compared to his previous study.  He had had a duplex in our office in March 2011.  The radiologist interpreted the CT as possibly showing an opacity peripherally in the right lower lobe which could potentially be an early pneumonia although this also may be chronic.  In addition I noted small gallstones.  Given that has been having some shortness of breath with lower right- sided chest pain and night sweats,  I have encouraged him to follow up with Dr. Artist Pais to be sure he is not developing an early pneumonia.  He had had a chest x-ray in Dr. Olegario Messier office approximately a month ago according to the patient which did not show pneumonia.  With respect to his aneurysm; however, there is no evidence of endoleak from his stent graft and the stent graft is in good position.  The aneurysm has not changed in size and is very small.  I think at this point it is safe to follow with ultrasound and  I will see him back in 1 year.  I have ordered a duplex of the stent graft at that time.  He knows to call sooner if he has problems.    Di Kindle. Edilia Bo, M.D. Electronically Signed  CSD/MEDQ  D:  09/10/2010  T:  09/11/2010  Job:  [id number]  cc:   Barbette Hair. Artist Pais, DO

## 2010-09-11 NOTE — Procedures (Unsigned)
DUPLEX ULTRASOUND OF ABDOMINAL AORTA  INDICATION:  Abdominal aortic aneurysm stent.  HISTORY: Diabetes:  No. Cardiac:  No. Hypertension:  Yes. Smoking:  Previous. Connective Tissue Disorder: Family History:  No. Previous Surgery:  Abdominal aortic aneurysm stent repair on 08/12/1999 by Dr. Madilyn Fireman.  DUPLEX EXAM:         AP (cm)                   TRANSVERSE (cm) Proximal             2.4 cm                    2.4 cm Mid                  2.4 cm                    2.1 cm Distal               4.4 cm                    3.9 cm Right Iliac          Not visualized            Not visualized Left Iliac           Not visualized            Not visualized  PREVIOUS:  Date: 09/05/2009  AP:  3.52  TRANSVERSE:  3.33  IMPRESSION: 1. Patent abdominal aortic aneurysm stent with no evidence of stenosis     or endoleak. 2. Significant increase in the maximum diameter of the distal     abdominal aorta noted when compared to the previous exam. 3. Unable to adequately visualize the bilateral common iliac arteries     due to overlying bowel gas patterns.  ___________________________________________ Di Kindle. Edilia Bo, M.D.  CH/MEDQ  D:  09/03/2010  T:  09/03/2010  Job:  [id number]

## 2010-09-20 ENCOUNTER — Other Ambulatory Visit: Payer: Self-pay | Admitting: Cardiology

## 2010-09-20 ENCOUNTER — Telehealth: Payer: Self-pay | Admitting: Physician Assistant

## 2010-09-20 NOTE — Telephone Encounter (Signed)
Pt stated was out of Coreg w/ no refills. Called Rx in to [facility] in [place] as he requested.

## 2010-09-20 NOTE — Telephone Encounter (Signed)
Pt called because was out of Coreg and had no refills. Called Coreg 25 bid in to [facility] in [place]. No current Sx.

## 2010-09-23 ENCOUNTER — Encounter: Payer: Medicare Other | Admitting: *Deleted

## 2010-09-24 ENCOUNTER — Encounter: Payer: Self-pay | Admitting: Internal Medicine

## 2010-09-25 ENCOUNTER — Ambulatory Visit (INDEPENDENT_AMBULATORY_CARE_PROVIDER_SITE_OTHER): Payer: Medicare Other | Admitting: *Deleted

## 2010-09-25 ENCOUNTER — Ambulatory Visit (INDEPENDENT_AMBULATORY_CARE_PROVIDER_SITE_OTHER): Payer: Medicare Other | Admitting: Internal Medicine

## 2010-09-25 ENCOUNTER — Encounter: Payer: Self-pay | Admitting: Internal Medicine

## 2010-09-25 DIAGNOSIS — E7800 Pure hypercholesterolemia, unspecified: Secondary | ICD-10-CM

## 2010-09-25 DIAGNOSIS — Z954 Presence of other heart-valve replacement: Secondary | ICD-10-CM

## 2010-09-25 DIAGNOSIS — Z8679 Personal history of other diseases of the circulatory system: Secondary | ICD-10-CM

## 2010-09-25 DIAGNOSIS — I4891 Unspecified atrial fibrillation: Secondary | ICD-10-CM

## 2010-09-25 DIAGNOSIS — Z950 Presence of cardiac pacemaker: Secondary | ICD-10-CM

## 2010-09-25 DIAGNOSIS — I359 Nonrheumatic aortic valve disorder, unspecified: Secondary | ICD-10-CM

## 2010-09-25 DIAGNOSIS — I495 Sick sinus syndrome: Secondary | ICD-10-CM

## 2010-09-25 LAB — POCT INR: INR: 3.5

## 2010-09-25 NOTE — Progress Notes (Signed)
HPI [name] returns today for followup. He is a pleasant elderly man with a history of hypertension and atrial fibrillation. He underwent permanent pacemaker insertion secondary to bradycardia. He denies chest pain or shortness of breath today. He complains of insomnia. No syncope or peripheral edema. His appetite and weight are stable.He denies any recent falls. No bleeding problems. No Known Allergies   Current Outpatient Prescriptions  Medication Sig Dispense Refill  . amLODipine (NORVASC) 5 MG tablet Take 5 mg by mouth daily.        . carvedilol (COREG) 25 MG tablet Take one (1) tablet(s) twice daily  60 tablet  11  . digoxin (LANOXIN) 0.125 MG tablet Take 125 mcg by mouth daily.        Marland Kitchen LORazepam (ATIVAN) 1 MG tablet Take 0.5 mg by mouth daily as needed.        Marland Kitchen omeprazole (PRILOSEC) 20 MG capsule Take 20 mg by mouth daily. For reflux       . simvastatin (ZOCOR) 20 MG tablet Take 20 mg by mouth at bedtime.        . valsartan (DIOVAN) 160 MG tablet Take 1 tablet (160 mg total) by mouth daily.  30 tablet  1  . warfarin (COUMADIN) 5 MG tablet Take by mouth as directed.        Marland Kitchen DISCONTD: Multiple Vitamins-Minerals (EYE VITAMINS PO) Take by mouth daily.       . finasteride (PROPECIA) 1 MG tablet Take 5 mg by mouth daily.        Marland Kitchen nystatin (MYCOSTATIN) ointment Apply topically 2 (two) times daily. Apply twice daily to buttocks/rash until healed          Past Medical History  Diagnosis Date  . CAD (coronary artery disease)   . COPD (chronic obstructive pulmonary disease)   . C. difficile colitis     recurrent diarrhea  . Hyperlipidemia   . HTN (hypertension)   . Osteoarthritis   . BPH (benign prostatic hypertrophy)   . Low back pain   . Lumbar disc disease   . CHF (congestive heart failure)   . Permanent atrial fibrillation   . Colonic polyp   . Diverticulosis of colon   . Anxiety   . GERD (gastroesophageal reflux disease)   . PVD (peripheral vascular disease)   . TIA  (transient ischemic attack)   . Diarrhea     recurrent diarrhea    ROS:   All systems reviewed and negative except as noted in the HPI.   Past Surgical History  Procedure Date  . Coronary artery bypass graft   . Pacemaker placement     permanent  . Abdominal aortic aneurysm repair     with intraluminal graft  . Aortic valve replacement     s/p   . Retinal detachment surgery     repair of right eye 9/211- Dr Luciana Axe     Family History  Problem Relation Age of Onset  . Hypertension Mother   . Stroke Father     died of stroke  . Stroke Sister     died of stroke  . Cancer Other     wife diagnosed with advanced bladder cancer-deceased     History   Social History  . Marital Status: Widowed    Spouse Name: N/A    Number of Children: N/A  . Years of Education: N/A   Occupational History  . Not on file.   Social History Main Topics  . Smoking  status: Former Games developer  . Smokeless tobacco: Not on file  . Alcohol Use: No  . Drug Use: Not on file  . Sexually Active: Not on file   Other Topics Concern  . Not on file   Social History Narrative   Former Smoker  Alcohol use-noWidowed              BP 110/42  Pulse 64  Ht 5\' 8"  (1.727 m)  Wt 175 lb (79.379 kg)  BMI 26.61 kg/m2  Physical Exam:  Well appearing NAD HEENT: Unremarkable Neck:  No JVD, no thyromegally Lymphatics:  No adenopathy Back:  No CVA tenderness Lungs:  Clear. Well-healed pacemaker incision. HEART:  Regular rate rhythm, no murmurs, no rubs, no clicks Abd:  Flat, positive bowel sounds, no organomegally, no rebound, no guarding Ext:  2 plus pulses, no edema, no cyanosis, no clubbing Skin:  No rashes no nodules Neuro:  CN II through XII intact, motor grossly intact  DEVICE  Normal device function.  See PaceArt for details.   Assess/Plan:

## 2010-09-25 NOTE — Assessment & Plan Note (Signed)
He will continue his statin therapy. He will maintain a low-fat diet. 

## 2010-09-25 NOTE — Assessment & Plan Note (Signed)
His device is working normally. We will recheck in several months. 

## 2010-09-25 NOTE — Assessment & Plan Note (Signed)
His ventricular rate appears to be well-controlled. He will continue his current medications. 

## 2010-09-25 NOTE — Patient Instructions (Signed)
Your physician wants you to follow-up in: 12 months with Dr. Taylor. You will receive a reminder letter in the mail two months in advance. If you don't receive a letter, please call our office to schedule the follow-up appointment.    

## 2010-09-29 ENCOUNTER — Telehealth: Payer: Self-pay | Admitting: Pharmacist

## 2010-09-29 NOTE — Telephone Encounter (Signed)
Spoke with pt.  He is pending lens replacement surgery on 5/2 with Dr. Luciana Axe.  Verified with Dr. Ephriam Knuckles office that patient does not have to hold Coumadin but they would like INR prior to procedure.  Have set pt up with INR appt on 5/1.  He is aware to continue his current dose of Coumadin.

## 2010-10-02 ENCOUNTER — Ambulatory Visit (INDEPENDENT_AMBULATORY_CARE_PROVIDER_SITE_OTHER): Payer: Medicare Other | Admitting: Internal Medicine

## 2010-10-02 ENCOUNTER — Encounter: Payer: Self-pay | Admitting: Internal Medicine

## 2010-10-02 DIAGNOSIS — E785 Hyperlipidemia, unspecified: Secondary | ICD-10-CM

## 2010-10-02 DIAGNOSIS — I4891 Unspecified atrial fibrillation: Secondary | ICD-10-CM

## 2010-10-02 DIAGNOSIS — K8020 Calculus of gallbladder without cholecystitis without obstruction: Secondary | ICD-10-CM

## 2010-10-02 DIAGNOSIS — I10 Essential (primary) hypertension: Secondary | ICD-10-CM

## 2010-10-02 LAB — HEPATIC FUNCTION PANEL
Albumin: 4.1 g/dL (ref 3.5–5.2)
Total Bilirubin: 1 mg/dL (ref 0.3–1.2)
Total Protein: 6.6 g/dL (ref 6.0–8.3)

## 2010-10-02 LAB — CBC
MCH: 32.3 pg (ref 26.0–34.0)
MCV: 98.8 fL (ref 78.0–100.0)
Platelets: 184 10*3/uL (ref 150–400)
RDW: 14.5 % (ref 11.5–15.5)

## 2010-10-02 LAB — LIPID PANEL
Cholesterol: 120 mg/dL (ref 0–200)
HDL: 29 mg/dL — ABNORMAL LOW (ref >39)
Triglycerides: 144 mg/dL (ref <150)

## 2010-10-02 MED ORDER — URSODIOL 250 MG PO TABS: ORAL_TABLET | ORAL | Status: DC

## 2010-10-02 MED ORDER — SIMVASTATIN 20 MG PO TABS: 20.0000 mg | ORAL_TABLET | Freq: Every day | ORAL | Status: DC

## 2010-10-02 MED ORDER — AMLODIPINE BESYLATE 5 MG PO TABS: 5.0000 mg | ORAL_TABLET | Freq: Every day | ORAL | Status: DC

## 2010-10-02 NOTE — Patient Instructions (Signed)
Please complete abdominal ultrasound 1 week before your next follow up appointment

## 2010-10-03 ENCOUNTER — Encounter: Payer: Self-pay | Admitting: Internal Medicine

## 2010-10-03 LAB — BASIC METABOLIC PANEL WITH GFR
CO2: 25 mEq/L (ref 19–32)
Calcium: 9.2 mg/dL (ref 8.4–10.5)
Creat: 0.99 mg/dL (ref 0.40–1.50)
GFR, Est African American: >60 mL/min (ref >60)
Glucose, Bld: 95 mg/dL (ref 70–99)

## 2010-10-07 ENCOUNTER — Ambulatory Visit (INDEPENDENT_AMBULATORY_CARE_PROVIDER_SITE_OTHER): Payer: Medicare Other | Admitting: *Deleted

## 2010-10-07 DIAGNOSIS — I4891 Unspecified atrial fibrillation: Secondary | ICD-10-CM

## 2010-10-07 DIAGNOSIS — I359 Nonrheumatic aortic valve disorder, unspecified: Secondary | ICD-10-CM

## 2010-10-07 DIAGNOSIS — Z954 Presence of other heart-valve replacement: Secondary | ICD-10-CM

## 2010-10-07 DIAGNOSIS — Z8679 Personal history of other diseases of the circulatory system: Secondary | ICD-10-CM

## 2010-10-07 NOTE — Patient Instructions (Addendum)
Continue taking Coumadin as directed. Do not hold for eye procedure.  Take a whole tablet (5 mg) on Thursday of this week.  Then, resume taking 1/2 tablet (2.5 mg) daily, except take 1 tablet (5 mg) on Mondays and Fridays.  Recheck in 3 weeks.

## 2010-10-09 ENCOUNTER — Telehealth: Payer: Self-pay | Admitting: Internal Medicine

## 2010-10-09 MED ORDER — FINASTERIDE 1 MG PO TABS: 5.0000 mg | ORAL_TABLET | Freq: Every day | ORAL | Status: DC

## 2010-10-09 NOTE — Telephone Encounter (Signed)
Medication refill sent to pharmacy  

## 2010-10-09 NOTE — Telephone Encounter (Signed)
Pt states that he took last of finasteride yesterday. Would like med called into pharmacy this afternoon.

## 2010-10-10 ENCOUNTER — Telehealth: Payer: Self-pay | Admitting: Cardiology

## 2010-10-10 ENCOUNTER — Telehealth: Payer: Self-pay | Admitting: Internal Medicine

## 2010-10-10 NOTE — Telephone Encounter (Signed)
Tresa Endo, do you know anything about this patients xanax Rx.  He saw the patient on the 4/19 and the patient states that Jens Som was going to prescribe his Xanax but Deliah Goody is very sure this is not the case.  Thanks for looking  Marchelle Folks

## 2010-10-10 NOTE — Telephone Encounter (Signed)
Call placed to [facility] at [phone], spoke with Pharmacist he was advised okay to use 5mg  finasteride once daily per Dr Artist Pais ok.  Call returned to patient [phone], he was advised per Dr Artist Pais instructions.

## 2010-10-10 NOTE — Telephone Encounter (Signed)
Pt called requesting med be called in today he is out

## 2010-10-10 NOTE — Telephone Encounter (Signed)
Call patient - taking ursodiol should not affect his other medications

## 2010-10-10 NOTE — Telephone Encounter (Signed)
Also, pt called and wanted to know if the med ursodiol to dissolve gall stones will affect his other meds that he is on.

## 2010-10-10 NOTE — Telephone Encounter (Signed)
Spoke with pt, he states the pharm made an error [name]

## 2010-10-10 NOTE — Telephone Encounter (Signed)
Would Dr. Jens Som have filled this?  Please read pt comment.

## 2010-10-10 NOTE — Telephone Encounter (Signed)
Finasteride 1mg  tablet. Take 5 tablets(5mg  total) by mouth daily. Qty 30  Pharmacy comments:  Generic finasteride is only available in 5mg  tabs. Can we dispense 5mg  tabs with a SIG of 1 tablet daily???

## 2010-10-10 NOTE — Telephone Encounter (Signed)
Pt states dr Jens Som was going to call in xanax yesterday. Pt would like xanax to [facility] # [phone]

## 2010-10-21 NOTE — Assessment & Plan Note (Signed)
OFFICE VISIT   [name], [name]  DOB:  [birth year]                                       02/24/2007  MVHQI#:[id number]   Patient returns today for surveillance of AAA stent graft placed in  2001.  CT scan performed at this time reveals no complicating factors.  AAA is thrombosed at 3.4 cm.  No evidence of endoleak.   Patient is a 77 year old male who appears stated age.  BP 132/68, pulse  64 per minute and regular, respirations 18 per minute.  Abdomen is soft  and nontender.  No masses or organomegaly.  Femoral pulses 2+.   No complicating factors related to this stent graft evident.  We will  plan followup again in one year with a duplex surveillance study.   Balinda Quails, M.D.  Electronically Signed   PGH/MEDQ  D:  02/24/2007  T:  02/26/2007  Job:  [id number]   cc:   Rosalyn Gess. Norins, MD

## 2010-10-21 NOTE — Procedures (Signed)
ENDOVASCULAR STENT GRAFT EXAM   INDICATION:  Followup abdominal aortic aneurysm repair.   HISTORY:  Status post AAA repair stent graft 2001 by Dr. Madilyn Fireman.                           DUPLEX EVALUATION   AAA Sac Size:                 3.49 CM AP            3.47 CM TRV  Previous Sac Size:            02/24/2007 (CT) 3.4 CM AP  CM TRV  Evidence of an endoleak?      No   Velocity Criteria:  Proximal Aorta                48 cm/sec  Proximal Stent Graft          53 cm/sec  Main Body Stent Graft-Mid     40 cm/sec  Right Limb-Proximal           50 cm/sec  Right Limb-Distal             45 cm/sec  Left Limb-Proximal            50 cm/sec  Left Limb-Distal              40 cm/sec  Patent Renal Arteries?        Yes   IMPRESSION:  1. Patent abdominal aortic aneurysm stent graft with no evidence of      endoleak.  2. Stable abdominal aortic aneurysm residual sac size with the largest      measurement of 3.49 cm x 3.47 cm.   ___________________________________________  P. Liliane Bade, M.D.   AS/MEDQ  D:  03/01/2008  T:  03/01/2008  Job:  [id number]

## 2010-10-21 NOTE — Assessment & Plan Note (Signed)
[facility]                            CARDIOLOGY OFFICE NOTE   [name], [name]                        MRN:          [id number]  DATE:04/24/2008                            DOB:          1927-07-24    [name] is a pleasant gentleman who has a history of coronary  disease status post coronary bypassing graft, history of aortic valve  replacement with St. Jude valve, permanent atrial fibrillation,  abdominal aortic aneurysm, and hypertension.  He apparently has had an  episode of C. diff that has been treated.  He did have significant  diarrhea, but this is now improved.  His most recent Myoview was  performed on September 15, 2005.  At that time, he was found to have an old  scar in the inferolateral wall with some peri-infarct ischemia per the  interpretation.  His ejection fraction was 43%.  The peri-infarct  ischemia appears to be mild and we have been treating medically.  Since  I last saw him, he denies any dyspnea, chest pain, palpitations, or  syncope, and there is no pedal edema.   His medications include:  1. Coumadin as directed.  2. Quinapril 40 mg p.o. daily.  3. Lanoxin 125 mcg p.o. daily.  4. Zocor 40 mg p.o. daily.  5. Lorazepam 1 mg p.o. nightly.  6. Eye drops.  7. Carvedilol 25 mg p.o. b.i.d.  8. Norvasc 5 mg p.o. daily.   PHYSICAL EXAMINATION:  VITAL SIGNS:  Today shows a blood pressure of  104/68 and his pulse is 63.  He weighs 167 pounds.  HEENT:  Normal.  NECK:  Supple.  CHEST:  Clear.  CARDIOVASCULAR:  Irregular rhythm.  There is a 2/6 systolic murmur at  the left sternal border.  There is no diastolic murmur noted.  ABDOMEN:  No tenderness.  EXTREMITIES:  Trace to 1+ edema.   His electrocardiogram shows demand ventricular pacing with underlying  atrial fibrillation.  There is a prior septal infarct.  There is also a  prior inferior infarct.   DIAGNOSES:  1. Coronary artery disease status post coronary bypassing  graft - Mr.      [name] is having no chest pain or shortness of breath.  We will      continue with medical therapy including his statin, beta-blocker,      and ACE inhibitor.  He is not on aspirin as he is on Coumadin.  2. History of aortic valve replacement with a St. Jude valve - we will      plan to repeat his echocardiogram, and he will continue with SBE      prophylaxis.  3. Coumadin therapy - our goal INR is 2-3.  We will check a CBC today.  4. Hypertension - his blood pressure is adequately controlled on his      present medications - we will check a BMET as well.  5. History of abdominal aortic aneurysm - this is being managed by Dr.      Madilyn [name] and he has had a previous intraluminal stent graft.  6. History of permanent  pacemaker placement.  7. Permanent atrial fibrillation - he will continue on his Coreg,      digoxin, and Coumadin.  8. Hyperlipidemia - he will continue on his statin.  We will check      lipids and liver.  Note, I will also check a CBC given his history      of Coumadin use.  We will see him back in 6 months.     Joshua Frieze Joshua Som, MD, [facility]  Electronically Signed    BSC/MedQ  DD: 04/24/2008  DT: 04/24/2008  Job #: [id number]

## 2010-10-21 NOTE — Assessment & Plan Note (Signed)
OFFICE VISIT   DOLTON, SHAKER  DOB:  08/26/1927                                       08/09/2008  JYNWG#:[id number]   The patient is a 77 year old gentleman who had an AneuRx stent graft  placed in 2001.  This has continued to function well.  The AAA is  thrombosed around the stent graft.  He underwent an ultrasound today.  This reveals his native aneurysm sac to be 3.6 cm in maximal diameter.  The aortic stent is widely patent without evidence of endoleak.  Patent  renal arteries bilaterally.   The patient is doing generally well.  He has had difficulty with  recurrent C. diff infections and diarrhea.   PHYSICAL EXAMINATION:  General:  He appears generally well.  Alert and  oriented.  No distress.  Vital signs:  Blood pressure is 137/81, pulse  60 per minute.  Abdomen:  Soft, nontender.  No masses or organomegaly.  Intact femoral pulses bilaterally.   The patient is doing well with a longstanding aortic stent graft.  Plan  followup in 1 year with repeat abdominal ultrasound.   Balinda Quails, M.D.  Electronically Signed   PGH/MEDQ  D:  08/09/2008  T:  08/10/2008  Job:  [id number]   cc:   Rosalyn Gess. Norins, MD

## 2010-10-21 NOTE — Assessment & Plan Note (Signed)
[facility]                            CARDIOLOGY OFFICE NOTE   Joshua, [name]                        MRN:          [id number]  DATE:11/04/2007                            DOB:          1927-11-29    [name] is a pleasant gentleman who has a history of coronary artery  disease status post coronary bypassing graft, history of aortic valve  replacement with a St. Jude valve, permanent atrial fibrillation,  abdominal aortic aneurysm, and hypertension who returns for followup  today.  Since I last saw him, he has not had chest pain, dyspnea, or  pedal edema, and there is no syncope.  However, he was recently admitted  to [facility] with pneumonia.  I do not have those records  available.  He was discharged approximately 2 weeks ago.  Over the past  week-and-half, he has had significant diarrhea.  He is having episodes  up to 5 times per day.  There is a question of blood specks.  There is  no melena.  He has not had nausea, vomiting, fevers, chills, or  abdominal pain.  He does feel weak and there is mild dizziness with  standing.   MEDICATIONS:  1. Lasix 40 mg p.o. daily.  2. Colchicine 0.6 mg p.o. b.i.d.  3. Allopurinol.  4. Coumadin.  5. Quinapril 40 mg p.o. daily.  6. Lanoxin 0.125 mg p.o. daily.  7. Zocor 40 mg p.o. daily.  8. Lorazepam 1 mg p.o. at bedtime.  9. Eye drops.  10.Carvedilol 25 mg p.o. daily.  11.Norvasc 5 mg p.o. daily.  12.Zoloft.   PHYSICAL EXAMINATION:  Today, blood pressure 110/52, pulse is 61 and  weight is 178 pounds.  HEENT:  Normal.  NECK:  Supple.  CHEST:  Clear.  CARDIOVASCULAR:  Regular rate and rhythm.  There is a crisp mechanical  valve sound.  ABDOMEN:  No tenderness.  EXTREMITIES:  No edema.   DIAGNOSES:  1. Diarrhea - Given the patient's recent pneumonia and probable      therapy with antibiotics, I am concerned about the possibility of      Clostridium difficile.  I will ask the primary  care physician to      see him today.  2. Coronary artery disease status post bypass and graft - The patient      has not had chest pain or shortness of breath, and his Myoview back      in the fall showed no ischemia.  We will continue with medical      therapy including his Coumadin, angiotensin-converting enzyme      inhibitor, statin, and beta blocker.  3. History of aortic valve replacement with St. Jude valve - He will      continue on Coumadin with a goal INR of 2-3.  He will continue with      subacute bacterial endocarditis prophylaxis.  We will check a CBC.  4. Hypertension - His blood pressure is adequately controlled.  We      will also check a basic metabolic panel for his potassium and renal  function.  5. History of abdominal aortic aneurysm, status post intraluminal      stent graft - This is being managed by Dr. Madilyn Fireman, cardiovascular-      thoracic surgery.  6. History of permanent pacemaker placement.  7. Permanent atrial fibrillation - He will continue on his Coumadin as      well as his Coreg and digoxin for rate control.  8. Hyperlipidemia - He will continue on statin.  9. Coumadin therapy - This is being managed by [facility]in Clinic.   We will see him back in 6 months.     Madolyn Frieze Jens Som, MD, [facility]  Electronically Signed    BSC/MedQ  DD: 11/04/2007  DT: 11/04/2007  Job #: [id number]

## 2010-10-21 NOTE — Procedures (Signed)
ENDOVASCULAR STENT GRAFT EXAM   INDICATION:  Follow up AAA stent.   HISTORY:  AAA.                           DUPLEX EVALUATION   AAA Sac Size:                 3.52 CM AP            3.33 CM TRV  Previous Sac Size:            3.45 CM AP            3.67 CM TRV  Evidence of an endoleak?      no                    no   Velocity Criteria:  Proximal Aorta                36 cm/sec  Proximal Stent Graft          35 cm/sec  Main Body Stent Graft-Mid     58 cm/sec  Right Limb-Proximal           66 cm/sec  Right Limb-Distal             54 cm/sec  Left Limb-Proximal            67 cm/sec  Left Limb-Distal              49 cm/sec  Patent Renal Arteries?        yes                   yes   IMPRESSION:  1. Patent endovascular stent with no evidence of leak.  2. Ankle-brachial indices within normal limits.   ___________________________________________  Di Kindle. Edilia Bo, M.D.   CJ/MEDQ  D:  09/05/2009  T:  09/05/2009  Job:  [id number]

## 2010-10-21 NOTE — Assessment & Plan Note (Signed)
[facility]                         ELECTROPHYSIOLOGY OFFICE NOTE   GABERIEL, YOUNGBLOOD                        MRN:          [id number]  DATE:08/28/2008                            DOB:          [birth year]    Mr. Brune returns today for followup.  He is a very pleasant male with  a history of symptomatic bradycardia, history of pacemaker insertion,  and a history of hypertension.  He denies chest pain, he denies  shortness of breath.  He returns today for followup.  The patient notes  that he has had a prolonged period of diarrhea.   MEDICATIONS:  1. Coumadin as directed.  2. Quinapril 40 a day.  3. Lanoxin 0.125 a day.  4. Simvastatin 40 a day.  5. Lorazepam p.r.n.  6. Carvedilol 25 twice daily.   PHYSICAL EXAMINATION:  GENERAL:  He is a pleasant, well-appearing man in  no distress.  VITAL SIGNS:  Blood pressure today was 115/75, the pulse was 60 and  regular, respirations were 18.  NECK:  No jugular venous distention.  LUNGS:  Clear bilaterally to auscultation.  No wheezes, rales, or  rhonchi are present.  CARDIOVASCULAR:  Regular rate and rhythm.  Normal S1 and S2.  ABDOMEN:  Soft, nontender.  EXTREMITIES:  No edema.   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  P-  waves were 1, the R-waves were 10, the impedance was 328 in the atrium,  446 in the ventricle.  The underlying threshold in the A could not be  obtained secondary to AFib, the V threshold was 0.625 at 0.4 in the  right ventricle.  Battery voltage was 2.79 volts.  He was 33% V-paced.   IMPRESSION:  1. Symptomatic bradycardia.  2. Status post pacemaker insertion.  3. Diarrhea, which is resolved.  4. History of pneumonia.   DISCUSSION:  Mr. Parmer is stable.  His pacemaker is working normally.  He is in atrial fibrillation.  He will continue with his Coumadin.  We  will see the patient back in the office in 1 year for pacemaker  followup.    Doylene Canning. Ladona Ridgel, MD  Electronically Signed   GWT/MedQ  DD: 08/28/2008  DT: 08/29/2008  Job #: [id number]

## 2010-10-21 NOTE — Assessment & Plan Note (Signed)
[name]                            CARDIOLOGY OFFICE NOTE   Joshua, [name]                        MRN:          [id number]  DATE:08/15/2007                            DOB:          March 30, 1928    [name] is a very pleasant gentleman with a history of coronary  disease status post coronary bypass graft, history of aortic valve  replacement with a St. Jude valve, permanent fibrillation, abdominal  aortic aneurysm and hypertension, who returns for follow-up.  When I  last saw him in September 2008, we scheduled him to have a Myoview.  This was performed on February 25, 2007.  At that time his ejection  fraction was noted to be 51%.  There was a small prior inferolateral  infarct at the base but there was no ischemia.  Also note, he had an  echocardiogram performed on February 25, 2007.  His ejection fraction  was in the 40-45% range.  There was a normally-functioning prosthetic  valve.  There was mild mitral regurgitation.  There was biatrial  enlargement.  Since he was last seen he denies any dyspnea, chest pain,  palpitations, presyncope, syncope, palpitations or edema.   MEDICATIONS:  1. Lasix 40 mg p.o. daily.  2. Colchicine 0.6 mg p.o. b.i.d.  3. Allopurinol.  4. Coumadin as directed and followed in our Coumadin clinic.  5. Quinapril 40 mg p.o. daily.  6. Lanoxin 0.5 mg p.o. daily.  7. Zocor 40 mg p.o. daily.  8. Lorazepam 1 mg p.o. nightly.  9. Eye drops.  10.Carvedilol 25 mg p.o. b.i.d.  11.Norvasc 5 mg p.o. daily.   His physical exam today shows a blood pressure of 115/50 and his pulse  is 59.  He weighs 191 pounds.  HEENT:  Normal.  NECK:  Supple.  CHEST:  Clear.  CARDIOVASCULAR:  An irregular rhythm.  There is a crisp mechanical valve  sound without a diastolic murmur.  ABDOMEN:  No tenderness.  EXTREMITIES:  No edema.   His electrocardiogram shows atrial fibrillation with occasional  ventricular pacing.  There is  evidence of a prior anterior infarct as  well as inferior infarct.   DIAGNOSES:  1. Coronary disease, status post coronary bypass graft:  Mr. Walmsley      is doing well with no chest pain or shortness of breath.  His      recent Myoview showed no ischemia.  We will continue with medical      therapy including his Coumadin, ACE inhibitor, statin and beta      blocker.  2. History of aortic valve replacement with a St. Jude valve:  He will      continue on his Coumadin with a goal INR of 2.5-3, and this is      being monitored in our Coumadin Clinic.  He also understands      subacute bacterial endocarditis prophylaxis.  3. Hypertension:  His blood pressure is adequately controlled.  We      will have a BMET checked to follow his potassium and renal      function.  4. History abdominal aortic aneurysm, status post intraluminal stent      graft:  This is being managed by Dr. Madilyn Fireman and CVTS.  5. History of permanent pacemaker placement.  6. Permanent atrial fibrillation:  He will continue on his Coumadin as      described above.  He will also continue on Coreg and digoxin for      rate control.  7. Hyperlipidemia:  He will continue on his statin and we will check      lipids and liver and adjust as indicated.  8. Coumadin therapy.   He will see [place] back in 6 months.     Madolyn Frieze Jens Som, MD, [facility]  Electronically Signed    BSC/MedQ  DD: 08/15/2007  DT: 08/16/2007  Job #: [id number]

## 2010-10-21 NOTE — Assessment & Plan Note (Signed)
[facility]                                 ON-CALL NOTE   [name], EMBERSON                        MRN:          [id number]  DATE:12/09/2007                            DOB:          1928-05-23    PRIMARY CARDIOLOGIST:  Madolyn Frieze. Jens Som, MD, [facility]   SUPERVISING CARDIOLOGIST:  Duke Salvia, MD, [facility]   HISTORY:  [name] called this morning stating that he will run out  of his medication, and he was not aware that there were no refills, and  so his pharmacy called attention to that.  I called in to [facility] at  [phone].  Refill authorization for Coreg 25 mg p.o. b.i.d. 6 refills,  60 tablets.      Joellyn Rued, PA-C  Electronically Signed      Duke Salvia, MD, [facility]  Electronically Signed   EW/MedQ  DD: 12/09/2007  DT: 12/09/2007  Job #: [id number]

## 2010-10-21 NOTE — Assessment & Plan Note (Signed)
[facility]                            CARDIOLOGY OFFICE NOTE   [name], [name]                        MRN:          [id number]  DATE:02/10/2007                            DOB:          [birth year]    [name] is a very pleasant gentleman who is 77 years old and  previously followed by [name], who returns for followup.  The  patient is status post coronary artery bypass grafting as well as aortic  valve replacement with a St. Jude aortic valve in 1993.  He has a  history of atrial fibrillation and is status post pacemaker placement  secondary to sick sinus syndrome.  He also has hypertension and  hyperlipidemia, and has had a prior EndoStent placed for an abdominal  aortic aneurysm, and this is followed by [name].  His most recent  Myoview was performed on September 15, 2005.  At that time, his ejection  fraction was 43%.  There was a prior inferolateral infarct with mild  peri-infarct ischemia.  His most recent echocardiogram was performed on  September 22, 2005.  His ejection fraction was 40%.  There was posterior  basal and apical hypokinesis.  There was a mechanical prosthetic aortic  valve with mild aortic root dilatation.  His atrium were dilated.  Since  he was last seen, he does have some dyspnea on exertion, which has been  a chronic issue.  There is no orthopnea, PND, or pedal edema.  He has  had no chest pain.  He has also had no syncope.  He does state that he  feels spells at times.  He is unable to describe these, but feels like  he may be going into another dimension.  This lasts for 5 to 10  minutes and resolves spontaneously.   PRESENT MEDICATIONS:  1. Lasix 40 mg p.o. daily.  2. Coumadin as directed.  3. Quinapril 40 mg p.o. daily.  4. Lanoxin 0.125 mg p.o. daily.  5. Zocor 40 mg p.o. daily.  6. Lorazepam 1 mg p.o. nightly.  7. Eye drops.  8. Carvedilol 25 mg p.o. b.i.d.  9. Aspirin 81 mg p.o. daily.   PHYSICAL EXAM:   Shows a blood pressure of 171/80, pulse is 68.  He  weighs 192 pounds.  HEENT:  Normal.  NECK:  Supple with no bruits.  CHEST:  Clear.  CARDIOVASCULAR:  Reveals an irregular rhythm.  There is a crisp  mechanical valve sound.  There is no diastolic murmur noted.  ABDOMEN:  Benign.  EXTREMITIES:  Chronic skin changes and trace edema.   DIAGNOSES:  1. Coronary artery disease status post coronary artery bypass grafting      - we will continue his medical therapy, including his aspirin,      Statin, ACE inhibitor, and beta blocker.  We will plan to repeat      his Myoview.  2. History of aortic valve replacement with a St. Jude valve - he will      continue on his Coumadin with a goal INR of 2.5 to 3.  We will  also      plan to repeat his echocardiogram to reassess this.  3. Hypertension - his blood pressure is elevated today and I will add      Norvasc 5 mg p.o. daily.  4. History of abdominal aortic aneurysm status post intraluminal stent      graft - he is scheduled to follow up with [name] next week for      this.  5. History of pacemaker placement - he is followed by Dr. Ladona Ridgel for      this.  6. Permanent atrial fibrillation - he will continue on Coumadin and      Coreg for rate control.  He is also on Lanoxin.  7. Hyperlipidemia - he will continue on his Statin.  We will check      lipids and liver today.  I will also check a BMET given his      diuretic use, as well as a BNP.  We will also check a CBC given his      Coumadin use.   I will see him back in 6 months.     Madolyn Frieze Jens Som, MD, [facility]  Electronically Signed    BSC/MedQ  DD: 02/10/2007  DT: 02/10/2007  Job #: [id number]

## 2010-10-21 NOTE — Procedures (Signed)
ENDOVASCULAR STENT GRAFT EXAM   INDICATION:  Followup abdominal aortic aneurysm repair.   HISTORY:  Status post AAA repair with stent graft in 2001 by Dr. Madilyn Fireman.                           DUPLEX EVALUATION   AAA Sac Size:                 3.45 CM AP            3.67 CM TRV  Previous Sac Size:            3.49 CM AP            CM TRV  Evidence of an endoleak?      No                    No   Velocity Criteria:  Proximal Aorta                69 cm/s  Proximal Stent Graft          63 cm/s  Main Body Stent Graft-Mid     59 cm/s  Right Limb-Proximal           80 cm/s  Right Limb-Distal             86 cm/s  Left Limb-Proximal            101 cm/s  Left Limb-Distal              203 cm/s  Patent Renal Arteries?        Yes                   Yes   IMPRESSION:  Patent abdominal aortic aneurysm stent graft with no  evidence of stenosis or endoleak.  Slight increase in velocity on left distal common iliac artery.    ___________________________________________  P. Liliane Bade, M.D.   AC/MEDQ  D:  08/09/2008  T:  08/09/2008  Job:  [id number]

## 2010-10-21 NOTE — Progress Notes (Signed)
[facility]                        PERIPHERAL VASCULAR OFFICE NOTE   BEREN, YNIGUEZ                        MRN:          [id number]  DATE:07/06/2008                            DOB:          03-10-1928    IDENTIFICATION:  The patient is a 77 year old gentleman with CAD status  post CABG in 1993 with aortic valve replacement.  Last echocardiogram in  December 2009, LVF with 35%.  Just evaluate LV function.   MUGA information, the patient's red cells were labeled using the  UltraTag method with 30 mc, technetium-99-labeled pertechnetate.  The  images were reconstructed in the anterior, lateral, and left anterior  oblique views.  LVF was calculated at 49% with hypokinesis in the distal  inferior and lateral walls.   IMPRESSION:  Rest MUGA.  LVEF 49%.  Hypokinesis in the distal  inferior/lateral walls.     Pricilla Riffle, MD, [facility]  Electronically Signed    PVR/MedQ  DD: 07/06/2008  DT: 07/07/2008  Job #: [id number]

## 2010-10-21 NOTE — Discharge Summary (Signed)
[name], [name]               ACCOUNT NO.:  [id number]   MEDICAL RECORD NO.:  [id number]          PATIENT TYPE:  INP   LOCATION:  1402                         FACILITY:  [facility]   PHYSICIAN:  Valerie A. Felicity Coyer, MDDATE OF BIRTH:  05-17-1928   DATE OF ADMISSION:  04/27/2008  DATE OF DISCHARGE:  05/02/2008                               DISCHARGE SUMMARY   DISCHARGE DIAGNOSIS:  1. Recurrent clostridium difficile colitis, symptomatically improved      for prolonged p.o. vancomycin taper optimally.  See details below.      Symptoms  improved.  2. Hypokalemia secondary to gastrointestinal loss from above, replaced      and normal.  3. Supra-therapeutic INR due to interaction with Flagyl and variable      p.o. intake.  Resolved.  Discharge INR 2.3.  Close outpatient      follow up.  4. Chronic atrial fibrillation on chronic anticoagulation.  See      details below regarding Coumadin management.  5. Acute gouty flare of the right elbow and right ankle, improved with      steroid taper.  Continue allopurinol with p.r.n. colchicine and any      possible exacerbation of diarrhea if used.  6. Anemia of chronic disease.  No evidence of acute blood loss.  7. Hypertension.  8. Dyslipidemia.  9. Coronary disease status post CABG in 1993.  10.Status post aortic valve replacement with St. Jude in 1993.  11.Status post abdominal aortic aneurysm stent and graft in 2001.  12.Status post permanent pacemaker in 1992 with redo in 2006 due to      sick sinus syndrome.  13.History of chronic obstructive pulmonary disease, no acute      exacerbation.  14.Debilitation with fall prior to admission for skilled nursing      facility rehab.   MEDICATIONS AT TIME OF DISCHARGE:  1. Vancomycin 250 mg p.o. q.i.d. times 12 additional days, and 150 mg      t.i.d. times 7 days, then 125 mg b.i.d. time 7 days, and then 125      mg daily times 7 days and then stop.  2. The patient remained on Florastor 250 mg  tablet supplement 1 p.o.      b.i.d. times 6 weeks while on vancomycin.  3. If unable to obtain vancomycin due to cost or other issues,      prescription for Flagyl has been provided.  Flagyl 500 mg t.i.d. x4      weeks or until further notice of taper by Dr. Artist Pais.  Prescription      provided to family if decline for SNF.  See details below.  4. Prednisone 10 mg taper, 4 mg daily times 1 day and 3 mg daily times      1 day, then 2 mg daily times 1 day, then 1 mg daily times 1 day,      then stop.  5. Coumadin 2.5 mg daily until followup INR to be scheduled for Monday      November 30.  This ideally will be performed at skilled nursing  facility upon the patient discharge, or if the patient is to be      discharged home, has appointment at [facility] with      Dr. Shelby Dubin at 11 a.m. for Monday, November 30th.  6. Lanoxin 0.25 mg daily.  7. Zocor 40 mg daily.  8. Ativan 1 mg nightly and p.r.n.  9. Carvedilol 25 mg b.i.d.  10.Allopurinol 100 mg daily.  11.Colchicine 0.6 mg b.i.d. p.r.n. gout symptoms.  12.Eye vitamin 1 p.o. daily.   DISPOSITION:  The patient and ideally to be discharged to [facility] in  [place], [place] where family has personally secured  confirmation of a private bed availability.  Pending appropriate  information and insurance review.  At this time, the patient is  undecided.  At this time, the patient is uncertain if she will  participate in therapy at this facility versus decision for discharge  home.  If the patient is to be discharged home and there are issues  regarding affordability of vancomycin, and hence the reason for  prescribing of both as noted above.   CONDITION ON DISCHARGE:  Medically stable.  Hemodynamic O2.  With  decrease in diarrhea symptoms having 2 to 4 formed stools daily which is  much improved.   HOSPITAL COURSE BY PROBLEM:  1. Recurrent C diff.  The patient is a 77 year old gentleman with      multiple  chronic medical issues who has had relapsing and recurrent      C diff numerous times over the past year.  On the day of admission      he was brought to his primary care physician after being found at      home having fallen and hitting the right side of his head against      bedroom furniture in the midst of uncontrolled diarrhea.  Due to      concern for injury as well as recurrent C diff, once again      relapsed, he was admitted for further evaluation and treatment.  He      was started empirically on oral Flagyl and C diff toxin was      obtained which was again positive.  Because of the relapsing nature      of his disease despite multiple previous episodes of treatment with      Flagyl.  He was seen by GI who recommends a prolonged tapering dose      of vancomycin as described above, which was begun this      hospitalization.  After discussion with the patient's daughter,      vancomycin had been prescribed before, but unable to be secured due      to financial constraints.  At this time, due to the patient's      debilitated state from recurrent illness with diarrhea, plan is for      him to go to short-term skilled nursing facility for continuation      of vancomycin therapy while undergoing physical therapy and rehab      per directed, as described above.  Should the patient family opt      instead for home rather than rehab, prescriptions for both      vancomycin and Flagyl have been provided.  I discussed with family      ideal situation of treating with vancomycin as described, though      recognizing possible financial constraints and providing for Flagyl      to  be given in lieu of vancomycin if unable to obtain.  Outpatient      followup with primary care physician, Dr. Thomos Lemons will be      arranged in the next 2 to 4 weeks for followup on this issue and      continued treatment.  2. Chronic atrial fib with Coumadin.  The patient had an elevated INR      during this  hospitalization, likely related to cotreatment with      Flagyl with variable p.o. intake.  Coumadin was held for 3 days and      was now felt safe to be resumed as an INR has returned to 2.3 after      3 days without Coumadin.  Will continue 2.5 mg daily which is      approximately half of his home dose until further followup with      Coumadin level to be arranged Monday November 11.  Should he go      home, this is arranged with [facility] at [facility], Dr. Shelby Dubin for 11 a.m. and should he be at [facility] nursing facility,      will arrange for the nursing facility to check and follow per      medical protocol at their facility.  3. Acute gout flare.  The patient experienced pain in his right elbow      and right wrist consistent with previous flares of gout.  He      reports this flare of pain symptoms occur every time he has a      relapse of his diarrhea.  He reports that he sees improvement of      this with colchicine but given concurrent problems with diarrhea,      we opted instead to treat with a short prednisone taper as      described here.  The patient has had immediate relief within the      first 24 hours of treatment.  Will also continue his home      allopurinol as prior to admission.  We have allowed colchicine      p.r.n. to remain on his med list as this has given him help before      and may be appropriate once his diarrhea symptoms have returned to      normal with treatment of C diff.  There have been no other acute      issues regarding his A fib rhythm or rate during this      hospitalization.  4. Other medical issues.  The patient's other chronic medical issues      are as listed here.  He is felt medically stable for discharge from      hospital, having achieved maximum benefit of acute hospitalization.   PHYSICAL EXAM AT TIME OF DISCHARGE:  VITAL SIGNS:  Temperature of 98.2,  blood pressure was 153/66, pulse 66, respirations 18 satting 97% room   air.  GENERAL:  He is pleasant in no acute distress.  Lung sounds are clear to  auscultation bilaterally without increased work of breathing.  No  wheeze, rhonchi or crackle.  No stridor.  CARDIOVASCULAR:  Irregular with stable rate.  No lower extremity edema  bilaterally.  Good dorsalis pedis pulses.  ABDOMEN:  Soft, nontender with good bowel sounds.  No rebound or  guarding.  No masses appreciable.  NEURO:  He is awake, alert, oriented x4.  Slightly wobbly gait  is  witnessed but no gross focal deficits.   LABORATORY DATA:  INR 2.3.  Basic metabolic normal.  Greater than 30  minutes spent on discharge planning coordination as well update with  family.      Valerie A. Felicity Coyer, MD  Electronically Signed     VAL/MEDQ  D:  05/02/2008  T:  05/02/2008  Job:  [id number]

## 2010-10-23 ENCOUNTER — Ambulatory Visit (INDEPENDENT_AMBULATORY_CARE_PROVIDER_SITE_OTHER): Payer: Medicare Other | Admitting: *Deleted

## 2010-10-23 DIAGNOSIS — I359 Nonrheumatic aortic valve disorder, unspecified: Secondary | ICD-10-CM

## 2010-10-23 DIAGNOSIS — Z8679 Personal history of other diseases of the circulatory system: Secondary | ICD-10-CM

## 2010-10-23 DIAGNOSIS — Z954 Presence of other heart-valve replacement: Secondary | ICD-10-CM

## 2010-10-23 DIAGNOSIS — I4891 Unspecified atrial fibrillation: Secondary | ICD-10-CM

## 2010-10-24 NOTE — Assessment & Plan Note (Signed)
[facility]                              CARDIOLOGY OFFICE NOTE   SUZANNE, GARBERS                        MRN:          [id number]  DATE:04/13/2006                            DOB:          16-Dec-1927    Mr. Witts is a very pleasant 77 year old white married male with coronary  artery disease, 14 years post CABG and aortic valve replacement with a 25 mm  St. Jude prosthesis by Dr. Particia Lather.  He had an intraluminal stent  graft for a 5 cm abdominal aneurysm by Dr. Balinda Quails in March 2001.  He  also has a pacemaker initially placed I believe in 1992.   The patient is having no cardiac symptoms.  His most recent ejection  fraction by echocardiogram was 40% in April 2007, and the most recent stress  test September 15, 2005 revealed old scar in the inferior wall with some peri-  infarct ischemia.  EF was 43%.  There was no change from May 09, 2004.  The patient is having no cardiac symptoms at this time.  He primarily  complains of fatigue and decreased energy.  I should note that he has  chronic atrial fibrillation.   MEDICATIONS:  1. Furosemide 40.  2. Colchicine.  3. Allopurinol.  4. Coumadin.  5. Potassium 20 every other day.  6. Quinapril 40.  7. Lorazepam 1 mg at bedtime.  8. Simvastatin 40.  9. Aspirin 1.  10.Lanoxin 0.125 daily.  11.Coreg CR 80.   PHYSICAL EXAMINATION:  VITAL SIGNS:  Blood pressure 164/76, pulse 68, atrial  fibrillation, with ventricular pacing on demand.  GENERAL APPEARANCE:  Unremarkable.  NECK:  JVP is not elevated.  Carotid pulses palpated, and without bruits.  LUNGS:  Clear.  CARDIAC:  Normal valve sounds, without significant murmur.  EXTREMITIES:  Revealed no edema.   EKG reveals atrial fibrillation with ventricular pacing on demand.  He has  left anterior hemiblock, old anteroseptal MI.   IMPRESSION:  Diagnoses as above.  His cardiac status appears to be stable,  and I am not sure of the  etiology of his general feeling of fatigue.  His  blood pressure is more elevated today.   I have suggested a complete evaluation by Dr. Illene Regulus, whom he has  not seen in eight months.   He will need some routine blood studies, including lipid, BMP, and thyroid  function at that time.   I will plan to see him back in six months or p.r.n.    ______________________________  E. Graceann Congress, MD, [facility]    EJL/MedQ  DD: 04/13/2006  DT: 04/14/2006  Job #: [id number]

## 2010-10-24 NOTE — Discharge Summary (Signed)
[name], [name]               ACCOUNT NO.:  [id number]   MEDICAL RECORD NO.:  [id number]          PATIENT TYPE:  INP   LOCATION:  3733                         FACILITY:  [facility]   PHYSICIAN:  Rene Paci, M.D. LHCDATE OF BIRTH:  1927/09/17   DATE OF ADMISSION:  02/02/2005  DATE OF DISCHARGE:  02/05/2005                                 DISCHARGE SUMMARY   DISCHARGE DIAGNOSES:  1.  Shortness of breath secondary to chronic obstructive pulmonary disease      exacerbation, improved.  Continue prednisone taper and empiric      antibiotics.  2.  History of sick sinus syndrome status post permanent pacemaker      reimplantation on January 30, 2005.  Status post interrogation and      cardiology evaluation and intact.  3.  History of coronary disease status post coronary artery bypass graft and      St. Jude aortic valve replacement in 1993.  4.  History of paroxysmal atrial fibrillation.  5.  History of hypertension.  6.  History of dyslipidemia.  7.  History of abdominal aortic aneurysm status post Endostent placement.  8.  History of osteoarthritis.   DISCHARGE MEDICATIONS:  1.  Avelox 400 mg daily x6 additional days to complete a 10-day course.  2.  Prednisone taper leaving on 60 mg daily to be continued for two more      days, then 40 mg x3 days, then 20 mg x3 days, then 10 mg x4 days and      then stop.  3.  Also addition of Combivent inhaler to be used two puffs 3-4 times daily      for shortness of breath.  4.  Other medications are as prior to admission without changes including      Coumadin 5 mg daily except 2.5 Monday and Friday.  Coumadin followup as      previously as scheduled.  5.  Toprol 50 mg one half tablet p.o. b.i.d.  6.  Zocor 40 mg daily.  7.  Accupril 20 mg daily.  8.  Tiazac 240 mg q. a.m. and 120 q.p.m.  9.  Lorazepam 1 mg q.h.s.  10. Baby aspirin 81 mg daily.   CONSULTATIONS:  [facility].   FOLLOW UP:  Hospital followup will be with Dr.  Landry Corporal next week February 12, 2005 at 8 a.m. to reevaluate COPD exacerbation and then with primary M.D.  Dr. Debby Bud as needed.  Also followup to be arranged with Dr. Corinda Gubler of  Cardiology in the next 2-3 weeks to further evaluate for outpatient ischemic  testing when and if needed.  Otherwise followup with [facility]  as previously scheduled per routine.   CONDITION ON DISCHARGE:  Medically improved and stable.  Tolerating room air  saturations, oral medications and anxious for discharge home.  Reviewed  plans for followup and medications with the patient and family who  understand.   HOSPITAL COURSE:  Problem 1. COPD exacerbation.  The patient is a pleasant  77 year old gentleman who underwent pacemaker exchange and reimplantation  three days prior to admission  for history of sick sinus syndrome.  Since  that time he has been having increased shortness of breath and on evaluation  in his primary M.D.'s office due to concerns with pacemaker was admitted for  further evaluation.  Cardiology saw the patient and underwent pacemaker  interrogation which found the pacemaker was intact and working  appropriately.  A 2-D echo was checked which showed no significant changes  in his LV function with EF 55-60% with baseline inferior hypokinesis and no  recommended changes, in fact it was felt the patient's shortness of breath  was more due to COPD exacerbation as he had a productive sputum treated  empirically with IV antibiotics as well as IV Solu-Medrol which has since  been changed to p.o. prednisone.  He will continue his prednisone taper as  well as p.o. antibiotics for a total of 10 days empiric treatment.  Overall  the patient's cough, shortness of breath and other cold type symptoms have  improved during his hospitalization and he is now felt stable for discharge  home.  His saturations are 98% on room air and his other vital signs are  stable.  He was started on p.o.  medications and felt stable for discharge  home.  The patient's other medical issues are as previously listed and no  other changes were made in his regimen except as listed.      Rene Paci, M.D. [facility]  Electronically Signed     VL/MEDQ  D:  02/05/2005  T:  02/05/2005  Job:  [id number]

## 2010-10-24 NOTE — Assessment & Plan Note (Signed)
[facility]                         ELECTROPHYSIOLOGY OFFICE NOTE   OCTAVE, MONTROSE                        MRN:          [id number]  DATE:07/26/2006                            DOB:          24-Jan-1928    [name] was seen today in the clinic on July 26, 2006, for  followup of his [place]. Jude model [id number] identity.  Date of implant was  January 30, 2005, for sick sinus syndrome.  On interrogation of his  device today, his battery voltage is 2.79.  P-waves measured 1.5-2.1 mV  with an atrial lead impedance of 350 ohms.  Atrial capture threshold was  not done.  He is in atrial fibrillation.  R waves were 12.5-16.6 mV with  a ventricular capture threshold of 0.75 volts at 0.4 msec and a  ventricular lead impedance of 634 ohms.  No changes were made in his  parameters.  He will continue with his telephone checks on an every 3  month basis with a return office visit in 1 year's time.      Altha Harm, LPN  Electronically Signed      Doylene Canning. Ladona Ridgel, MD  Electronically Signed   PO/MedQ  DD: 07/26/2006  DT: 07/26/2006  Job #: [id number]

## 2010-10-24 NOTE — H&P (Signed)
NAMEMARCELLA, DUNNAWAY               ACCOUNT NO.:  [id number]   MEDICAL RECORD NO.:  [id number]           PATIENT TYPE:   LOCATION:                               FACILITY:  [facility]   PHYSICIAN:  Thomos Lemons, D.O. [facility]   DATE OF BIRTH:  10-Nov-1927   DATE OF ADMISSION:  02/02/2005  DATE OF DISCHARGE:                                HISTORY & PHYSICAL   CHIEF COMPLAINT:  Shortness of breath.   HISTORY OF PRESENT ILLNESS:  The patient is a 77 year old white male with  past medical history of coronary artery disease status post CABG and AVR in  1993, hyperlipidemia, paroxysmal atrial fibrillation, who comes to see me  today for acute shortness of breath.  The patient states that he was  hospitalized on January 30, 2005, and underwent replacement of pacemaker.  The patient states on Saturday evening, he developed a severe cough and had  developed worsening shortness of breath since that time.  The patient's wife  states that he had severe coughing last night and she was afraid that he may  have developed an acute pneumonia.  The patient has a remote history of  tobacco abuse, smoked for many years, but quit in his early 73s.  The  patient does not note any associated symptoms of chest discomfort or  irregular heart beat and denies any orthopnea.  The patient also denies any  fevers and chills.  Has not been, according to the patient, hospitalized for  COPD exacerbations in the past.   PAST MEDICAL HISTORY:  1.  History of severe triple vessel coronary artery disease with aortic      stenosis status post CABG x 3 in 1993 with placement of St. Jude      prosthesis on chronic Coumadin.  2.  Paroxysmal atrial fibrillation on Coumadin therapy.  3.  Hypertension.  4.  Dyslipidemia.  5.  Osteoarthritis.  6.  Chronic obstructive pulmonary disease with remote tobacco use.  7.  History of abdominal aortic aneurysm status post endostent aneurysm      repair.   SOCIAL HISTORY:  He is retired, he lives  with his wife who has had some  difficulty with falling.   FAMILY HISTORY:  Noncontributory.   HABITS:  No alcohol, remote tobacco abuse.   CURRENT MEDICATIONS:  Tiazac 240 mg q.a.m., 120 mg q.h.s., Accupril 20 mg  daily, Coumadin as directed, Lorazepam 1 mg q.h.s., enteric coated aspirin  81 mg daily, Zocor 40 mg daily, Ocuvite once daily, Metoprolol 50 mg 1/2  tablet b.i.d.   ALLERGIES:  None.   REVIEW OF SYSTEMS:  No fevers or chills.  Patient recently evaluated for  possible TIA symptoms, he had a fall approximately two weeks ago and had  possible head injury, he fell backwards and hit his head on the commode.  He  had a recent CAT scan of his head which did not show any acute hemorrhage.  No orthopnea, no chest discomfort, no abdominal pain.  All other systems  negative.   PHYSICAL EXAMINATION:  VITAL SIGNS:  Weight 214 pounds, temperature 98.5, pulse  76, blood pressure  130/70.  His previous weight on August 23 was 218 pounds.  GENERAL:  The patient is a 77 year old white male who is in mild respiratory  distress, diaphoretic.  HEENT:  Normocephalic, atraumatic, pupils equal and reactive to light  bilaterally, extraocular movements intact, the patient was anicteric,  conjunctivae within normal limits.  NECK:  Possible JVD, no thyromegaly, no carotid bruit.  CHEST:  The patient had diffuse bilateral expiratory wheezing, no rhonchi or  rales are appreciated, with increased respiratory effort. CARDIOVASCULAR:  Irregularly irregular, positive systolic ejection murmur at the right  sternal border.  ABDOMEN:  Soft, nontender, positive bowel sounds, no organomegaly.  MUSCULOSKELETAL:  No cyanosis or clubbing, positive trace edema of the lower  extremities.  SKIN:  Diaphoretic.  NEUROLOGICAL:  Cranial nerves 2-12 grossly intact, no focal deficits.   An EKG was performed in the office which showed likely A-fib at 75 beats per  minute, the patient had left axis deviation, and  evidence of AV sequential  demand pacing.  The patient's O2 saturation was approximately 92% on room  air.   ASSESSMENT:  1.  Acute respiratory failure.  2.  History of COPD.  3.  Coronary artery disease with history of CABG x 3 in 1993 with AVR on      Coumadin.  4.  History of abdominal aortic aneurysm status post endostent.  5.  Paroxysmal atrial fibrillation.   RECOMMENDATIONS:  The patient's acute respiratory failure/COPD exacerbation  versus possible cardiac etiology with recent replacement of his pacemaker.  Reviewed an urgent chest x-ray which did not show any infiltrate and there  was no obvious CHF on chest x-ray.  The patient will be hospitalized for  observation.  Empirically start Avelox and Solu-Medrol, provide Xopenex for  wheezing, will check a BNP, and will consult cardiology.  I am suspicious  that with his recent pacemaker change that this may have been the cause of  his change in respiratory status and his pacemaker will need to be  interrogated.  The patient will be continued on his home medications.      Thomos Lemons, D.O. [facility]  Electronically Signed     RY/MEDQ  D:  02/02/2005  T:  02/02/2005  Job:  [id number]   cc:   Rosalyn Gess. Norins, M.D. [facility]  [address]  [place]  [place] [address]

## 2010-10-24 NOTE — Op Note (Signed)
[name], [name]               ACCOUNT NO.:  [id number]   MEDICAL RECORD NO.:  [id number]          PATIENT TYPE:  OIB   LOCATION:  2854                         FACILITY:  [facility]   PHYSICIAN:  Doylene Canning. Ladona Ridgel, M.D.  DATE OF BIRTH:  1928-04-27   DATE OF PROCEDURE:  01/30/2005  DATE OF DISCHARGE:                                 OPERATIVE REPORT   PROCEDURE PERFORMED:  Removal of previously implanted pacemaker generator  and insertion of a new permanent dual-chamber pacemaker.   INDICATIONS:  Symptomatic bradycardia status post pacemaker insertion, now,  approximately 13 years ago with device at elective replacement indication.   I INTRODUCTION:  The patient is a 77 year old man who has a history of  aortic valve replacement and bradycardia who is status post initial  pacemaker implant in 1993. He is now referred for removal of his previously  implanted pacemaker which has  reached elective replacement indication and  insertion of his device.   II PROCEDURE:  After informed consent was obtained the patient is taken to  the diagnostic EP lab in fasting state. After the usual preparation and  draping, intravenous fentanyl and __________ was given for sedation.  Then  30 mL of lidocaine was infiltrated into the right infraclavicular region. A  5-cm incision was carried out over the old pacemaker insertion site; and  electrocautery utilized to dissect down to the fascial plane. The pacemaker  generator was removed without difficulty. This was a Engineer, structural II.  The atrial lead was evaluated. Fibrillation waves were found to be 2 mV and  the patient impedance 495 ohms. Because of underlying atrial fibrillation,  the atrial threshold was not obtained.   Attention was then turned to the ventricular lead where R waves were 23 mV  and patient impedance was 703 ohms and the pacing threshold 0.5 volts at 0.5  milliseconds. With ventricular lead working satisfactorily the new St. Jude  Identity 29 XX LDR model 53, 86 dual-chamber pacemaker serial number [id number]  was connected to the atrial and ventricular pacing leads and placed in the  subcutaneous pocket. The pocket was irrigated with kanamycin. The incision  was closed with a layer of 2-0 Vicryl, followed by a layer of 3-0 Vicryl,  followed by a layer of 4-0 Vicryl. Benzoin was painted on the skin and Steri-  Strips were applied, and pressure dressing was placed; and the patient was  returned to his room in satisfactory condition.   III COMPLICATIONS:  There were no procedure complications.   IV RESULTS:  This demonstrates successful removal of a previous implanted St. Jude dual-  chamber pacemaker which would reached elective replacement indication; and  insertion of a new St. Jude Identity XXL dual-chamber device in a patient  with symptomatic bradycardia.           ______________________________  Doylene Canning. Ladona Ridgel, M.D.     GWT/MEDQ  D:  01/30/2005  T:  01/31/2005  Job:  [id number]   cc:   Cecil Cranker, M.D.  [address]  [address]  [place]  [place] [address]

## 2010-10-24 NOTE — Discharge Summary (Signed)
[place]. [facility]  Patient:    [name], [name]                        MRN: [id number] Adm. Date:  08/12/99 Attending:  Denman George, M.D. Dictator:   Marlowe Kays, P.A. CC:         Denman George, M.D.             Cecil Cranker, M.D. [facility]             Michael E. Norins, M.D. [facility]                           Discharge Summary  DATE OF BIRTH:  [birth year]  CHIEF COMPLAINT:  Abdominal aortic aneurysm.  HISTORY OF PRESENT ILLNESS:  [name] is a pleasant, white, married male referred by Dr. Glennon Hamilton for evaluation of AAA.  This aneurysm was found in a routine physical exam in December 1998.  An ultrasound at that point revealed a 4.5 cm aneurysm.  However, his most recent ultrasound available to US showed an aneurysm of 4.62 cm in March 2000.  Apparently, there is another ultrasound, more recent, at the [facility] but no report in the charts that are available.  The patient underwent an abdominal aortogram on June 09, 1999, which confirmed an infrarenal AAA of about 5 cm with single bilateral renal artery, a moderate tortuosity and calcification in the common iliac artery without aneurysm.  After evaluation by Dr. Madilyn Fireman, it was determined that the patient is a candidate for endovascular.  He returns to this office with no significant complaints except mild right periumbilical area of "discomfort, no pain."  He denies any back pain related to the aneurysm.  (The patient has arthritic pain in his back.)  He denies any melena or hematochezia, no nausea, vomiting, or hematemesis.  There is mild shortness of breath and dyspnea on exertion.  No chest pain, no arrhythmias.  His physical exam reveals no positive pulsatile mass or bruits.  The rest of his physical exam is unremarkable.  PAST MEDICAL HISTORY: 1. CAD status post CABG. 2. Hypertension. 3. Hypercholesterolemia. 4. Chronic Coumadin therapy. 5. AAA. 6. Osteoarthritis of the  spine, cervical spine, and lumbosacral spine, as    well as digits. 7. COPD. 8. History of postoperatively PAF in 1993. 9. Impotence.  PAST SURGICAL HISTORY: 1. Status post cataract surgery with lens implant. 2. Status post CABG x 3 in 1993, Dr. Andrey Campanile, with SVG to diagonal, SVG to the    OM, and SVG to the RCA. 3. Status post AVR in 1993 with a No. 25 St. Jude valve. 4. Status post permanent pacemaker placement secondary to A-V block. 5. Status post TEE April 04, 1992, Dr. Jacklynn Bue. 6. Status post cardiac catheterization in 1993. 7. Status post abdominal aortogram July 09, 1999. 8. Status post polypectomy. 9. Ejection fraction 45 to 50%.  MEDICATIONS: 1. Captopril 25 mg 1 p.o. b.i.d. 2. Diltia XE 240 mg 1-1/2 p.o. q.d. 3. Coumadin 5 mg 1 p.o. q.d. with the exception of Fridays, 1-1/2 p.o. q.d.    (Stopped on August 10, 1999). 4. Zocor 20 mg 1 p.o. q.d. 5. Lorazepam 1 mg 1 p.o. q.h.s. 6. Lovenox injection 80 mg, last dose on March 4 at 8 p.m. (The patient    discontinued before admission).  ALLERGIES:  No known drug allergies.  REVIEW OF SYSTEMS:  See HPI and Past Medical History for significant positives.  The rest of the Review of Systems is negative.  FAMILY HISTORY:  Significant that mother, father, and brother died of cancer, and all three had a history of stroke.  SOCIAL HISTORY:  The patient is married.  He has one daughter killed in August 2000, apparently homicide.  He has one living child.  Occupation: He is a retired Scientist, water quality.  He quit one pack a day of cigarettes at age 39, and he denies any alcohol intake.  PHYSICAL EXAMINATION:  GENERAL:  Well-developed, well-nourished, 77 year old white male in no acute distress, alert and oriented x 3.  VITAL SIGNS:  Blood pressure 150/80 bilaterally  pulse 74, respirations 18.  HEENT:  Normocephalic, atraumatic.  There is mild exotropia bilaterally, possibly related to lateral rectus muscle, no A-V nicking.   There is mild macular degeneration of the left eye as well as mild cataract changes in the same eye.  NECK:  Supple.  No JVD, bruits, thyromegaly, or lymphadenopathy.  CHEST:  Symmetrical inspirations.  No wheezes, rhonchi, or rales.  No axillary or supraclavicular lymphadenopathy.  Respirations not labored.  CARDIOVASCULAR:  Regular rate and rhythm without murmur.  There is a good valvular click, no rubs or gallops.  ABDOMEN:  Soft, nontender.  Bowel sounds present x 4.  No hepatosplenomegaly. No masses palpable or abdominal bruits.  GU/RECTAL:  Deferred.  EXTREMITIES:  No clubbing, cyanosis, and there is less than 1+ bilateral edema.  SKIN:  No ulcerations.  Warm temperature and normal hair pattern.  PERIPHERAL PULSES:  Carotid 2+ bilaterally without bruits.  Femoral 2+ bilaterally without bruits.  Popliteal, dorsalis pedis, and posterior tibialis 1+ bilaterally.  NEUROLOGIC:  Grossly normal.  Normal gait.  DTRs 2+ in the upper extremities and very decreased in the lower extremities bilaterally.  Muscle strength 5/5.  ASSESSMENT/PLAN:  A 77 year old white male with an abdominal aortic aneurysm diagnosed in 1998, now about 5 cm in diameter, who will undergo aneurysm repair by the stent/graft method by Dr. Madilyn Fireman.  Dr. Madilyn Fireman has seen and evaluated this patient prior to admission and has explained the risks and benefits involving the procedure, and the patient has agreed to continue. DD:  08/11/99 TD:  08/11/99 Job: [id number]

## 2010-10-24 NOTE — Consult Note (Signed)
NAMELAVI, SHEEHAN NO.:  [id number]   MEDICAL RECORD NO.:  [id number]          PATIENT TYPE:  INP   LOCATION:  3733                         FACILITY:  [facility]   PHYSICIAN:  Pricilla Riffle, M.D.    DATE OF BIRTH:  1927-08-26   DATE OF CONSULTATION:  02/02/2005  DATE OF DISCHARGE:                                   CONSULTATION   IDENTIFICATION:  Mr. Frayne is a 77 year old gentleman with a history of  coronary artery disease and AVR, also permanent pacemaker.  We are asked to  follow along.  The patient was admitted with shortness of breath.   HISTORY OF PRESENT ILLNESS:  The patient was recently at [facility]  on January 30, 2005 (three days ago) when he underwent a pacemaker changeout  for end of life.  A St. Jude device was placed.  He said he woke up Saturday  and was feeling short of breath, and this continued over Sunday.  He felt  feverish, though he did not take his temperature.  No chills.  Cough was  initially productive of clear sputum, then yellow sputum.  I feel like I  have a cold.  Chest pain which is pleuritic under the ribs.   ALLERGIES:  None.   MEDICATIONS:  1.  Tiazac 240 q.a.m., 120 q.p.m.  2.  Accupril 20.  3.  Lorazepam 1 mg at bedtime.  4.  Aspirin 81 mg daily.  5.  Zocor 40 daily.  6.  Metoprolol 50, 1/2 daily.  7.  Coumadin 2.5 mg Monday and Friday, otherwise 5 mg.   PAST MEDICAL HISTORY:  1.  CABG with AVR (St. Jude), 1993.  2.  History of tachy-brady syndrome, with permanent pacemaker in 1993, and      explant, reimplantation St. Jude device, 2006.  3.  Hypertension.  4.  Dyslipidemia.  5.  History of paroxysmal atrial fibrillation.  6.  History of COPD.  7.  History of osteoarthritis.  8.  History of AAA, status post endo stent placement.   PAST SURGICAL HISTORY:  1.  CABG.  2.  Permanent pacemaker.  3.  Aortic valve replacement.  4.  Aortic stent placement.   SOCIAL HISTORY:  The patient lives in [place]  with his wife.  He has  greater than a 75 pack-year history of smoking, quit 24 years ago.  Quit  drinking in 1951.   FAMILY HISTORY:  Mother died at age 46.  Father died at age 28.  No history  of CAD.   REVIEW OF SYSTEMS:  All systems were reviewed.  Negative to the above  problem except as noted previously.   PHYSICAL EXAMINATION:  GENERAL:  The patient is in minimal to mild distress  secondary to shortness of breath.  VITAL SIGNS:  BP 110 systolic; respiratory rate 20.  HEENT:  Normocephalic, atraumatic.  PERRL.  NECK:  Supple.  JVP is normal.  CHEST:  Pacer sites dry with tape.  No hematoma.  CARDIAC:  Regular rate and rhythm.  S1, S2.  No S3, no rubs.  Crisp bowel  sounds.  LUNGS:  Diffuse wheezes and pops throughout.  ABDOMEN:  No hepatomegaly.  Supple.  EXTREMITIES:  2+ pulses, 1+ edema.  chronic stasis changes.   LABORATORIES:  Pending.   CHEST X-RAY:  Per report, shows no infiltrate.   EKG:  Pending.   IMPRESSION:  The patient is a 77 year old with recent pacemaker changeout  three days ago, now with shortness of breath.  Exam is consistent with a  pulmonary exacerbation/URI.  Agree with current plan.  Will continue to  follow.  Pacer site looks good.  Will interrogate in the a.m.           ______________________________  Pricilla Riffle, M.D.     PVR/MEDQ  D:  02/02/2005  T:  02/03/2005  Job:  [id number]

## 2010-10-24 NOTE — Discharge Summary (Signed)
NAMEMASE, DHONDT               ACCOUNT NO.:  [id number]   MEDICAL RECORD NO.:  [id number]          PATIENT TYPE:  OIB   LOCATION:  2854                         FACILITY:  [facility]   PHYSICIAN:  Doylene Canning. Ladona Ridgel, M.D.  DATE OF BIRTH:  February 10, 1928   DATE OF ADMISSION:  01/30/2005  DATE OF DISCHARGE:  01/30/2005                                 DISCHARGE SUMMARY   ALLERGIES:  No known drug allergies.   DISCHARGE DIAGNOSIS:  Explantation of existing pacemaker which was implanted  in 1993 for tachy-brady syndrome and now at end of life with implantation of  St. Jude Identity model 5386 dual chamber pacemaker.  No complications post  procedure.   SECONDARY DIAGNOSES:  1.  History of severe three-vessel coronary artery disease and aortic      stenosis, status post coronary artery bypass graft surgery x3 in 1993      with placement of St. Jude prosthesis on chronic Coumadin.  2.  Post procedure atrial fibrillation and the patient has paroxysmal atrial      fibrillation.  3.  Hypertension.  4.  Dyslipidemia.  5.  Osteoarthritis.  6.  Chronic obstructive pulmonary disease/remote tobacco.  7.  History of abdominal aortic aneurysm, status post endo stent aneurysm      repair.   PROCEDURE:  January 30, 2005:  Explantation of existing permanent pacemaker  which has had an elective replacement indicator with implantation of St.  Jude identity dual chamber pacemaker by Dr. Lewayne Bunting.  The patient has  had no post procedural complications and will discharge the day of  implantation.   PHYSICAL EXAMINATION:  VITAL SIGNS:  The patient is afebrile.  Vital signs  are stable.  GENERAL:  He is alert and oriented x3.  He is not complaining of any  increased pain or swelling at the implant site.   MEDICATIONS:  He goes home on the following medications:  1.  Tiazac 240 mg daily in the morning and 120 mg daily in the evening.  2.  Accupril 20 mg daily.  3.  Lorazepam 1 mg daily at bedtime.  4.   Aspirin 81 mg daily.  5.  Zocor 40 mg daily at bedtime.  6.  Metoprolol 50 mg one half tab daily.  7.  Coumadin.  He is to take this as he has prior to this hospitalization.   WOUND CARE:  On the morning of August 26 he is to remove the bandage and  keep his incision open to the air.  He is asked to keep the incision dry for  the next seven days, sponge bathe until Friday, September 1.  He will go  home on antibiotic Keflex 500 mg p.o. q.i.d. for the next five days.   DIET:  The patient is discharged on his low sodium, low cholesterol diet.   BRIEF HISTORY AND PHYSICAL:  Mr. Donley is a 77 year old male.  He has a  history of coronary artery disease, aorto-iliac occlusive disease with stent  graft repair of an abdominal aortic aneurysm.  He has had previous coronary  artery bypass graft surgery  with concurrent replacement of the aortic valve  with a St. Jude prosthesis.  Shortly after he had __________implanted for  tachy-brady syndrome.  This was in 1993.  Interrogation of the pacemaker  shows that it is ventricular pacing with atrial fibrillation.  The patient  is having no complaints such as shortness of breath, syncope, or presyncope  or chest pain.  Interrogation of the pacemaker also shows that it is at  elective replacement indicator.   FOLLOW UP:  The patient will be referred to Dr. Ladona Ridgel for generator change  August 25.  He will also after generator change have a follow-up  echocardiogram at [facility] office.  At discharge the patient  returns Tuesday, August 29, at 10 o'clock in the morning for a Coumadin  check.  He will return Thursday, September 7, at 11:30 for echocardiogram  and then Monday, October 9, at 10 o'clock for pacer interrogation and  incision check.   HOSPITAL COURSE:  The patient presented electively August 25 and underwent  explantation of his existing pacemaker generator with implantation of a St.  Jude Identity model 5386 device.  The patient  is discharged with the  medications he has been taking prior to admission plus Keflex antibiotic  therapy for five days.      Maple Mirza, P.A.    ______________________________  Doylene Canning. Ladona Ridgel, M.D.    GM/MEDQ  D:  01/30/2005  T:  01/31/2005  Job:  [id number]   cc:   Doylene Canning. Ladona Ridgel, M.D.  [address]  [address]  [place]  [place] [address]   Rosalyn Gess. Norins, M.D. [facility]  [address]  [place]  [place] [address]

## 2010-10-24 NOTE — Assessment & Plan Note (Signed)
[facility]                            CARDIOLOGY OFFICE NOTE   DAWON, TROOP                        MRN:          [id number]  DATE:10/12/2006                            DOB:          Dec 03, 1927    [name] came in a day early for his appointment.  I do not have his  full chart.   He is a very pleasant 77 year old white married male with coronary  artery disease, 14 years post CABG and aortic valve replacement with a  25 mm  St. Jude prosthesis by Dr. Particia [name].  He has had an intraluminal  stent graft for a 5 cm abdominal aneurysm per Dr. Jake [name] in March,  2001.  He also has a pacemaker and is being followed by Dr. Lewayne [name].  I believe this was initially placed in 1992.   The patient has some decreased exercise tolerance but is having no chest  discomfort or shortness of breath. Most recent ejection fraction by echo  was 40% in April, 2007, and the most recent stress test on March 09, 2006 revealed an old scar in the inferior wall with some peri-infarct  ischemia and an EF of 43%.   He also has chronic atrial fibrillation.   MEDICATIONS:  I do not have a list of medications but I assume he is on  those from the last visit which are listed on the report of 09/06/2006  including:  Furosemide 40, Colchicine 0.6 b.i.d., Allopurinol 300, Coumadin, Coreg  6.25 b.i.d., potassium 20, quinapril 40, Digitek 0.125, Simvastatin 40,  lorazepam.   PHYSICAL EXAMINATION:  VITAL SIGNS:  Blood pressure 128/68, pulse 74,  atrial fibrillation with V-pacing.  GENERAL APPEARANCE:  Stable. JVP is not palpable. Carotid pulse palpable  without bruits.  LUNGS:  Clear.  CARDIAC EXAM:  Unremarkable.  EXTREMITIES:  Reveal 1+ edema.   DIAGNOSIS:  As noted above.   We plan to get a BMP, lipid and LFTs in a few months and I will have him  followup with Dr. Excell [name].  He is also to see Dr. Victorino [name] concerning  colonoscopy.     Joshua Cranker, MD, [facility]  Electronically Signed    EJL/MedQ  DD: 10/12/2006  DT: 10/12/2006  Job #: [id number]

## 2010-10-24 NOTE — Discharge Summary (Signed)
[place]. [facility]  Patient:    [name], [name]                        MRN: [id number] Adm. Date:  [id number] Disc. Date: [id number] Attending:  Melvenia Needles Dictator:   Areta Haber, P.A. CC:         Denman George, M.D.             Cecil Cranker, M.D. [facility]             Michael E. Norins, M.D. [facility]                           Discharge Summary  HISTORY OF PRESENT ILLNESS:  This is a 77 year old male referred to Dr. Madilyn Fireman by Dr. Corinda Gubler in regards to evaluation for abdominal aortic aneurysm.  The aneurysm was found in routine physical examination in December 1998.  He has been followed with serial ultrasound examinations.  The patient was felt to have progressed in regards to size to approximately 5 cm and was evaluated for potential AneuRx stent grafting.  Dr. Madilyn Fireman evaluated the patient and his studies, and he was admitted this hospitalization for the procedure.  PAST MEDICAL HISTORY: 1. Coronary artery disease, status post coronary artery bypass grafting. 2. Hypertension. 3. Hypercholesterolemia. 4. Chronic Coumadin therapy. 5. Abdominal aortic aneurysm. 6. Osteoarthritis. 7. COPD. 8. History of postoperative paroxysmal atrial fibrillation in 1993. 9. Impotence.  PAST SURGICAL HISTORY: 1. Cataract surgery. 2. Coronary artery bypass grafting 1993, by Dr. Andrey Campanile. 3. Aortic valve replacement in 1993, with a #25 St. Jude valve. 4. Permanent pacemaker secondary to AV block. 5. History of polypectomy.  ADMISSION MEDICATIONS: 1. Captopril 25 mg 1 p.o. b.i.d. 2. ______ 240 mg 1-1/2 tablets p.o. q.d. 3. Coumadin 5 mg p.o. q.d., with the exception of Fridays, at which time he    would take 7.5 mg q.d. 4. Zocor 20 mg 1 p.o. q.d. 5. Lorazepam 1 p.o. q.h.s. 6. Lovenox.  ALLERGIES:  No known drug allergies.  SOCIAL HISTORY, FAMILY HISTORY, REVIEW OF SYMPTOMS, PHYSICAL EXAMINATION: Please see the history and physical done at the time of  admission.  HOSPITAL COURSE:  The patient was admitted electively and was taken to the operating room by Dr. Madilyn Fireman where he underwent repair of infrarenal abdominal aortic aneurysm with AneuRx stent graft, #1 was 26 x 15 x 16.5 primary right, and #2 15 x 11.5 in left iliac.  Additionally, there was percutaneous angioplasty of bilateral iliac limbs.  Two JP drains were placed following the procedure.  Postoperative hospital course was unremarkable.  He remained hemodynamically stable.  Laboratory values remained stable with postoperative day #1 values of hemoglobin 11.1, hematocrit 32.4.  BUN 11, creatinine 1.0. The patient progressed in regard to routine activities and diet.  He was started back on Coumadin.  Ankle brachial index Doppler studies done on postoperative day #1 showed no change with normal postoperative indices. Overall, it was felt that he was quite stable for discharge on August 14, 1999.  FOLLOW-UP:  Dr. Madilyn Fireman.  Additionally, Dr. Corinda Gubler and Dr. Debby Bud.  The patient was arranged for further follow-up with the Coumadin clinic, as well as home nursing for following JP drain output.  CONDITION ON DISCHARGE:  Stable and improved.  DISCHARGE MEDICATIONS: 1. Captopril 25 mg twice daily. 2. ______ 240 mg 1-1/2 daily. 3. Zocor 20 mg daily. 4. Lorazepam 1 mg  at bedtime. 5. Lovenox 80 mg twice daily. 6. Tylenol as needed.  FINAL DIAGNOSIS:  Infrarenal abdominal aortic aneurysm.  OTHER DIAGNOSES: 1. Coronary artery disease. 2. Hypertension. 3. Hypercholesterolemia. 4. Chronic Coumadin therapy. 5. Osteoarthritis. 6. Chronic obstructive pulmonary disease. 7. History of paroxysmal atrial fibrillation. 8. History of impotence.  OTHER SURGERIES:  As previously noted. DD:  10/01/99 TD:  10/01/99 Job: [id number]

## 2010-10-24 NOTE — Op Note (Signed)
[place]  Patient:    [name], [name]                        MRN: [id number] Proc. Date: 08/12/99 Attending:  Denman George, M.D. CC:         Cecil Cranker, M.D. [facility]             Michael E. Norins, M.D. [facility]                           Operative Report  PREOPERATIVE DIAGNOSIS:  Infrarenal abdominal aortic aneurysm.  POSTOPERATIVE DIAGNOSIS:  Infrarenal abdominal aortic aneurysm.  PROCEDURE: 1. Aneurx stent graft repair of infrarenal abdominal aortic aneurysm    (26 x 15 x 16.5 primary right bifurcation device, 15 x 11.5 left iliac    limb). 2. Bilateral iliac transluminal angioplasty. 3. Intraoperative arteriography.  SURGEON:  Denman George, M.D.  ASSISTANT:  Di Kindle. Edilia Bo, M.D.  ANESTHESIA:  General endotracheal.  ANESTHESIOLOGIST:  Burna Forts, M.D.  CLINICAL NOTE:  This is a 77 year old male who is referred for evaluation of an infrarenal abdominal aortic aneurysm.  CT scan revealed this to be 5 cm in maximal diameter.  He underwent arteriography which revealed an adequate neck and iliac arteries for endovascular repair.  He is brought to the operating room at this time for Aneurx stent graft repair of the abdominal aortic aneurysm.  DESCRIPTION OF PROCEDURE:  The patient was brought to the operating room in stable condition.  Placed in the supine position.  General endotracheal anesthesia induced.  The abdomen and both legs prepped and draped in a sterile fashion.  Bilateral oblique groin skin incisions made.  Subcutaneous tissue and lymphatics divided with electrocautery.  Dissection carried down to expose the common femoral artery bilaterally.  At the inguinal ligament, the common femoral artery encircled with a vessel loop.  Distal dissection carried down to expose the profunda and superficial femoral artery origin bilaterally which were also encircled with vessel loops.  A needle was easily introduced  into the right common femoral artery.  A 0.035 J-wire passed through the needle into the mid abdominal aorta under fluoroscopic control.  An 8-French sheath then placed over the guidewire and the dilator removed.  The patient was then administered 7000 units of heparin intravenously.  A standard pigtail catheter was then advanced over the guidewire into the juxtarenal aorta.  Standard AP abdominal aortogram obtained.  This revealed single bilateral renal arteries.  Moderate tortuosity of the infrarenal neck and tapering of the aneurysm at the level of the iliac arteries bilaterally.  An infrarenal abdominal aortic aneurysm was present, consistent with 5 cm in diameter.  A needle was introduced in the left common femoral artery.  A 0.035 J-wire passed through this needle into the mid abdominal aorta.  The needle removed and an 8-French sheath passed over the guidewire into the left common femoral artery, the dilator removed, the sheath flushed with heparin and saline solution.  The right femoral pigtail catheter was then exchanged for a super stiff guidewire which was advanced into the suprarenal aorta.  The pigtail catheter removed.  The pigtail catheter was advanced over the left femoral guidewire into the suprarenal aorta and the guidewire removed.  The right femoral sheath was then removed and the right femoral vessels controlled with clamps.  A transverse arteriotomy made in the right common femoral artery.  A  21-French delivery device containing a 26 x 15 x 16.5 bifurcation stent was then advanced over the right femoral guidewire into the suprarenal aorta under fluoroscopic control.  The delivery device was then assembled with the handle.  The sheath was slowly withdrawn from the stent and the first stent ring allowed to flower.  This was in the suprarenal position.  Repeat contrast injection through the left femoral pigtail catheter verified position of the renal arteries and the  bifurcation device brought down to the immediate infrarenal position.  The sheath was then removed from the stent and the bifurcation device was delivered into the infrarenal aorta and the iliac limb delivered into the right common iliac artery.  The delivery device was then removed.  The 8-French sheath then readvanced into the right common femoral artery.  The pigtail catheter through the left femoral sheath was then withdrawn down into the aneurysm sac.  The J-wire was advanced through the pigtail catheter and using a freestyle technique, the left iliac gate area was accessed with the guidewire.  The pigtail catheter was then advanced over the guidewire into the left iliac gate area.  Position was verified with LAO and RAO oblique projections.  The super stiff guidewire was then advanced through the pigtail catheter and accessed into the left iliac gate area.  The pigtail catheter removed.  The 8-French sheath removed from the left femoral artery.  A 16-French Cook sheath and dilator were then advanced through the left femoral guidewire into the left iliac gate area.  The dilator removed.  A delivery device containing a 15 x 11.5 left iliac limb was then advanced through the left femoral sheath into the gate area.  The sheath withdrawn.  The delivery handle assembled and the left iliac limb was deployed high in the gate area, and landed into the left common iliac artery.  The left femoral delivery catheter was then removed and the 8-French sheath reinserted in the left common femoral artery.  A pigtail catheter was then advanced over the right femoral guidewire into the suprarenal aorta.  AP aortogram was obtained.  This revealed no evidence of proximal type 1 leak.  There was however narrowing of the iliac rims at the aortic bifurcation.  The pigtail catheter exchanged for the super stiff guidewire in the right femoral artery.  Bilateral power flex 12 x 2 angioplasty balloons were  then advanced over the femoral guidewires.  These were positioned in the gate area in a kissing fashion and inflated at 6 atmospheres x 30 seconds.  The power flex balloons were then brought down in a matched fashion to the aortic bifurcation and kissing technique used to inflate the balloons again at 6 atmospheres x 30 seconds.  The balloons were then removed.  Repeat aortogram was then obtained by advancement of the pigtail catheter over the right femoral guidewire.  This revealed no evidence of type 1 leak.  Also good apposition in the iliac arteries of the stent graft.  No evidence of distal type 1 leak.  This completed the procedure.  The sheaths and guidewires were removed from the femoral arteries bilaterally.  Both femoral arteries flushed.  The transverse arteriotomies were closed bilaterally with running 6-0 Prolene suture.  The groin incisions closed with a deep layer of running 2-0 Vicryl suture, subcutaneous layer with running 3-0 Vicryl suture.  The skin closed with 4-0 Monocryl in a subcuticular fashion.  Steri-Strips applied.  The patient received a total of 155 cc of ________ contrast solution.  The patient was transferred to the PACU in stable condition. DD:  12/17/99 TD:  12/18/99 Job: [id number]

## 2010-10-24 NOTE — Assessment & Plan Note (Signed)
[facility]                            CARDIOLOGY OFFICE NOTE   BURCH, MARCHUK                        MRN:          [id number]  DATE:10/12/2006                            DOB:          1927-09-20    CONTINUATION:  I do not have a list of medications but I assume he is on those from the  last visit which are listed on the report of 09/06/2006 including:  Furosemide 40, Colchicine 0.6 b.i.d., Allopurinol 300, Coumadin, Coreg  6.25 b.i.d., potassium 20, quinapril 40, Digitek 0.125, Simvastatin 40,  lorazepam.   PHYSICAL EXAMINATION:  VITAL SIGNS:  Blood pressure 128/68, pulse 74,  atrial fibrillation with V-pacing.  GENERAL APPEARANCE:  Stable. JVP is not palpable. Carotid pulse palpable  without bruits.  LUNGS:  Clear.  CARDIAC EXAM:  Unremarkable.  EXTREMITIES:  Reveal 1+ edema.   DIAGNOSIS:  As noted above.   We plan to get a BMP, lipid and LFTs in a few months and I will have him  followup with Dr. Excell Seltzer.  He is also to see Dr. Victorino Dike concerning  colonoscopy.     Cecil Cranker, MD, [facility]  Electronically Signed    EJL/MedQ  DD: 10/12/2006  DT: 10/12/2006  Job #: [id number]

## 2010-10-24 NOTE — Assessment & Plan Note (Signed)
[name]                           PRIMARY CARE OFFICE NOTE   KEARNEY, EVITT                        MRN:          [id number]  DATE:09/06/2006                            DOB:          1927/08/18    [name] is seen as a walk-in patient today for acute problem with  respiratory symptoms.  He has had coughing, congestion, mild shortness  of breath and discomfort in his rib cage area.  This has been going on  for 24 to 48 hours.  He reports that his rhinorrhea is yellow and thick  in nature.  He denies any hard fevers or rigors.   PAST MEDICAL HISTORY:  Previous chart notes include history of atrial  fibrillation, gout, hypertension, hyperlipidemia.   CURRENT MEDICATIONS:  1. Furosemide 40 mg daily.  2. Colchicine 0.6 mg b.i.d.  3. Allopurinol 300 mg daily.  4. Coumadin as directed.  5. Coreg 6.25 mg a.m. and p.m.  6. Potassium 20 mEq daily.  7. Quinapril 40 mg daily.  8. Digitek 0.125 mg daily.  9. Simvastatin 40 mg q.p.m.  10.Lorazepam 1 mg q.h.s.  11.Eye drops.   REVIEW OF SYSTEMS:  Negative for fever, positive for chills.  Ophthalmia, ENT, cardiovascular are unremarkable.  Respiratory per the  HPI.  GI is negative for nausea and vomiting or diarrhea.   PHYSICAL EXAMINATION:  Temperature was 98.1, blood pressure 109/64,  heart rate was 78, respirations were 16, O2 saturation was 94% on room  air.  GENERAL APPEARANCE:  This is a heavy-set, Caucasian gentleman, looking  slightly older than his stated chronologic age, in no acute distress.  CHEST:  The patient is moving air well.  There are no rales, wheezes, or  rhonchi, no increased work of breathing, no use of accessory muscle.  HEENT:  Face had no tenderness to percussion over his frontal or  maxillary sinuses.  CARDIOVASCULAR:  Two plus radial pulses.  Precordium was quiet.  He had  a regular rate and rhythm.  I appreciated no murmurs.   IMPRESSION AND PLAN:  Upper respiratory  infection.  The patient has no  evidence of pneumonia and no evidence of congestive heart failure.  Suspect some of this may be allergic rhinitis, although I cannot rule  out bacterial bronchitis as well.  Plan:  Septra DS, b.i.d. for 7  days, Phenergan with codeine cough syrup, Fexofenadine 16 mg twice a day  for 10 days for  management of his runny nose.  The patient may take Tylenol 500 mg 1 or  2 tablets 3 times a day as needed for chest wall pain.     Rosalyn Gess Norins, MD  Electronically Signed    MEN/MedQ  DD: 09/06/2006  DT: 09/06/2006  Job #: [id number]

## 2010-10-24 NOTE — H&P (Signed)
[name], [name]               ACCOUNT NO.:  [id number]   MEDICAL RECORD NO.:  [id number]           PATIENT TYPE:   LOCATION:                                 FACILITY:   PHYSICIAN:  Doylene Canning. Ladona Ridgel, M.D.       DATE OF BIRTH:   DATE OF ADMISSION:  DATE OF DISCHARGE:                                HISTORY & PHYSICAL   PRESENTING CIRCUMSTANCE:  I'm here for pacemaker change.   ALLERGIES:  No known drug allergies.   HISTORY OF PRESENT ILLNESS:  [name] is a 77 year old male. He has a  history of coronary artery disease and is status post coronary artery bypass  graft surgery. He has a history also of complete heart block and left bundle-  branch block and he is status post permanent pacemaker placement. The  pacemaker has been interrogated and has been shown to be at elective  replacement indicator. He will present electively for pacemaker replacement.   PAST MEDICAL HISTORY:  1.  Atrial fibrillation, currently ventricular paced.  2.  Status post endostent repair of an abdominal aortic aneurysm.  3.  Coronary artery disease status post coronary artery bypass graft surgery      in 1993 with aortic valve replacement, St. Jude prosthesis.  4.  Chronic Coumadin therapy.  5.  Peripheral vascular occlusive disease.  6.  Tachybrady syndrome.   MEDICATIONS:  1.  Tiazac 240 mg daily in the morning and 120 mg in the evening.  2.  Accupril 20 mg daily.  3.  Coumadin per medical direction.  4.  Lorazepam 1 mg at bedtime.  5.  Aspirin 81 mg daily.  6.  Zocor 40 mg daily at bedtime.  7.  Ocuvite one tablet daily.  8.  Metoprolol 25 mg daily.   REVIEW OF SYSTEMS:  The patient is not complaining of fevers, chills, night  sweats. No significant weight gain or loss in the last 6 months. HEENT:  No  epistaxis, no nasal discharge, no vertigo, no transient hoarseness or  persistent hoarseness. INTEGUMENT:  No rashes, no nonhealing ulcerations.  CARDIOPULMONARY:  No experience of chest  pain recently, mild dyspnea on  exertion, no orthopnea, no paroxysmal nocturnal dyspnea. Mild edema which is  transient and improves with exercise and keeping his legs elevated. No  history of syncope or presyncope. No palpitation. UROGENITAL:  Nocturia 2-3  times an evening. No frequency or urgency. GI:  No history of bright red  blood per rectum, no melena. No nausea, vomiting, or diarrhea currently, and  no history of gastroesophageal reflux. MUSCULOSKELETAL:  Mild arthralgias in  the knees and hips. Otherwise, no effusions or joint deformity. NEUROLOGIC:  No focal deficits. All other systems are negative.   PHYSICAL EXAMINATION:  VITAL SIGNS:  Temperature 97.6, blood pressure  117/66, pulse is 73, respirations are 18, oxygen saturation 95% on room air,  weight is 200.  GENERAL:  Alert and oriented x3, no acute distress.  HEENT:  Eyes:  Pupils equal, round, and reactive to light. Extraocular  movements intact. Nares are patent. Oropharynx shows no evidence of  lesions  or erythema.  NECK:  Supple. No carotid bruits auscultated. No jugular venous distention.  No thyromegaly.  CHEST:  Lungs clear to auscultation and percussion bilaterally.  HEART:  Regular rate and rhythm with clear and distinct prosthetic click.  ABDOMEN:  Soft, nondistended, bowel sounds are present. No  hepatosplenomegaly. Abdominal aorta is not palpated this exam.  EXTREMITIES:  Show no evidence of edema. Dorsalis pedis pulses are 2/4  bilaterally and radial pulses 4/4 bilaterally.  NEUROLOGIC:  No focal deficits.   IMPRESSION:  1.  Permanent pacemaker VVI at end of life. The patient presents for      generator change.  2.  Coronary artery disease status post coronary artery bypass graft surgery      with aortic valve replacement, St. Jude, 1993.  3.  Atrial fibrillation, chronic Coumadin.  4.  Status post endovascular stent repair of abdominal aortic aneurysm.  5.  Hypertension.  6.  Dyslipidemia.  7.   Complete heart block, left bundle-branch block, status post permanent      pacemaker placement shortly after coronary artery surgery in 1993.   PLAN:  Generator change, Dr. Lewayne Bunting.      Maple Mirza, P.A.    ______________________________  Doylene Canning. Ladona Ridgel, M.D.    GM/MEDQ  D:  05/27/2005  T:  05/28/2005  Job:  [id number]

## 2010-10-24 NOTE — Assessment & Plan Note (Signed)
[facility]                         GASTROENTEROLOGY OFFICE NOTE   [name], [name]                        MRN:          [id number]  DATE:10/13/2006                            DOB:          01/06/1928    The patient comes in for a colon check and notes having trouble with  swallowing and when he lays down at night he finds more difficulty with  reflux, heartburn and indigestion. He also has had some hemorrhoids with  rectal itching. He had a colonoscopic examination in 2002 at which time  he had several small polyps, the largest being 7 mm and some  hemorrhoids. The patient is a long-term patient of Dr. Glennon Hamilton and  had significant degree of heart disease over the years.   He is presently on furosemide, Coumadin, Klor-Con, quinapril, Digitek,  simvastatin, lorazepam, colchicine, allopurinol and vitamins.   FAMILY HISTORY:  Reveals a sister with colon cancer and colon polyps.   PAST MEDICAL HISTORY:  Reveals hypertension, hyperlipidemia, anxiety,  depression and status post heart bypass and stents.   SOCIAL HISTORY:  Noncontributory.   REVIEW OF SYSTEMS:  Revealed some fatigue, leakage of urine, itching.   PHYSICAL EXAMINATION:  VITAL SIGNS:  He is 5 feet 8, weighs 194, blood  pressure 130/72, pulse 80 and regular.  Neck, heart and extremities are all unremarkable.   IMPRESSION:  1. Status post colon polyps and diverticular disease.  2. Gastroesophageal reflux disease with some breakthrough.  3. Arteriosclerotic cardiovascular disease.  4. Pruritus ani.   RECOMMENDATIONS:  I gave him some literature on pruritus ani and some  Analpram cream with a prescription. I told him to take stool softeners  and/or a stimulant to get his bowels to move more regularly and to use  some MiraLax if need to be. I told him to try to take Prevacid or  equivalent for his GERD and I will talk to Dr. Glennon Hamilton about whether  he should have a colonoscopic  examination. He did have a CT angiogram of  the abdomen which really did not reveal anything of significance except  for some diverticular disease and it was otherwise unremarkable. I  really think that he may not be in good enough health to undergo a  followup colonoscopic examination but I will leave that up to Dr. Glennon Hamilton who knows his cardiac  status much better. In the meantime, I did give him literature on  pruritus ani and hopefully this will help him as well.     [name], MD  Electronically Signed    SML/MedQ  DD: 10/13/2006  DT: 10/13/2006  Job #: [id number]   cc:   Rosalyn Gess. Norins, MD  Cecil Cranker, MD, [facility]

## 2010-10-24 NOTE — Assessment & Plan Note (Signed)
[facility]                           ELECTROPHYSIOLOGY OFFICE NOTE   KANON, NOVOSEL                        MRN:          [id number]  DATE:12/21/2005                            DOB:          [birth year]    [name] is seen in the device clinic today, December 21, 2005, for routine  followup of his St. Jude Identity, model 5386, dual-chamber pacemaker  implanted in August 2006 for sick sinus syndrome by Dr. Ladona Ridgel.   Upon interrogation, battery voltage is 2.79 volts.  He continues to be in  atrial fibrillation.  In the atrium, intrinsic amplitude is 1 millivolt with  an impedance of 364 ohms.  In the right ventricle, intrinsic amplitude is  12.5 millivolts with an impedance of 656 ohms and threshold of 1 volt at 0.4  milliseconds.  He had eight mode-switching episodes with rates of about 140  to 150.  He remains programmed DDIR. No changes were made to his  programming.   He will be seen in clinic in six months' time for followup.                                   Cleatrice Burke, RN                                Doylene Canning. Ladona Ridgel, MD   CF/MedQ  DD:  12/21/2005  DT:  12/21/2005  Job #:  [id number]

## 2010-10-27 ENCOUNTER — Encounter: Payer: Medicare Other | Admitting: *Deleted

## 2010-10-27 ENCOUNTER — Telehealth: Payer: Self-pay | Admitting: Internal Medicine

## 2010-10-27 DIAGNOSIS — K8020 Calculus of gallbladder without cholecystitis without obstruction: Secondary | ICD-10-CM | POA: Insufficient documentation

## 2010-10-27 MED ORDER — VALSARTAN 160 MG PO TABS: 160.0000 mg | ORAL_TABLET | Freq: Every day | ORAL | Status: DC

## 2010-10-27 NOTE — Progress Notes (Signed)
Subjective:    Patient ID: [name], male    DOB: 26-Sep-1927, 77 y.o.   MRN: [id number]  Hypertension This is a chronic problem. The problem is controlled. Past treatments include angiotensin blockers, beta blockers and calcium channel blockers. There are no compliance problems.  Hypertensive end-organ damage includes CAD/MI.   Hyperlipidemia - stable  Interval hx:  Pt seen in early ER in early April for abd pain.  CT of abd and pelvis reviewed.   Pt has small gallstones.   Review of Systems Mild intermittent dizziness.  He takes his time standing. Negative for chest pain    Past Medical History  Diagnosis Date  . CAD (coronary artery disease)   . COPD (chronic obstructive pulmonary disease)   . C. difficile colitis     recurrent diarrhea  . Hyperlipidemia   . HTN (hypertension)   . Osteoarthritis   . BPH (benign prostatic hypertrophy)   . Low back pain   . Lumbar disc disease   . CHF (congestive heart failure)   . Permanent atrial fibrillation   . Colonic polyp   . Diverticulosis of colon   . Anxiety   . GERD (gastroesophageal reflux disease)   . PVD (peripheral vascular disease)   . TIA (transient ischemic attack)   . Diarrhea     recurrent diarrhea    History   Social History  . Marital Status: Widowed    Spouse Name: N/A    Number of Children: N/A  . Years of Education: N/A   Occupational History  . Not on file.   Social History Main Topics  . Smoking status: Former Games developer  . Smokeless tobacco: Not on file  . Alcohol Use: No  . Drug Use: Not on file  . Sexually Active: Not on file   Other Topics Concern  . Not on file   Social History Narrative   Former Smoker  Alcohol use-noWidowed             Past Surgical History  Procedure Date  . Coronary artery bypass graft   . Pacemaker placement     permanent  . Abdominal aortic aneurysm repair     with intraluminal graft  . Aortic valve replacement     s/p   . Retinal detachment surgery    repair of right eye 9/211- Dr Luciana Axe    Family History  Problem Relation Age of Onset  . Hypertension Mother   . Stroke Father     died of stroke  . Stroke Sister     died of stroke  . Cancer Other     wife diagnosed with advanced bladder cancer-deceased    No Known Allergies  Current Outpatient Prescriptions on File Prior to Visit  Medication Sig Dispense Refill  . carvedilol (COREG) 25 MG tablet Take one (1) tablet(s) twice daily  60 tablet  11  . digoxin (LANOXIN) 0.125 MG tablet Take 125 mcg by mouth daily.        Marland Kitchen LORazepam (ATIVAN) 1 MG tablet Take 0.5 mg by mouth daily as needed.        . nystatin (MYCOSTATIN) ointment Apply topically 2 (two) times daily. Apply twice daily to buttocks/rash until healed       . omeprazole (PRILOSEC) 20 MG capsule Take 20 mg by mouth daily. For reflux       . warfarin (COUMADIN) 5 MG tablet Take by mouth as directed.        Marland Kitchen DISCONTD: valsartan (DIOVAN) 160  MG tablet Take 1 tablet (160 mg total) by mouth daily.  30 tablet  1    BP 124/60  Pulse 59  Temp(Src) 98.1 F (36.7 C) (Oral)  Resp 18  Ht 5\' 7"  (1.702 m)  Wt 188 lb (85.276 kg)  BMI 29.44 kg/m2  SpO2 98%    Objective:   Physical Exam  Constitutional: He appears well-developed and well-nourished.  Cardiovascular: Normal rate and regular rhythm.  Exam reveals no gallop.   Pulmonary/Chest: Effort normal and breath sounds normal. He has no wheezes. He has no rales.  Musculoskeletal: He exhibits no edema.  Neurological: He is alert. No cranial nerve deficit.  Skin: Skin is warm and dry.  Psychiatric: He has a normal mood and affect. His behavior is normal.   Assessment & Plan:

## 2010-10-27 NOTE — Assessment & Plan Note (Signed)
LDL at goal.  Continue simvastatin.  Lab Results  Component Value Date   CHOL 120 10/02/2010   CHOL 99 05/17/2009   CHOL 100 11/02/2008   Lab Results  Component Value Date   HDL 29* 10/02/2010   HDL 28.70* 05/17/2009   HDL 31.50* 11/02/2008   Lab Results  Component Value Date   LDLCALC 62 10/02/2010   LDLCALC 51 05/17/2009   LDLCALC 55 11/02/2008   Lab Results  Component Value Date   TRIG 144 10/02/2010   TRIG 99.0 05/17/2009   TRIG 66.0 11/02/2008   Lab Results  Component Value Date   CHOLHDL 4.1 10/02/2010   CHOLHDL 3 05/17/2009   CHOLHDL 3 11/02/2008   No results found for this basename: LDLDIRECT

## 2010-10-27 NOTE — Telephone Encounter (Signed)
Rx refill sent to pharmacy. 

## 2010-10-27 NOTE — Telephone Encounter (Signed)
Refill- diovan 160mg  tab. Take one tablet daily. Qty 30. Last fill 4.19.12  *no refills remaining for rx*

## 2010-10-27 NOTE — Assessment & Plan Note (Signed)
Stable.  Continue current medication regimen. BP: 124/60 mmHg  Lab Results  Component Value Date   CREATININE 0.99 10/02/2010

## 2010-10-27 NOTE — Assessment & Plan Note (Signed)
Pt with intermittent RUQ pain.  CT of abd and pelvis showed gallstones.  Pt would like to avoid surgery if possible.  We discussed symptoms of acute cholecystitis. Trial of actigall to see we can dissolve gallstones.

## 2010-11-17 ENCOUNTER — Encounter: Payer: Medicare Other | Admitting: *Deleted

## 2010-11-17 ENCOUNTER — Ambulatory Visit (INDEPENDENT_AMBULATORY_CARE_PROVIDER_SITE_OTHER): Payer: Medicare Other | Admitting: *Deleted

## 2010-11-17 ENCOUNTER — Encounter: Payer: Self-pay | Admitting: Internal Medicine

## 2010-11-17 DIAGNOSIS — I4891 Unspecified atrial fibrillation: Secondary | ICD-10-CM

## 2010-11-17 DIAGNOSIS — Z954 Presence of other heart-valve replacement: Secondary | ICD-10-CM

## 2010-11-17 DIAGNOSIS — I495 Sick sinus syndrome: Secondary | ICD-10-CM

## 2010-11-17 DIAGNOSIS — I359 Nonrheumatic aortic valve disorder, unspecified: Secondary | ICD-10-CM

## 2010-11-17 DIAGNOSIS — Z8679 Personal history of other diseases of the circulatory system: Secondary | ICD-10-CM

## 2010-11-20 ENCOUNTER — Encounter: Payer: Medicare Other | Admitting: *Deleted

## 2010-12-08 ENCOUNTER — Encounter: Payer: Medicare Other | Admitting: *Deleted

## 2010-12-15 ENCOUNTER — Ambulatory Visit (INDEPENDENT_AMBULATORY_CARE_PROVIDER_SITE_OTHER): Payer: Medicare Other | Admitting: *Deleted

## 2010-12-15 DIAGNOSIS — Z8679 Personal history of other diseases of the circulatory system: Secondary | ICD-10-CM

## 2010-12-15 DIAGNOSIS — I359 Nonrheumatic aortic valve disorder, unspecified: Secondary | ICD-10-CM

## 2010-12-15 DIAGNOSIS — Z954 Presence of other heart-valve replacement: Secondary | ICD-10-CM

## 2010-12-15 DIAGNOSIS — I4891 Unspecified atrial fibrillation: Secondary | ICD-10-CM

## 2010-12-15 LAB — POCT INR: INR: 2.4

## 2010-12-16 ENCOUNTER — Telehealth: Payer: Self-pay | Admitting: *Deleted

## 2010-12-16 NOTE — Telephone Encounter (Signed)
Received call from pt's daughter wanting to know if they could wait until Dr Artist Pais returns to schedule pt's follow up.  Gertie Gowda that they should proceed with f/u as planned on 01/01/11 and see Dr Rodena Medin as Dr Artist Pais is on medical leave. Provided her with number to imaging to arrange u/s to assess resolution of gallstones 1 week before 01/01/11 appt. Annice Pih voices understanding.

## 2010-12-19 ENCOUNTER — Other Ambulatory Visit: Payer: Self-pay | Admitting: Internal Medicine

## 2010-12-19 DIAGNOSIS — K8020 Calculus of gallbladder without cholecystitis without obstruction: Secondary | ICD-10-CM

## 2010-12-22 ENCOUNTER — Ambulatory Visit (HOSPITAL_BASED_OUTPATIENT_CLINIC_OR_DEPARTMENT_OTHER)
Admission: RE | Admit: 2010-12-22 | Discharge: 2010-12-22 | Disposition: A | Discharge status: Home / Self Care | Payer: Medicare Other | Source: Ambulatory Visit | Attending: Internal Medicine | Admitting: Internal Medicine

## 2010-12-22 DIAGNOSIS — K8020 Calculus of gallbladder without cholecystitis without obstruction: Secondary | ICD-10-CM

## 2011-01-01 ENCOUNTER — Encounter: Payer: Self-pay | Admitting: Internal Medicine

## 2011-01-01 ENCOUNTER — Ambulatory Visit: Payer: Medicare Other | Admitting: Internal Medicine

## 2011-01-01 ENCOUNTER — Ambulatory Visit (INDEPENDENT_AMBULATORY_CARE_PROVIDER_SITE_OTHER): Payer: Medicare Other | Admitting: Internal Medicine

## 2011-01-01 DIAGNOSIS — R1310 Dysphagia, unspecified: Secondary | ICD-10-CM

## 2011-01-01 DIAGNOSIS — K8020 Calculus of gallbladder without cholecystitis without obstruction: Secondary | ICD-10-CM

## 2011-01-01 NOTE — Patient Instructions (Signed)
Please schedule cbc, chem7, digoxin level (v58.69)  and lipid/lft (272.4) prior to next visit

## 2011-01-03 DIAGNOSIS — R1310 Dysphagia, unspecified: Secondary | ICD-10-CM | POA: Insufficient documentation

## 2011-01-03 NOTE — Progress Notes (Signed)
  Subjective:    Patient ID: [name], male    DOB: 08/13/1927, 77 y.o.   MRN: [id number]  HPI patient presents to clinic for followup of multiple medical problems. Has history of known gallstones with previous intermittent right upper quadrant pain. Is felt not to be an appropriate surgical candidate per patient. Attempted Actigall without adverse effect. Reviewed recent ultrasound demonstrating continued medication of gallstones. Denies abdominal pain, nausea or vomiting. Does complain of intermittent dysphagia without GERD symptoms. Has not recently been taking Prilosec. Maintained on Coumadin followed by the Coumadin clinic for history of atrophic fibrillation and denies gross active bleeding. Denies palpitations. No other complaints.  Reviewed past medical history, medications, and allergies  Review of Systems see hpi     Objective:   Physical Exam  Nursing note and vitals reviewed. Constitutional: He appears well-developed and well-nourished. No distress.  HENT:  Head: Normocephalic and atraumatic.  Right Ear: External ear normal.  Left Ear: External ear normal.  Eyes: Conjunctivae are normal. No scleral icterus.  Neck: Neck supple. No JVD present.  Cardiovascular: Normal rate, regular rhythm, normal heart sounds and intact distal pulses.  Exam reveals no gallop and no friction rub.   No murmur heard. Pulmonary/Chest: Effort normal and breath sounds normal. No respiratory distress. He has no wheezes. He has no rales. He exhibits no tenderness.  Abdominal: Soft. He exhibits no distension. There is no tenderness.  Skin: Skin is warm and dry. He is not diaphoretic.  Psychiatric: He has a normal mood and affect.          Assessment & Plan:   No problem-specific assessment & plan notes found for this encounter.

## 2011-01-03 NOTE — Assessment & Plan Note (Signed)
Asymptomatic. Stop Actigall. Not felt to be surgical candidate.

## 2011-01-03 NOTE — Assessment & Plan Note (Signed)
Resume PPI.Followup if no improvement or worsening. 

## 2011-01-05 ENCOUNTER — Ambulatory Visit (INDEPENDENT_AMBULATORY_CARE_PROVIDER_SITE_OTHER): Payer: Medicare Other | Admitting: *Deleted

## 2011-01-05 DIAGNOSIS — Z954 Presence of other heart-valve replacement: Secondary | ICD-10-CM

## 2011-01-05 DIAGNOSIS — I359 Nonrheumatic aortic valve disorder, unspecified: Secondary | ICD-10-CM

## 2011-01-05 DIAGNOSIS — Z8679 Personal history of other diseases of the circulatory system: Secondary | ICD-10-CM

## 2011-01-05 DIAGNOSIS — I4891 Unspecified atrial fibrillation: Secondary | ICD-10-CM

## 2011-01-05 LAB — POCT INR: INR: 3.4

## 2011-01-23 ENCOUNTER — Other Ambulatory Visit: Payer: Self-pay | Admitting: Cardiology

## 2011-02-02 ENCOUNTER — Ambulatory Visit (INDEPENDENT_AMBULATORY_CARE_PROVIDER_SITE_OTHER): Payer: Medicare Other | Admitting: *Deleted

## 2011-02-02 DIAGNOSIS — Z8679 Personal history of other diseases of the circulatory system: Secondary | ICD-10-CM

## 2011-02-02 DIAGNOSIS — I359 Nonrheumatic aortic valve disorder, unspecified: Secondary | ICD-10-CM

## 2011-02-02 DIAGNOSIS — Z954 Presence of other heart-valve replacement: Secondary | ICD-10-CM

## 2011-02-02 DIAGNOSIS — I4891 Unspecified atrial fibrillation: Secondary | ICD-10-CM

## 2011-02-02 LAB — POCT INR: INR: 2.7

## 2011-02-10 ENCOUNTER — Other Ambulatory Visit: Payer: Self-pay | Admitting: Cardiology

## 2011-02-10 ENCOUNTER — Telehealth: Payer: Self-pay | Admitting: Internal Medicine

## 2011-02-10 NOTE — Telephone Encounter (Signed)
Ok #30 rf 1 

## 2011-02-10 NOTE — Telephone Encounter (Signed)
Refill- lorazapam 1mg  tab acta. Take one half(1/2) tablet once daily as needed. Qty 30. Last fill 7.9.12

## 2011-02-11 MED ORDER — LORAZEPAM 1 MG PO TABS: ORAL_TABLET | ORAL | Status: DC

## 2011-02-11 NOTE — Telephone Encounter (Signed)
Call placed to [facility] [phone], Verbal refill provided to [facility].

## 2011-02-16 ENCOUNTER — Encounter: Payer: Self-pay | Admitting: Internal Medicine

## 2011-02-16 DIAGNOSIS — I495 Sick sinus syndrome: Secondary | ICD-10-CM

## 2011-03-02 ENCOUNTER — Ambulatory Visit (INDEPENDENT_AMBULATORY_CARE_PROVIDER_SITE_OTHER): Payer: Medicare Other | Admitting: *Deleted

## 2011-03-02 DIAGNOSIS — Z954 Presence of other heart-valve replacement: Secondary | ICD-10-CM

## 2011-03-02 DIAGNOSIS — Z8679 Personal history of other diseases of the circulatory system: Secondary | ICD-10-CM

## 2011-03-02 DIAGNOSIS — I4891 Unspecified atrial fibrillation: Secondary | ICD-10-CM

## 2011-03-02 DIAGNOSIS — I359 Nonrheumatic aortic valve disorder, unspecified: Secondary | ICD-10-CM

## 2011-03-02 LAB — POCT INR: INR: 3.2

## 2011-03-09 ENCOUNTER — Other Ambulatory Visit: Payer: Self-pay | Admitting: Cardiology

## 2011-03-11 LAB — FECAL LACTOFERRIN, QUANT

## 2011-03-11 LAB — HEPATIC FUNCTION PANEL
ALT: 33
Alkaline Phosphatase: 65
Bilirubin, Direct: 0.3
Indirect Bilirubin: 1.2 — ABNORMAL HIGH
Total Protein: 5.8 — ABNORMAL LOW

## 2011-03-11 LAB — BASIC METABOLIC PANEL
BUN: 12
BUN: 3 — ABNORMAL LOW
BUN: 4 — ABNORMAL LOW
CO2: 23
CO2: 24
CO2: 24
CO2: 24
CO2: 26
Calcium: 8.2 — ABNORMAL LOW
Chloride: 104
Chloride: 110
Chloride: 112
Chloride: 113 — ABNORMAL HIGH
Creatinine, Ser: 0.6
GFR calc Af Amer: >60
GFR calc Af Amer: >60
GFR calc non Af Amer: >60
Glucose, Bld: 111 — ABNORMAL HIGH
Glucose, Bld: 82
Glucose, Bld: 86
Glucose, Bld: 97
Potassium: 3 — ABNORMAL LOW
Potassium: 4
Sodium: 137
Sodium: 141
Sodium: 143

## 2011-03-11 LAB — CBC
HCT: 34.3 — ABNORMAL LOW
HCT: 35.3 — ABNORMAL LOW
HCT: 39.9
Hemoglobin: 12 — ABNORMAL LOW
Hemoglobin: 13.5
MCHC: 34
MCHC: 34
MCV: 100.1 — ABNORMAL HIGH
MCV: 100.5 — ABNORMAL HIGH
MCV: 100.7 — ABNORMAL HIGH
Platelets: 158
Platelets: 159
RBC: 3.51 — ABNORMAL LOW
RDW: 15
RDW: 15.2
RDW: 15.4
WBC: 4.2

## 2011-03-11 LAB — OVA AND PARASITE EXAMINATION

## 2011-03-11 LAB — URINALYSIS, ROUTINE W REFLEX MICROSCOPIC
Bilirubin Urine: NEGATIVE
Nitrite: NEGATIVE
Protein, ur: NEGATIVE
pH: 5.5

## 2011-03-11 LAB — DIFFERENTIAL
Basophils Absolute: 0
Eosinophils Absolute: 0
Eosinophils Relative: 0
Lymphocytes Relative: 12
Monocytes Absolute: 1.1 — ABNORMAL HIGH

## 2011-03-11 LAB — CLOSTRIDIUM DIFFICILE EIA

## 2011-03-11 LAB — TSH: TSH: 0.511

## 2011-03-11 LAB — PROTIME-INR: INR: 2.3 — ABNORMAL HIGH

## 2011-03-30 ENCOUNTER — Ambulatory Visit (INDEPENDENT_AMBULATORY_CARE_PROVIDER_SITE_OTHER): Payer: Medicare Other | Admitting: *Deleted

## 2011-03-30 DIAGNOSIS — Z954 Presence of other heart-valve replacement: Secondary | ICD-10-CM

## 2011-03-30 DIAGNOSIS — I359 Nonrheumatic aortic valve disorder, unspecified: Secondary | ICD-10-CM

## 2011-03-30 DIAGNOSIS — I4891 Unspecified atrial fibrillation: Secondary | ICD-10-CM

## 2011-03-30 DIAGNOSIS — Z8679 Personal history of other diseases of the circulatory system: Secondary | ICD-10-CM

## 2011-03-30 LAB — POCT INR: INR: 2.9

## 2011-04-02 ENCOUNTER — Encounter: Payer: Self-pay | Admitting: Internal Medicine

## 2011-04-02 ENCOUNTER — Ambulatory Visit (INDEPENDENT_AMBULATORY_CARE_PROVIDER_SITE_OTHER): Payer: Medicare Other | Admitting: Internal Medicine

## 2011-04-02 ENCOUNTER — Ambulatory Visit: Payer: Medicare Other | Admitting: Internal Medicine

## 2011-04-02 DIAGNOSIS — Z23 Encounter for immunization: Secondary | ICD-10-CM

## 2011-04-02 DIAGNOSIS — Z79899 Other long term (current) drug therapy: Secondary | ICD-10-CM

## 2011-04-02 DIAGNOSIS — R1310 Dysphagia, unspecified: Secondary | ICD-10-CM

## 2011-04-02 DIAGNOSIS — R351 Nocturia: Secondary | ICD-10-CM

## 2011-04-02 DIAGNOSIS — G4700 Insomnia, unspecified: Secondary | ICD-10-CM

## 2011-04-02 DIAGNOSIS — E785 Hyperlipidemia, unspecified: Secondary | ICD-10-CM

## 2011-04-02 LAB — HEPATIC FUNCTION PANEL
AST: 29 U/L (ref 0–37)
Albumin: 4 g/dL (ref 3.5–5.2)
Alkaline Phosphatase: 76 U/L (ref 39–117)
Total Protein: 6.3 g/dL (ref 6.0–8.3)

## 2011-04-02 LAB — BASIC METABOLIC PANEL
CO2: 24 mEq/L (ref 19–32)
Glucose, Bld: 111 mg/dL — ABNORMAL HIGH (ref 70–99)
Potassium: 4.3 mEq/L (ref 3.5–5.3)
Sodium: 139 mEq/L (ref 135–145)

## 2011-04-02 LAB — CBC
Hemoglobin: 12.8 g/dL — ABNORMAL LOW (ref 13.0–17.0)
RBC: 3.94 MIL/uL — ABNORMAL LOW (ref 4.22–5.81)

## 2011-04-02 MED ORDER — FINASTERIDE 5 MG PO TABS: 5.0000 mg | ORAL_TABLET | Freq: Every day | ORAL | Status: DC

## 2011-04-02 NOTE — Assessment & Plan Note (Signed)
Attempt to address underlying nocturia. However may cautiously increase ativan temporarily to 1mg  po qhs prn with family supervision.

## 2011-04-02 NOTE — Assessment & Plan Note (Signed)
Encouraged to take ppi daily and follow up if no resolution of sx's.

## 2011-04-02 NOTE — Progress Notes (Signed)
  Subjective:    Patient ID: [name], male    DOB: 11/01/1927, 77 y.o.   MRN: [id number]  HPI Pt presents to clinic for followup of multiple medical problems. Has known h/o gallstones and seems to have only rare mild associated pain. Not felt to be appropriate surgery candidate. Admits to poor sleep chronically and has nocturia 4+ times a night. Has difficulty falling back asleep after getting up. Has rare dysphagia and resumed PPI but is not taking daily. No other complaints.  Past Medical History  Diagnosis Date  . CAD (coronary artery disease)   . COPD (chronic obstructive pulmonary disease)   . C. difficile colitis     recurrent diarrhea  . Hyperlipidemia   . HTN (hypertension)   . Osteoarthritis   . BPH (benign prostatic hypertrophy)   . Low back pain   . Lumbar disc disease   . CHF (congestive heart failure)   . Permanent atrial fibrillation   . Colonic polyp   . Diverticulosis of colon   . Anxiety   . GERD (gastroesophageal reflux disease)   . PVD (peripheral vascular disease)   . TIA (transient ischemic attack)   . Diarrhea     recurrent diarrhea   Past Surgical History  Procedure Date  . Coronary artery bypass graft   . Pacemaker placement     permanent  . Abdominal aortic aneurysm repair     with intraluminal graft  . Aortic valve replacement     s/p   . Retinal detachment surgery     repair of right eye 9/211- Dr Luciana Axe    reports that he has quit smoking. He has never used smokeless tobacco. He reports that he does not drink alcohol. His drug history not on file. family history includes Cancer in his other; Hypertension in his mother; and Stroke in his father and sister. No Known Allergies   Review of Systems see hpi     Objective:   Physical Exam  Physical Exam  Nursing note and vitals reviewed. Constitutional: Appears well-developed and well-nourished. No distress.  HENT:  Head: Normocephalic and atraumatic.  Right Ear: External ear normal.    Left Ear: External ear normal.  Eyes: Conjunctivae are normal. No scleral icterus.  Neck: Neck supple. Carotid bruit is not present.  Cardiovascular: Normal rate, regular rhythm and normal heart sounds.  Exam reveals no gallop and no friction rub.   No murmur heard. Pulmonary/Chest: Effort normal and breath sounds normal. No respiratory distress. He has no wheezes. no rales.  Lymphadenopathy:    He has no cervical adenopathy.  Neurological:Alert.  Skin: Skin is warm and dry. Not diaphoretic.  Psychiatric: Has a normal mood and affect.        Assessment & Plan:

## 2011-04-02 NOTE — Assessment & Plan Note (Signed)
Suspected contribution from BPH. Increase proscar 5mg  po qd.

## 2011-04-03 ENCOUNTER — Telehealth: Payer: Self-pay | Admitting: *Deleted

## 2011-04-03 LAB — DIGOXIN LEVEL: Digoxin Level: 0.9 ng/mL (ref 0.8–2.0)

## 2011-04-03 NOTE — Telephone Encounter (Signed)
Patient daughter called back and left voice message stating patient is currently taking 5 mg of the finasteride, and was not sure if Dr Rodena Medin wanted him to increase the medication since he is already taking the 5 mg.

## 2011-04-03 NOTE — Telephone Encounter (Signed)
5mg  is the only dose that i know of. Sometimes used in combo with other prostate meds but they could make him dizzy and unsteady so don't recommend them. Could always get him to urology to see if they could help

## 2011-04-03 NOTE — Telephone Encounter (Signed)
Call placed to patients daughter Annice Pih at [phone], she was informed per Dr Rodena Medin instruction. She stated patient was referred to urology by [facility] about 3 years ago but, patient refuses to go back there because his wife died after being seen at the same location for 5 years, and  was diagnosed with bladder cancer that went undetected. Patient daughter Annice Pih stated the she will check with patient and call back next week with a decision.

## 2011-04-06 ENCOUNTER — Other Ambulatory Visit: Payer: Self-pay | Admitting: Cardiology

## 2011-04-27 ENCOUNTER — Telehealth: Payer: Self-pay | Admitting: Internal Medicine

## 2011-04-27 NOTE — Telephone Encounter (Signed)
Refill diovan 160 mg tablet take 1 tablet by mouth daily last fill 03-22-2012 qty 30

## 2011-04-28 MED ORDER — VALSARTAN 160 MG PO TABS: 160.0000 mg | ORAL_TABLET | Freq: Every day | ORAL | Status: DC

## 2011-04-28 NOTE — Telephone Encounter (Signed)
Refill sent to pharmacy.   

## 2011-05-04 ENCOUNTER — Other Ambulatory Visit: Payer: Self-pay | Admitting: Cardiology

## 2011-05-05 ENCOUNTER — Encounter: Payer: Self-pay | Admitting: *Deleted

## 2011-05-05 NOTE — Telephone Encounter (Signed)
This encounter was created in error - please disregard.

## 2011-05-11 ENCOUNTER — Ambulatory Visit (INDEPENDENT_AMBULATORY_CARE_PROVIDER_SITE_OTHER): Payer: Medicare Other | Admitting: *Deleted

## 2011-05-11 DIAGNOSIS — Z954 Presence of other heart-valve replacement: Secondary | ICD-10-CM

## 2011-05-11 DIAGNOSIS — Z8679 Personal history of other diseases of the circulatory system: Secondary | ICD-10-CM

## 2011-05-11 DIAGNOSIS — I359 Nonrheumatic aortic valve disorder, unspecified: Secondary | ICD-10-CM

## 2011-05-11 DIAGNOSIS — I4891 Unspecified atrial fibrillation: Secondary | ICD-10-CM

## 2011-05-11 LAB — POCT INR: INR: 3

## 2011-05-25 ENCOUNTER — Encounter: Payer: Self-pay | Admitting: Internal Medicine

## 2011-05-25 DIAGNOSIS — I495 Sick sinus syndrome: Secondary | ICD-10-CM

## 2011-06-15 ENCOUNTER — Telehealth: Payer: Self-pay | Admitting: *Deleted

## 2011-06-15 NOTE — Telephone Encounter (Signed)
Received voicemail from pt's daughter, Annice Pih requesting refill of pt's lorazepam to [facility] in [place]. Also wants to get handicap placard completed. Spoke to pt and advised him we would let him know once refill and placard have been completed. Handicap application forwarded to Provider for signature. Please advise re: # of refills.

## 2011-06-15 NOTE — Telephone Encounter (Signed)
rf2

## 2011-06-16 MED ORDER — LORAZEPAM 1 MG PO TABS: ORAL_TABLET | ORAL | Status: DC

## 2011-06-16 NOTE — Telephone Encounter (Signed)
Notified pt that handicap application has been signed and is at the front desk to be picked up.

## 2011-06-16 NOTE — Telephone Encounter (Signed)
Rx refill called to pharmacy physician line.

## 2011-06-22 ENCOUNTER — Ambulatory Visit (INDEPENDENT_AMBULATORY_CARE_PROVIDER_SITE_OTHER): Payer: Medicare Other | Admitting: *Deleted

## 2011-06-22 DIAGNOSIS — I359 Nonrheumatic aortic valve disorder, unspecified: Secondary | ICD-10-CM

## 2011-06-22 DIAGNOSIS — I4891 Unspecified atrial fibrillation: Secondary | ICD-10-CM

## 2011-06-22 DIAGNOSIS — Z8679 Personal history of other diseases of the circulatory system: Secondary | ICD-10-CM

## 2011-06-22 DIAGNOSIS — Z954 Presence of other heart-valve replacement: Secondary | ICD-10-CM

## 2011-07-13 ENCOUNTER — Telehealth: Payer: Self-pay | Admitting: Internal Medicine

## 2011-07-13 MED ORDER — SIMVASTATIN 20 MG PO TABS: 20.0000 mg | ORAL_TABLET | Freq: Every day | ORAL | Status: DC

## 2011-07-13 NOTE — Telephone Encounter (Signed)
Refill sent to pharmacy.   

## 2011-07-20 IMAGING — CR DG CHEST 2V
2 series · 2 of 2 positions shown · non-contrast
Comparison: Chest x-ray of 12/30/2009

CLINICAL DATA: Cough, cold, hypertension

CHEST - 2 VIEW

[w chest pa]
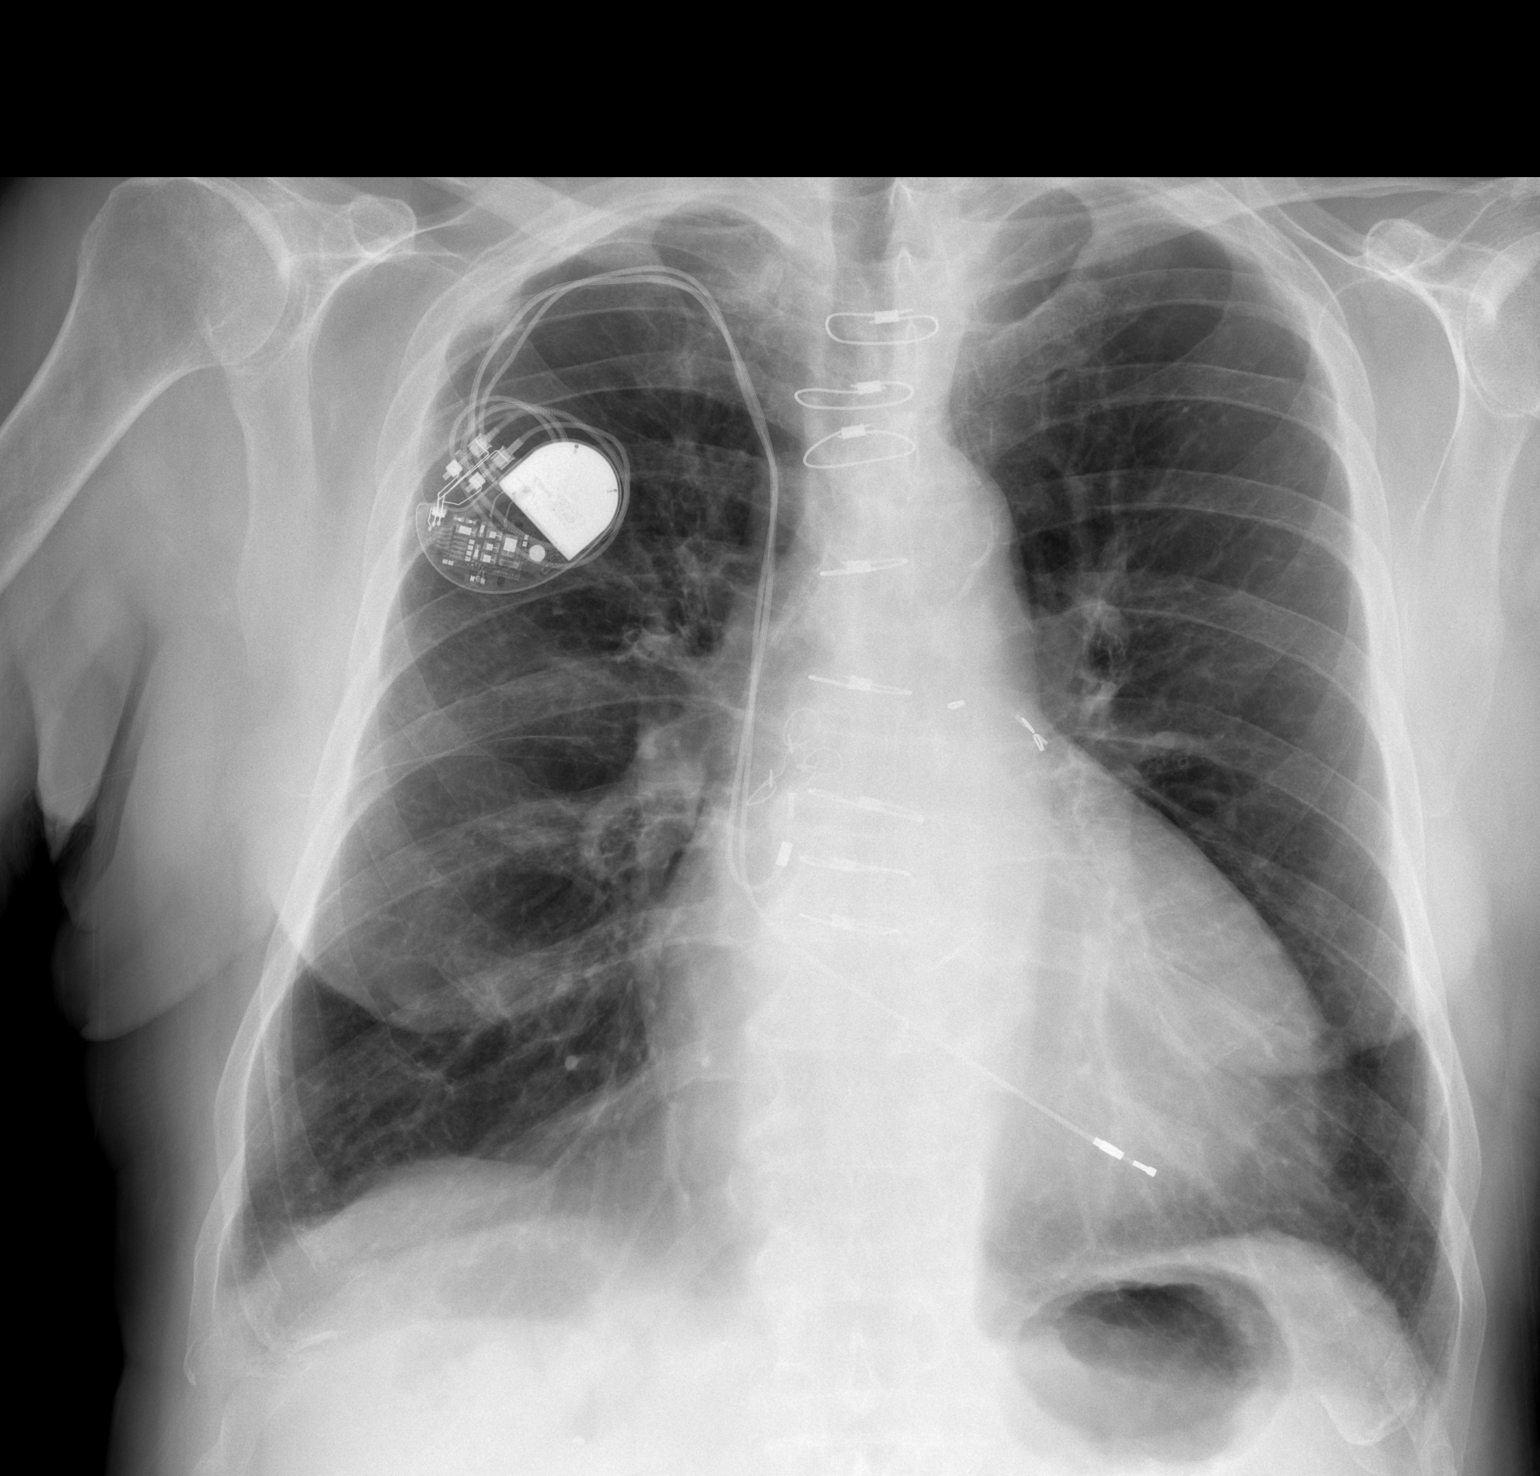

[w chest lat]
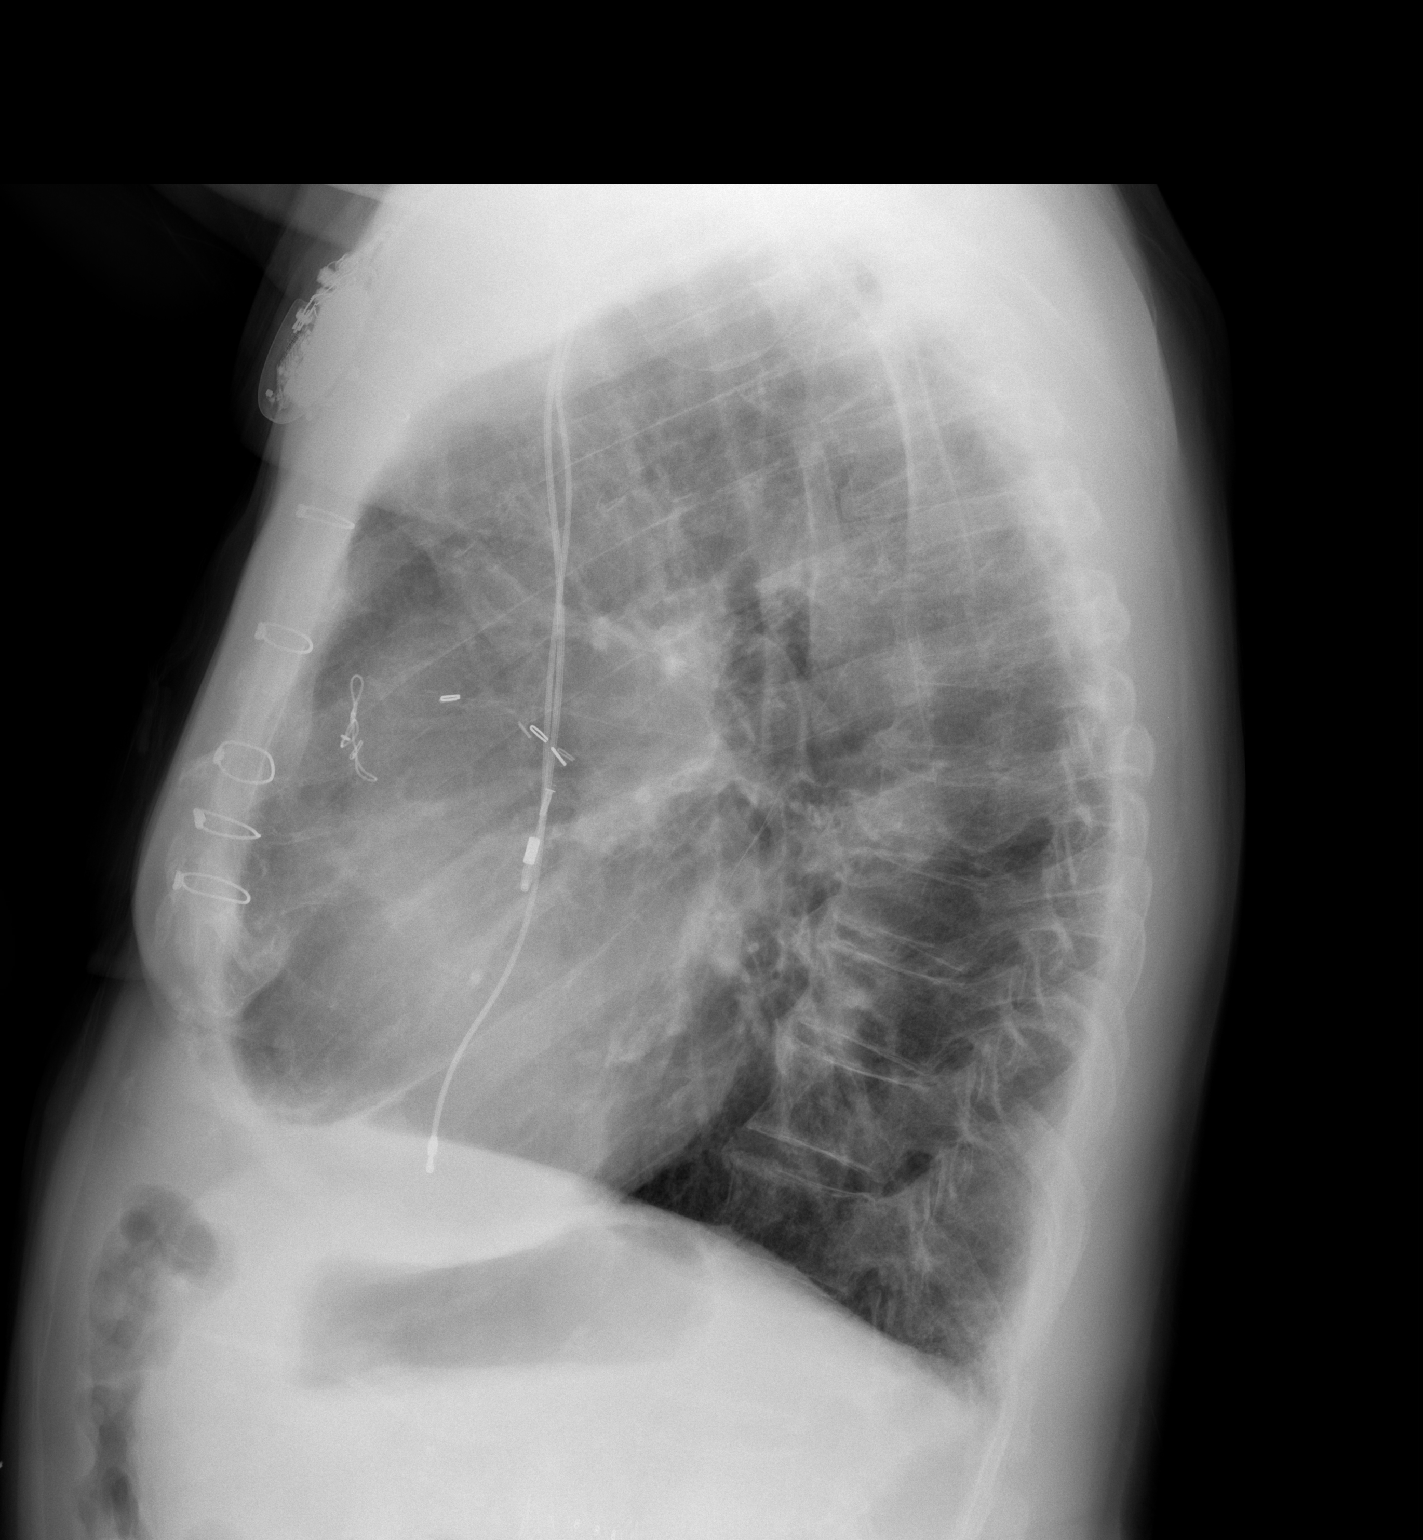

[2 of 2 positions shown; findings below may reference images not displayed]

FINDINGS: No active infiltrate or effusion is seen.  The  lungs
remain hyperaerated.  Mild peribronchial thickening is noted.
Cardiomegaly is stable.  Permanent pacemaker remains.  Median
sternotomy sutures are intact.  No acute bony abnormality is seen.
IMPRESSION: No pneumonia.  No change in hyperaeration.  Mild peribronchial
thickening.

## 2011-07-27 ENCOUNTER — Ambulatory Visit (INDEPENDENT_AMBULATORY_CARE_PROVIDER_SITE_OTHER): Payer: Medicare Other | Admitting: Internal Medicine

## 2011-07-27 ENCOUNTER — Encounter: Payer: Self-pay | Admitting: Internal Medicine

## 2011-07-27 DIAGNOSIS — M109 Gout, unspecified: Secondary | ICD-10-CM

## 2011-07-27 DIAGNOSIS — Z79899 Other long term (current) drug therapy: Secondary | ICD-10-CM

## 2011-07-27 DIAGNOSIS — Z23 Encounter for immunization: Secondary | ICD-10-CM

## 2011-07-27 DIAGNOSIS — R351 Nocturia: Secondary | ICD-10-CM

## 2011-07-27 DIAGNOSIS — N400 Enlarged prostate without lower urinary tract symptoms: Secondary | ICD-10-CM

## 2011-07-27 MED ORDER — COLCHICINE 0.6 MG PO TABS: ORAL_TABLET | ORAL | Status: DC

## 2011-07-27 NOTE — Assessment & Plan Note (Signed)
Attempt colchicine prn. Avoid nsaids. Obtain uric acid level

## 2011-07-27 NOTE — Progress Notes (Signed)
  Subjective:    Patient ID: [name], male    DOB: 04-Sep-1927, 78 y.o.   MRN: [id number]  HPI Pt presents to clinic for followup of multiple medical problems. Continues to note nocturia now 4-6 times a night. Taking proscar without improvement. Notes h/o gout with intermittent right wrist swelling and pain. Took allopurinol pill had at home. Sx's now resolved. No obvious trigger. No other complaints.  Past Medical History  Diagnosis Date  . CAD (coronary artery disease)   . COPD (chronic obstructive pulmonary disease)   . C. difficile colitis     recurrent diarrhea  . Hyperlipidemia   . HTN (hypertension)   . Osteoarthritis   . BPH (benign prostatic hypertrophy)   . Low back pain   . Lumbar disc disease   . CHF (congestive heart failure)   . Permanent atrial fibrillation   . Colonic polyp   . Diverticulosis of colon   . Anxiety   . GERD (gastroesophageal reflux disease)   . PVD (peripheral vascular disease)   . TIA (transient ischemic attack)   . Diarrhea     recurrent diarrhea   Past Surgical History  Procedure Date  . Coronary artery bypass graft   . Pacemaker placement     permanent  . Abdominal aortic aneurysm repair     with intraluminal graft  . Aortic valve replacement     s/p   . Retinal detachment surgery     repair of right eye 9/211- Dr Luciana Axe    reports that he has quit smoking. He has never used smokeless tobacco. He reports that he does not drink alcohol. His drug history not on file. family history includes Cancer in his other; Hypertension in his mother; and Stroke in his father and sister. No Known Allergies    Review of Systems see hpi     Objective:   Physical Exam  Nursing note and vitals reviewed. Constitutional: He appears well-developed and well-nourished. No distress.  HENT:  Head: Normocephalic and atraumatic.  Right Ear: External ear normal.  Left Ear: External ear normal.  Eyes: Conjunctivae are normal. No scleral icterus.    Neck: Neck supple. No JVD present.  Cardiovascular: Normal rate, regular rhythm and normal heart sounds.   Pulmonary/Chest: Effort normal and breath sounds normal. No respiratory distress. He has no wheezes. He has no rales.  Musculoskeletal:       Right wrist -FROM. No erythema or swelling  Neurological: He is alert.  Skin: Skin is warm and dry. He is not diaphoretic.  Psychiatric: He has a normal mood and affect.          Assessment & Plan:

## 2011-07-27 NOTE — Assessment & Plan Note (Signed)
Urology consult

## 2011-07-28 LAB — BASIC METABOLIC PANEL
Chloride: 106 mEq/L (ref 96–112)
Potassium: 4.9 mEq/L (ref 3.5–5.3)
Sodium: 142 mEq/L (ref 135–145)

## 2011-07-28 LAB — CBC WITH DIFFERENTIAL/PLATELET
Basophils Absolute: 0 10*3/uL (ref 0.0–0.1)
HCT: 40.8 % (ref 39.0–52.0)
Lymphocytes Relative: 20 % (ref 12–46)
Neutro Abs: 5.1 10*3/uL (ref 1.7–7.7)
Platelets: 198 10*3/uL (ref 150–400)
RDW: 14.1 % (ref 11.5–15.5)
WBC: 7.8 10*3/uL (ref 4.0–10.5)

## 2011-08-03 ENCOUNTER — Ambulatory Visit (INDEPENDENT_AMBULATORY_CARE_PROVIDER_SITE_OTHER): Payer: Medicare Other | Admitting: Pharmacist

## 2011-08-03 DIAGNOSIS — I359 Nonrheumatic aortic valve disorder, unspecified: Secondary | ICD-10-CM

## 2011-08-03 DIAGNOSIS — Z8679 Personal history of other diseases of the circulatory system: Secondary | ICD-10-CM

## 2011-08-03 DIAGNOSIS — Z954 Presence of other heart-valve replacement: Secondary | ICD-10-CM

## 2011-08-03 DIAGNOSIS — I4891 Unspecified atrial fibrillation: Secondary | ICD-10-CM

## 2011-08-03 LAB — POCT INR: INR: 3.2

## 2011-08-10 ENCOUNTER — Ambulatory Visit (INDEPENDENT_AMBULATORY_CARE_PROVIDER_SITE_OTHER): Payer: Medicare Other | Admitting: Internal Medicine

## 2011-08-10 ENCOUNTER — Encounter: Payer: Self-pay | Admitting: Internal Medicine

## 2011-08-10 ENCOUNTER — Telehealth: Payer: Self-pay | Admitting: Internal Medicine

## 2011-08-10 ENCOUNTER — Other Ambulatory Visit: Payer: Self-pay | Admitting: Cardiology

## 2011-08-10 DIAGNOSIS — R42 Dizziness and giddiness: Secondary | ICD-10-CM | POA: Insufficient documentation

## 2011-08-10 DIAGNOSIS — R0600 Dyspnea, unspecified: Secondary | ICD-10-CM | POA: Insufficient documentation

## 2011-08-10 DIAGNOSIS — R0609 Other forms of dyspnea: Secondary | ICD-10-CM

## 2011-08-10 NOTE — Telephone Encounter (Signed)
Pt to contact pcp per Norma Fredrickson.  Pt's daughter was notified.

## 2011-08-10 NOTE — Assessment & Plan Note (Signed)
ekg obtained demonstrates paced rhythm with rate of 60. Obtain bnp. Recommend ed evaluation if develops further episodes.

## 2011-08-10 NOTE — Telephone Encounter (Signed)
[name] states he had an episode this morning while making his bed a couple hours ago.  He became unsteady, nauseated, sweating, dry heaves and felt like his face was burning up.  This lasted about 7 minutes.  He did not pass out.  No pain.  No sob.  Now, symptoms are gone, while sitting.  When standing, he still feels wobbly.  No BP cuff so he doesn't know his bp.  He isn't sure if he had a rapid or irregular heart rate.  He does have a pacemaker and gall stones.

## 2011-08-10 NOTE — Assessment & Plan Note (Signed)
Neurologically nonfocal. Recent inr therapeutic. Obtain lanoxin level

## 2011-08-10 NOTE — Telephone Encounter (Signed)
Pt's dtr calling re pt had episode about 1030am nausea, sweating, face hot, felt weak, denies SOB and chest pain , now just feels weak, pls advise

## 2011-08-10 NOTE — Progress Notes (Signed)
  Subjective:    Patient ID: [name], male    DOB: 1927-09-27, 78 y.o.   MRN: [id number]  HPI Pt presents to clinic for evaluation of dizziness. Was in his usual state of health until this am while making the bed developed episode of dizziness, facial flushing, nausea and sweating. Duration was ~5-10 minutes followed by spontaneous resolution. Denies having been dyspneic at the time but his daughter noted he sounded short of breath on the phone. After sx's resolved his legs felt generally weak but without focal weakness. Specifically denies chest pain/pressure, syncope, palpitations or focal neurologic deficit. Recent cbc, chem7 reviewed nl.   Past Medical History  Diagnosis Date  . CAD (coronary artery disease)   . COPD (chronic obstructive pulmonary disease)   . C. difficile colitis     recurrent diarrhea  . Hyperlipidemia   . HTN (hypertension)   . Osteoarthritis   . BPH (benign prostatic hypertrophy)   . Low back pain   . Lumbar disc disease   . CHF (congestive heart failure)   . Permanent atrial fibrillation   . Colonic polyp   . Diverticulosis of colon   . Anxiety   . GERD (gastroesophageal reflux disease)   . PVD (peripheral vascular disease)   . TIA (transient ischemic attack)   . Diarrhea     recurrent diarrhea   Past Surgical History  Procedure Date  . Coronary artery bypass graft   . Pacemaker placement     permanent  . Abdominal aortic aneurysm repair     with intraluminal graft  . Aortic valve replacement     s/p   . Retinal detachment surgery     repair of right eye 9/211- Dr Luciana Axe    reports that he has quit smoking. He has never used smokeless tobacco. He reports that he does not drink alcohol. His drug history not on file. family history includes Cancer in his other; Hypertension in his mother; and Stroke in his father and sister. No Known Allergies   Review of Systems see hpi     Objective:   Physical Exam  Nursing note and vitals  reviewed. Constitutional: He appears well-developed and well-nourished. No distress.  HENT:  Head: Normocephalic and atraumatic.  Mouth/Throat: Oropharynx is clear and moist. No oropharyngeal exudate.  Eyes: Conjunctivae and EOM are normal. Pupils are equal, round, and reactive to light. No scleral icterus.  Neck: Neck supple. No JVD present.  Cardiovascular: Normal rate, regular rhythm and normal heart sounds.        +valve click  Pulmonary/Chest: Breath sounds normal. No respiratory distress. He has no wheezes. He has no rales.  Neurological: He is alert. No cranial nerve deficit. Coordination normal.  Skin: Skin is warm and dry. He is not diaphoretic.  Psychiatric: He has a normal mood and affect.          Assessment & Plan:

## 2011-08-11 LAB — DIGOXIN LEVEL: Digoxin Level: 1.1 ng/mL (ref 0.8–2.0)

## 2011-08-24 ENCOUNTER — Other Ambulatory Visit: Payer: Self-pay | Admitting: Cardiology

## 2011-08-24 ENCOUNTER — Other Ambulatory Visit: Payer: Self-pay | Admitting: Internal Medicine

## 2011-08-24 NOTE — Telephone Encounter (Signed)
Rx refill sent to pharmacy. 

## 2011-09-09 ENCOUNTER — Ambulatory Visit: Payer: Medicare Other | Admitting: Vascular Surgery

## 2011-09-10 ENCOUNTER — Other Ambulatory Visit: Payer: Self-pay | Admitting: Cardiology

## 2011-09-14 ENCOUNTER — Ambulatory Visit (INDEPENDENT_AMBULATORY_CARE_PROVIDER_SITE_OTHER): Payer: Medicare Other | Admitting: *Deleted

## 2011-09-14 DIAGNOSIS — I359 Nonrheumatic aortic valve disorder, unspecified: Secondary | ICD-10-CM

## 2011-09-14 DIAGNOSIS — I4891 Unspecified atrial fibrillation: Secondary | ICD-10-CM

## 2011-09-14 DIAGNOSIS — Z8679 Personal history of other diseases of the circulatory system: Secondary | ICD-10-CM

## 2011-09-14 DIAGNOSIS — Z954 Presence of other heart-valve replacement: Secondary | ICD-10-CM

## 2011-09-29 ENCOUNTER — Telehealth: Payer: Self-pay | Admitting: Internal Medicine

## 2011-09-29 ENCOUNTER — Ambulatory Visit (INDEPENDENT_AMBULATORY_CARE_PROVIDER_SITE_OTHER): Payer: Medicare Other | Admitting: Internal Medicine

## 2011-09-29 ENCOUNTER — Encounter: Payer: Self-pay | Admitting: Internal Medicine

## 2011-09-29 VITALS — BP 120/52 | HR 60 | Ht 67.0 in | Wt 189.0 lb

## 2011-09-29 DIAGNOSIS — I495 Sick sinus syndrome: Secondary | ICD-10-CM

## 2011-09-29 DIAGNOSIS — I10 Essential (primary) hypertension: Secondary | ICD-10-CM

## 2011-09-29 DIAGNOSIS — I4891 Unspecified atrial fibrillation: Secondary | ICD-10-CM

## 2011-09-29 DIAGNOSIS — Z950 Presence of cardiac pacemaker: Secondary | ICD-10-CM

## 2011-09-29 LAB — PACEMAKER DEVICE OBSERVATION
AL AMPLITUDE: 1.1 mv
ATRIAL PACING PM: 1
BATTERY VOLTAGE: 2.78 V
BRDY-0002RV: 60 {beats}/min
BRDY-0004RV: 115 {beats}/min
DEVICE MODEL PM: 1577438
VENTRICULAR PACING PM: 74

## 2011-09-29 MED ORDER — AMLODIPINE BESYLATE 5 MG PO TABS: 5.0000 mg | ORAL_TABLET | Freq: Every day | ORAL | Status: DC

## 2011-09-29 NOTE — Patient Instructions (Signed)
Your physician wants you to follow-up in: 12 months with Dr. Taylor. You will receive a reminder letter in the mail two months in advance. If you don't receive a letter, please call our office to schedule the follow-up appointment.    

## 2011-09-29 NOTE — Telephone Encounter (Signed)
Rx refill sent to pharmacy. 

## 2011-09-29 NOTE — Assessment & Plan Note (Signed)
He is maintaining atrial fibrillation but his ventricular rate is well controlled. We'll follow

## 2011-09-29 NOTE — Progress Notes (Signed)
HPI [name] returns today for followup. He is a very pleasant 78 year old man with a history of hypertension, bradycardia, status post prior pacemaker insertion, and dyslipidemia. In the interim he has been stable. He does note some mild fatigue and weakness. He has mentioned in switching medications secondary to cost and other issues. He denies chest pain. His minimal dyspnea with exertion. He denies edema. No syncope. No Known Allergies   Current Outpatient Prescriptions  Medication Sig Dispense Refill  . amLODipine (NORVASC) 5 MG tablet Take 1 tablet (5 mg total) by mouth daily.  90 tablet  1  . carvedilol (COREG) 25 MG tablet TAKE ONE (1) TABLET TWICE DAILY  60 tablet  0  . colchicine 0.6 MG tablet One po bid prn gout  30 tablet  2  . COUMADIN 5 MG tablet USE AS DIRECTED BY ANTICOAG [facility]ATION CLINIC  40 tablet  3  . DIOVAN 160 MG tablet TAKE ONE TABLET BY MOUTH ONE TIME DAILY  30 tablet  6  . finasteride (PROSCAR) 5 MG tablet Take 1 tablet (5 mg total) by mouth daily.  30 tablet  6  . LANOXIN 0.125 MG tablet TAKE ONE (1) TABLET ONCE DAILY  30 tablet  12  . LORazepam (ATIVAN) 1 MG tablet Take 1/2 tablet by mouth once a day as needed  30 tablet  2  . nystatin (MYCOSTATIN) ointment Apply topically 2 (two) times daily. Apply twice daily to buttocks/rash until healed       . omeprazole (PRILOSEC) 20 MG capsule Take 20 mg by mouth daily. For reflux       . Silodosin (RAPAFLO PO) Take 1 tablet by mouth daily. To help with urinary flow      . simvastatin (ZOCOR) 20 MG tablet Take 1 tablet (20 mg total) by mouth at bedtime.  90 tablet  1  . DISCONTD: amLODipine (NORVASC) 5 MG tablet TAKE ONE (1) TABLET(S) ONCE DAILY  30 tablet  0     Past Medical History  Diagnosis Date  . CAD (coronary artery disease)   . COPD (chronic obstructive pulmonary disease)   . C. difficile colitis     recurrent diarrhea  . Hyperlipidemia   . HTN (hypertension)   . Osteoarthritis   . BPH (benign prostatic  hypertrophy)   . Low back pain   . Lumbar disc disease   . CHF (congestive heart failure)   . Permanent atrial fibrillation   . Colonic polyp   . Diverticulosis of colon   . Anxiety   . GERD (gastroesophageal reflux disease)   . PVD (peripheral vascular disease)   . TIA (transient ischemic attack)   . Diarrhea     recurrent diarrhea  . Tachy-brady syndrome   . Abdominal aneurysm     ROS:   All systems reviewed and negative except as noted in the HPI.   Past Surgical History  Procedure Date  . Coronary artery bypass graft 1993  . Pacemaker placement     permanent  . Abdominal aortic aneurysm repair     with intraluminal graft  . Aortic valve replacement     s/p   . Retinal detachment surgery     repair of right eye 9/211- Dr Luciana Axe     Family History  Problem Relation Age of Onset  . Hypertension Mother   . Stroke Father     died of stroke  . Stroke Sister     died of stroke  . Cancer Other  wife diagnosed with advanced bladder cancer-deceased     History   Social History  . Marital Status: Widowed    Spouse Name: N/A    Number of Children: N/A  . Years of Education: N/A   Occupational History  . Not on file.   Social History Main Topics  . Smoking status: Former Games developer  . Smokeless tobacco: Never Used  . Alcohol Use: No  . Drug Use: Not on file  . Sexually Active: Not on file   Other Topics Concern  . Not on file   Social History Narrative   Former Smoker  Alcohol use-noWidowed              BP 120/52  Pulse 60  Ht 5\' 7"  (1.702 m)  Wt 189 lb (85.73 kg)  BMI 29.60 kg/m2  Physical Exam:  Well appearing elderly man, NAD HEENT: Unremarkable Neck:  No JVD, no thyromegally Lymphatics:  No adenopathy Back:  No CVA tenderness Lungs:  Clear with no wheezes, rales, or rhonchi. HEART:  Regular rate rhythm, no murmurs, no rubs, no clicks Abd:  soft, positive bowel sounds, no organomegally, no rebound, no guarding Ext:  2 plus pulses, no  edema, no cyanosis, no clubbing Skin:  No rashes no nodules Neuro:  CN II through XII intact, motor grossly intact  DEVICE  Normal device function.  See PaceArt for details.   Assess/Plan:

## 2011-09-29 NOTE — Assessment & Plan Note (Signed)
His device is working normally. His ventricular rate appears to be well controlled in atrial fibrillation. We'll recheck in several months.

## 2011-09-29 NOTE — Assessment & Plan Note (Signed)
His blood pressure today is well controlled. He is doing well. He will continue low-sodium diet and his current medical therapy. I have deferred the decision to adjust his blood pressure medications to his primary physician. The patient has wondered whether he could stop amlodipine and whether or not he could switch his Diovan to a generic equivalent.

## 2011-09-29 NOTE — Telephone Encounter (Signed)
Refill- amlodipine besylate oral tablet 5mg . Take one tablet once daily by mouth. Qty 90 last fill 1.28.13

## 2011-10-05 ENCOUNTER — Other Ambulatory Visit: Payer: Self-pay | Admitting: Cardiology

## 2011-10-12 ENCOUNTER — Ambulatory Visit (INDEPENDENT_AMBULATORY_CARE_PROVIDER_SITE_OTHER): Payer: Medicare Other | Admitting: *Deleted

## 2011-10-12 DIAGNOSIS — I4891 Unspecified atrial fibrillation: Secondary | ICD-10-CM

## 2011-10-12 DIAGNOSIS — I359 Nonrheumatic aortic valve disorder, unspecified: Secondary | ICD-10-CM

## 2011-10-12 DIAGNOSIS — Z954 Presence of other heart-valve replacement: Secondary | ICD-10-CM

## 2011-10-12 DIAGNOSIS — Z8679 Personal history of other diseases of the circulatory system: Secondary | ICD-10-CM

## 2011-10-12 LAB — POCT INR: INR: 3.3

## 2011-10-13 ENCOUNTER — Encounter: Payer: Self-pay | Admitting: Neurosurgery

## 2011-10-14 ENCOUNTER — Ambulatory Visit (INDEPENDENT_AMBULATORY_CARE_PROVIDER_SITE_OTHER): Payer: Medicare Other | Admitting: Neurosurgery

## 2011-10-14 ENCOUNTER — Encounter: Payer: Self-pay | Admitting: Neurosurgery

## 2011-10-14 ENCOUNTER — Ambulatory Visit (INDEPENDENT_AMBULATORY_CARE_PROVIDER_SITE_OTHER): Payer: Medicare Other | Admitting: *Deleted

## 2011-10-14 VITALS — BP 123/62 | HR 60 | Resp 16 | Ht 68.0 in | Wt 190.0 lb

## 2011-10-14 DIAGNOSIS — I714 Abdominal aortic aneurysm, without rupture: Secondary | ICD-10-CM

## 2011-10-14 DIAGNOSIS — Z48812 Encounter for surgical aftercare following surgery on the circulatory system: Secondary | ICD-10-CM

## 2011-10-14 NOTE — Progress Notes (Signed)
[facility] [place] HISTORY AND PHYSICAL   CC: Annual AAA duplex status post stent repair March 2001 Referring Physician: Edilia Bo  History of Present Illness: This is 78 year old male patient of Dr. Adele Dan underwent a AAA stent repair in March 2001 Dr. Madilyn Fireman. He's been followed annually since that time. Next The patient reports no pain in his abdomen or back. He is quite feeble and hard of hearing. Patient seen with his daughter today they do not report any new medical problems or any recent surgeries.  Past Medical History  Diagnosis Date  . CAD (coronary artery disease)   . COPD (chronic obstructive pulmonary disease)   . C. difficile colitis     recurrent diarrhea  . Hyperlipidemia   . HTN (hypertension)   . Osteoarthritis   . BPH (benign prostatic hypertrophy)   . Low back pain   . Lumbar disc disease   . CHF (congestive heart failure)   . Permanent atrial fibrillation   . Colonic polyp   . Diverticulosis of colon   . Anxiety   . GERD (gastroesophageal reflux disease)   . PVD (peripheral vascular disease)   . TIA (transient ischemic attack)   . Diarrhea     recurrent diarrhea  . Tachy-brady syndrome   . Abdominal aneurysm     ROS: [x]  Positive   [ ]  Denies    General: [ ]  Weight loss, [ ]  Fever, [ ]  chills Neurologic: [ ]  Dizziness, [ ]  Blackouts, [ ]  Seizure [ ]  Stroke, [ ]  "Mini stroke", [ ]  Slurred speech, [ ]  Temporary blindness; [ ]  weakness in arms or legs, [ ]  Hoarseness Cardiac: [ ]  Chest pain/pressure, [ ]  Shortness of breath at rest [ ]  Shortness of breath with exertion, [ ]  Atrial fibrillation or irregular heartbeat Vascular: [ ]  Pain in legs with walking, [ ]  Pain in legs at rest, [ ]  Pain in legs at night,  [ ]  Non-healing ulcer, [ ]  Blood clot in vein/DVT,   Pulmonary: [ ]  Home oxygen, [ ]  Productive cough, [ ]  Coughing up blood, [ ]  Asthma,  [ ]  Wheezing Musculoskeletal:  [ ]  Arthritis, [ ]  Low back pain, [ ]  Joint  pain Hematologic: [ ]  Easy Bruising, [ ]  Anemia; [ ]  Hepatitis Gastrointestinal: [ ]  Blood in stool, [ ]  Gastroesophageal Reflux/heartburn, [ ]  Trouble swallowing Urinary: [ ]  chronic Kidney disease, [ ]  on HD - [ ]  MWF or [ ]  TTHS, [ ]  Burning with urination, [ ]  Difficulty urinating Skin: [ ]  Rashes, [ ]  Wounds Psychological: [ ]  Anxiety, [ ]  Depression   Social History History  Substance Use Topics  . Smoking status: Former Games developer  . Smokeless tobacco: Never Used  . Alcohol Use: No    Family History Family History  Problem Relation Age of Onset  . Hypertension Mother   . Stroke Father     died of stroke  . Stroke Sister     died of stroke  . Cancer Other     wife diagnosed with advanced bladder cancer-deceased    No Known Allergies  Current Outpatient Prescriptions  Medication Sig Dispense Refill  . amLODipine (NORVASC) 5 MG tablet Take 1 tablet (5 mg total) by mouth daily.  90 tablet  1  . carvedilol (COREG) 25 MG tablet TAKE ONE TABLET BY MOUTH TWICE DAILY  60 tablet  12  . colchicine 0.6 MG tablet One po bid prn gout  30 tablet  2  .  COUMADIN 5 MG tablet USE AS DIRECTED BY [facility]  40 tablet  3  . DIOVAN 160 MG tablet TAKE ONE TABLET BY MOUTH ONE TIME DAILY  30 tablet  6  . finasteride (PROSCAR) 5 MG tablet Take 1 tablet (5 mg total) by mouth daily.  30 tablet  6  . LANOXIN 0.125 MG tablet TAKE ONE (1) TABLET ONCE DAILY  30 tablet  12  . LORazepam (ATIVAN) 1 MG tablet Take 1/2 tablet by mouth once a day as needed  30 tablet  2  . nystatin (MYCOSTATIN) ointment Apply topically 2 (two) times daily. Apply twice daily to buttocks/rash until healed       . omeprazole (PRILOSEC) 20 MG capsule Take 20 mg by mouth daily. For reflux       . Silodosin (RAPAFLO PO) Take 1 tablet by mouth daily. To help with urinary flow      . simvastatin (ZOCOR) 20 MG tablet Take 1 tablet (20 mg total) by mouth at bedtime.  90 tablet  1    Physical Examination  Filed  Vitals:   10/14/11 1034  BP: 123/62  Pulse: 60  Resp: 16    Body mass index is 28.89 kg/(m^2).  General:  WDWN in NAD Gait: Normal HEENT: WNL Eyes: Pupils equal Pulmonary: normal non-labored breathing , without Rales, rhonchi,  wheezing Cardiac: RRR, without  Murmurs, rubs or gallops; No carotid bruits Abdomen: soft, NT, no masses Skin: no rashes, ulcers noted Vascular Exam/Pulses: Patient has palpable femoral pulses bilaterally there is no abdominal mass palpated, lower extremity pulses are 1+ bilaterally  Extremities without ischemic changes, no Gangrene , no cellulitis; no open wounds;  Musculoskeletal: no muscle wasting or atrophy  Neurologic: A&O X 3; Appropriate Affect ; SENSATION: normal; MOTOR FUNCTION:  moving all extremities equally. Speech is fluent/normal  Non-Invasive Vascular Imaging: AAA duplex today shows a measurement of 4.6 AP, 3.9 transverse, which is virtually unchanged from one year ago.  ASSESSMENT/PLAN: Patient 12 years status post stent repair with Dr. Madilyn Fireman, the patient will followup here in one year with a repeat AAA duplex he is in agreement with this, his questions were encouraged and answered.  Lauree Chandler ANP  M.D.: Edilia Bo

## 2011-10-15 NOTE — Progress Notes (Signed)
Addended by: Lorin Mercy K on: 10/15/2011 11:11 AM   Modules accepted: Orders

## 2011-10-15 NOTE — Procedures (Unsigned)
DUPLEX ULTRASOUND OF ABDOMINAL AORTA  INDICATION:  Status post EVAR.  HISTORY: Diabetes:  No. Cardiac:  No. Hypertension:  Yes. Smoking:  Previously. Connective Tissue Disorder: Family History: Previous Surgery:  Abdominal aortic aneurysm stent repair 08/12/1999 by Dr. Madilyn Fireman.  DUPLEX EXAM:         AP (cm)                   TRANSVERSE (cm) Proximal             2.1 cm                    2.3 cm Mid                  2.6 cm                    2.7 cm Distal               4.6 cm                    3.9 cm Right Iliac          Not visualized            Not visualized Left Iliac           Not visualized            Not visualized  PREVIOUS:  Date: 09/03/2010  AP:  4.4  TRANSVERSE:  3.9  IMPRESSION: 1. Patent stent with no evidence of endoleak. 2. Stable measurements of the distal abdominal aorta, no change since     09/03/2010.  ___________________________________________ Di Kindle. Edilia Bo, M.D.  SS/MEDQ  D:  10/14/2011  T:  10/14/2011  Job:  [id number]

## 2011-10-19 ENCOUNTER — Telehealth: Payer: Self-pay | Admitting: Internal Medicine

## 2011-10-19 ENCOUNTER — Encounter: Payer: Self-pay | Admitting: Internal Medicine

## 2011-10-19 ENCOUNTER — Ambulatory Visit (INDEPENDENT_AMBULATORY_CARE_PROVIDER_SITE_OTHER): Payer: Medicare Other | Admitting: Internal Medicine

## 2011-10-19 VITALS — BP 114/58 | HR 61 | Temp 97.5°F | Resp 20 | Ht 67.0 in | Wt 188.0 lb

## 2011-10-19 DIAGNOSIS — Z79899 Other long term (current) drug therapy: Secondary | ICD-10-CM

## 2011-10-19 DIAGNOSIS — I10 Essential (primary) hypertension: Secondary | ICD-10-CM

## 2011-10-19 DIAGNOSIS — E785 Hyperlipidemia, unspecified: Secondary | ICD-10-CM

## 2011-10-19 MED ORDER — LOSARTAN POTASSIUM 50 MG PO TABS: 50.0000 mg | ORAL_TABLET | Freq: Every day | ORAL | Status: DC

## 2011-10-19 NOTE — Progress Notes (Signed)
  Subjective:    Patient ID: [name], male    DOB: 03-04-1928, 78 y.o.   MRN: [id number]  HPI Pt presents to clinic for followup of multiple medical problems. Notes diovan is expensive. bp under good control. Tolerating statin tx without adverse effect.  Past Medical History  Diagnosis Date  . CAD (coronary artery disease)   . COPD (chronic obstructive pulmonary disease)   . C. difficile colitis     recurrent diarrhea  . Hyperlipidemia   . HTN (hypertension)   . Osteoarthritis   . BPH (benign prostatic hypertrophy)   . Low back pain   . Lumbar disc disease   . CHF (congestive heart failure)   . Permanent atrial fibrillation   . Colonic polyp   . Diverticulosis of colon   . Anxiety   . GERD (gastroesophageal reflux disease)   . PVD (peripheral vascular disease)   . TIA (transient ischemic attack)   . Diarrhea     recurrent diarrhea  . Tachy-brady syndrome   . Abdominal aneurysm    Past Surgical History  Procedure Date  . Coronary artery bypass graft 1993  . Pacemaker placement 1992 and 2005    permanent and new placement 2005  . Abdominal aortic aneurysm repair     with intraluminal graft  . Aortic valve replacement     s/p   . Retinal detachment surgery     repair of right eye 9/211- Dr Luciana Axe    reports that he has quit smoking. He has never used smokeless tobacco. He reports that he does not drink alcohol. His drug history not on file. family history includes Cancer in his other; Hypertension in his mother; and Stroke in his father and sister. No Known Allergies    Review of Systems see hpi     Objective:   Physical Exam  Physical Exam  Nursing note and vitals reviewed. Constitutional: Appears well-developed and well-nourished. No distress.  HENT:  Head: Normocephalic and atraumatic.  Right Ear: External ear normal.  Left Ear: External ear normal.  Eyes: Conjunctivae are normal. No scleral icterus.  Neck: Neck supple. Carotid bruit is not present.    Cardiovascular: Normal rate, regular rhythm and normal heart sounds.  Exam reveals no gallop and no friction rub.   No murmur heard. Pulmonary/Chest: Effort normal and breath sounds normal. No respiratory distress. He has no wheezes. no rales.  Lymphadenopathy:    He has no cervical adenopathy.  Neurological:Alert.  Skin: Skin is warm and dry. Not diaphoretic.  Psychiatric: Has a normal mood and affect.        Assessment & Plan:

## 2011-10-19 NOTE — Patient Instructions (Signed)
Please schedule fasting labs prior to next visit Cbc, chem7-v58.69, lipid/lft-272.4 

## 2011-10-19 NOTE — Telephone Encounter (Signed)
Lab orders entered for July 2013. 

## 2011-10-24 ENCOUNTER — Encounter: Payer: Self-pay | Admitting: Internal Medicine

## 2011-10-24 NOTE — Assessment & Plan Note (Signed)
Good control but notes diovan expensive. Change to losartan. Monitor bp and report abnormal values. Obtain cbc and chem7 prior to next visit

## 2011-10-24 NOTE — Assessment & Plan Note (Signed)
Stable. Continue statin tx. Obtain lipid/lft prior to next visit 

## 2011-11-03 ENCOUNTER — Telehealth: Payer: Self-pay | Admitting: Internal Medicine

## 2011-11-03 MED ORDER — FINASTERIDE 5 MG PO TABS: 5.0000 mg | ORAL_TABLET | Freq: Every day | ORAL | Status: DC

## 2011-11-03 NOTE — Telephone Encounter (Signed)
Refill sent #30 x 2 refills. 

## 2011-11-09 ENCOUNTER — Ambulatory Visit (INDEPENDENT_AMBULATORY_CARE_PROVIDER_SITE_OTHER): Payer: Medicare Other | Admitting: Pharmacist

## 2011-11-09 DIAGNOSIS — I359 Nonrheumatic aortic valve disorder, unspecified: Secondary | ICD-10-CM

## 2011-11-09 DIAGNOSIS — I4891 Unspecified atrial fibrillation: Secondary | ICD-10-CM

## 2011-11-09 DIAGNOSIS — Z8679 Personal history of other diseases of the circulatory system: Secondary | ICD-10-CM

## 2011-11-09 DIAGNOSIS — Z954 Presence of other heart-valve replacement: Secondary | ICD-10-CM

## 2011-12-08 ENCOUNTER — Telehealth: Payer: Self-pay | Admitting: Internal Medicine

## 2011-12-08 MED ORDER — LORAZEPAM 1 MG PO TABS: ORAL_TABLET | ORAL | Status: DC

## 2011-12-08 NOTE — Telephone Encounter (Signed)
I called in script 

## 2011-12-08 NOTE — Telephone Encounter (Signed)
#  30 [id number] 

## 2011-12-08 NOTE — Telephone Encounter (Signed)
Refill- lorazepam 1mg  tab.

## 2011-12-14 LAB — CBC
Platelets: 202 10*3/uL (ref 150–400)
RBC: 4.08 MIL/uL — ABNORMAL LOW (ref 4.22–5.81)
WBC: 7.2 10*3/uL (ref 4.0–10.5)

## 2011-12-14 LAB — LIPID PANEL
HDL: 28 mg/dL — ABNORMAL LOW (ref >39)
LDL Cholesterol: 58 mg/dL (ref 0–99)
Total CHOL/HDL Ratio: 4.4 Ratio
Triglycerides: 182 mg/dL — ABNORMAL HIGH (ref <150)

## 2011-12-14 NOTE — Addendum Note (Signed)
Addended by: Candie Echevaria L on: 12/14/2011 01:48 PM   Modules accepted: Orders

## 2011-12-15 LAB — HEPATIC FUNCTION PANEL
AST: 31 U/L (ref 0–37)
Albumin: 3.8 g/dL (ref 3.5–5.2)
Total Bilirubin: 0.9 mg/dL (ref 0.3–1.2)
Total Protein: 6.1 g/dL (ref 6.0–8.3)

## 2011-12-15 LAB — BASIC METABOLIC PANEL
CO2: 28 mEq/L (ref 19–32)
Calcium: 9 mg/dL (ref 8.4–10.5)
Chloride: 106 mEq/L (ref 96–112)
Sodium: 143 mEq/L (ref 135–145)

## 2011-12-21 ENCOUNTER — Encounter: Payer: Self-pay | Admitting: Internal Medicine

## 2011-12-21 ENCOUNTER — Ambulatory Visit (INDEPENDENT_AMBULATORY_CARE_PROVIDER_SITE_OTHER): Payer: Medicare Other | Admitting: *Deleted

## 2011-12-21 ENCOUNTER — Ambulatory Visit (INDEPENDENT_AMBULATORY_CARE_PROVIDER_SITE_OTHER): Payer: Medicare Other | Admitting: Internal Medicine

## 2011-12-21 VITALS — BP 122/62 | HR 64 | Temp 98.2°F | Resp 16 | Wt 182.5 lb

## 2011-12-21 DIAGNOSIS — E785 Hyperlipidemia, unspecified: Secondary | ICD-10-CM

## 2011-12-21 DIAGNOSIS — R7309 Other abnormal glucose: Secondary | ICD-10-CM

## 2011-12-21 DIAGNOSIS — I10 Essential (primary) hypertension: Secondary | ICD-10-CM

## 2011-12-21 DIAGNOSIS — I4891 Unspecified atrial fibrillation: Secondary | ICD-10-CM

## 2011-12-21 DIAGNOSIS — Z954 Presence of other heart-valve replacement: Secondary | ICD-10-CM

## 2011-12-21 DIAGNOSIS — I359 Nonrheumatic aortic valve disorder, unspecified: Secondary | ICD-10-CM

## 2011-12-21 DIAGNOSIS — R739 Hyperglycemia, unspecified: Secondary | ICD-10-CM

## 2011-12-21 DIAGNOSIS — Z8679 Personal history of other diseases of the circulatory system: Secondary | ICD-10-CM

## 2011-12-21 LAB — POCT INR: INR: 2.5

## 2011-12-21 NOTE — Assessment & Plan Note (Signed)
Decrease sugar intake. Obtain chem7 and a1c prior to next visit

## 2011-12-21 NOTE — Patient Instructions (Signed)
Please schedule labs prior to next visit Chem7, a1c- hyperglycemia and digoxin-v58.69

## 2011-12-21 NOTE — Assessment & Plan Note (Signed)
Normotensive and stable. Continue current regimen. Monitor bp as outpt and followup in clinic as scheduled.  

## 2011-12-21 NOTE — Progress Notes (Signed)
  Subjective:    Patient ID: [name], male    DOB: May 17, 1928, 78 y.o.   MRN: [id number]  HPI Pt presents to clinic for followup of multiple medical problems. Tolerating change of diovan to losartan. bp reviewed normotensive. Reviewed mildly elevated glucose without h/o DM. States does eat sweets regularly.   Past Medical History  Diagnosis Date  . CAD (coronary artery disease)   . COPD (chronic obstructive pulmonary disease)   . C. difficile colitis     recurrent diarrhea  . Hyperlipidemia   . HTN (hypertension)   . Osteoarthritis   . BPH (benign prostatic hypertrophy)   . Low back pain   . Lumbar disc disease   . CHF (congestive heart failure)   . Permanent atrial fibrillation   . Colonic polyp   . Diverticulosis of colon   . Anxiety   . GERD (gastroesophageal reflux disease)   . PVD (peripheral vascular disease)   . TIA (transient ischemic attack)   . Diarrhea     recurrent diarrhea  . Tachy-brady syndrome   . Abdominal aneurysm    Past Surgical History  Procedure Date  . Coronary artery bypass graft 1993  . Pacemaker placement 1992 and 2005    permanent and new placement 2005  . Abdominal aortic aneurysm repair     with intraluminal graft  . Aortic valve replacement     s/p   . Retinal detachment surgery     repair of right eye 9/211- Dr Luciana Axe    reports that he has quit smoking. His smokeless tobacco use includes Chew. He reports that he does not drink alcohol. His drug history not on file. family history includes Cancer in his other; Hypertension in his mother; and Stroke in his father and sister. No Known Allergies    Review of Systems see hpi     Objective:   Physical Exam  Nursing note and vitals reviewed. Constitutional: He appears well-developed and well-nourished. No distress.  HENT:  Head: Normocephalic and atraumatic.  Right Ear: Tympanic membrane, external ear and ear canal normal.  Left Ear: Tympanic membrane, external ear and ear canal  normal.  Eyes: Conjunctivae are normal.  Neck: Neck supple. Carotid bruit is not present.  Cardiovascular: Normal rate, regular rhythm and normal heart sounds.   Pulmonary/Chest: Effort normal and breath sounds normal. No respiratory distress. He has no wheezes. He has no rales.  Neurological: He is alert.  Skin: Skin is warm and dry. He is not diaphoretic.  Psychiatric: He has a normal mood and affect.          Assessment & Plan:

## 2011-12-21 NOTE — Assessment & Plan Note (Signed)
Stable. Continue statin tx 

## 2012-01-11 ENCOUNTER — Telehealth: Payer: Self-pay | Admitting: Internal Medicine

## 2012-01-11 MED ORDER — SIMVASTATIN 20 MG PO TABS: 20.0000 mg | ORAL_TABLET | Freq: Every day | ORAL | Status: DC

## 2012-01-11 NOTE — Telephone Encounter (Signed)
Refill- simvastatin 20mg  tab. Take one tablet by mouth at bedtime. Qty 90 last fill 5.6.13

## 2012-01-11 NOTE — Telephone Encounter (Signed)
Rx done/SLS 

## 2012-01-28 ENCOUNTER — Encounter: Payer: Self-pay | Admitting: Internal Medicine

## 2012-01-28 NOTE — Telephone Encounter (Signed)
error 

## 2012-02-01 ENCOUNTER — Ambulatory Visit (INDEPENDENT_AMBULATORY_CARE_PROVIDER_SITE_OTHER): Payer: Medicare Other | Admitting: Pharmacist

## 2012-02-01 DIAGNOSIS — Z954 Presence of other heart-valve replacement: Secondary | ICD-10-CM

## 2012-02-01 DIAGNOSIS — I4891 Unspecified atrial fibrillation: Secondary | ICD-10-CM

## 2012-02-01 DIAGNOSIS — I359 Nonrheumatic aortic valve disorder, unspecified: Secondary | ICD-10-CM

## 2012-02-01 DIAGNOSIS — Z8679 Personal history of other diseases of the circulatory system: Secondary | ICD-10-CM

## 2012-02-09 ENCOUNTER — Other Ambulatory Visit: Payer: Self-pay | Admitting: *Deleted

## 2012-02-09 MED ORDER — FINASTERIDE 5 MG PO TABS: 5.0000 mg | ORAL_TABLET | Freq: Every day | ORAL | Status: DC

## 2012-02-09 NOTE — Telephone Encounter (Signed)
Received call from pharmacy requesting refill of finasteride. Verbal given to Zeljana: #30 x 2 refills.

## 2012-02-10 ENCOUNTER — Ambulatory Visit (INDEPENDENT_AMBULATORY_CARE_PROVIDER_SITE_OTHER): Payer: Medicare Other | Admitting: Pharmacist

## 2012-02-10 DIAGNOSIS — Z954 Presence of other heart-valve replacement: Secondary | ICD-10-CM

## 2012-02-10 DIAGNOSIS — Z8679 Personal history of other diseases of the circulatory system: Secondary | ICD-10-CM

## 2012-02-10 DIAGNOSIS — I4891 Unspecified atrial fibrillation: Secondary | ICD-10-CM

## 2012-02-10 DIAGNOSIS — I359 Nonrheumatic aortic valve disorder, unspecified: Secondary | ICD-10-CM

## 2012-02-20 IMAGING — US US ABDOMEN COMPLETE
1 series · 14 of 25 positions shown · non-contrast
Comparison: CT 09/10/2010

CLINICAL DATA: Cholelithiasis.

COMPLETE ABDOMINAL ULTRASOUND

[Series 1: us abdomen complete · 0.32mm/px · 14 of 70 slices shown]
[im 1/70]
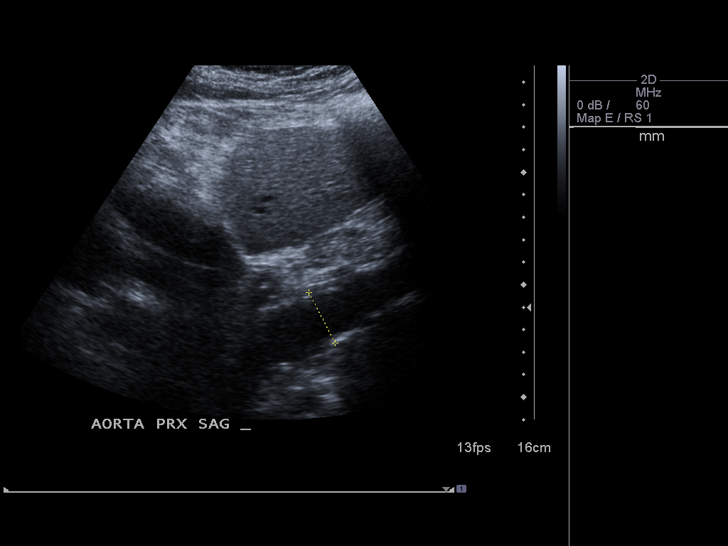
[im 6/70]
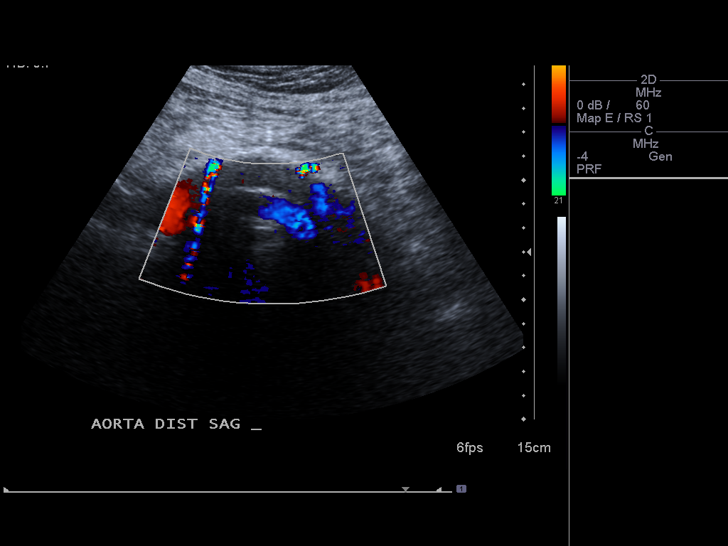
[im 12/70]
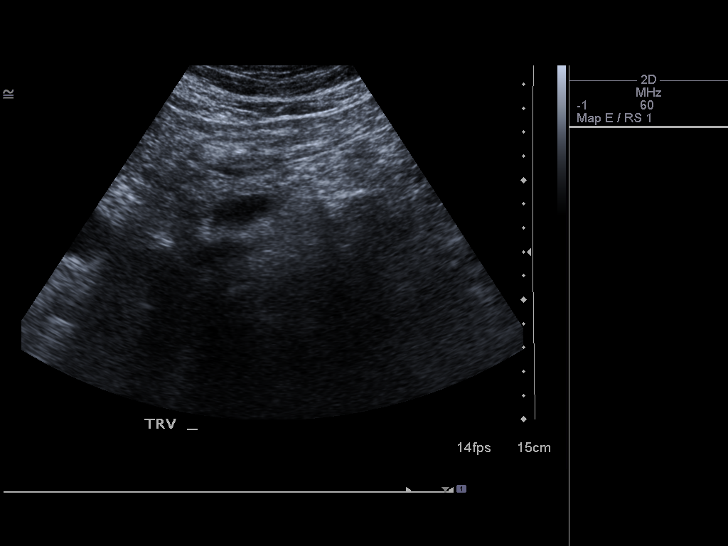
[im 18/70]
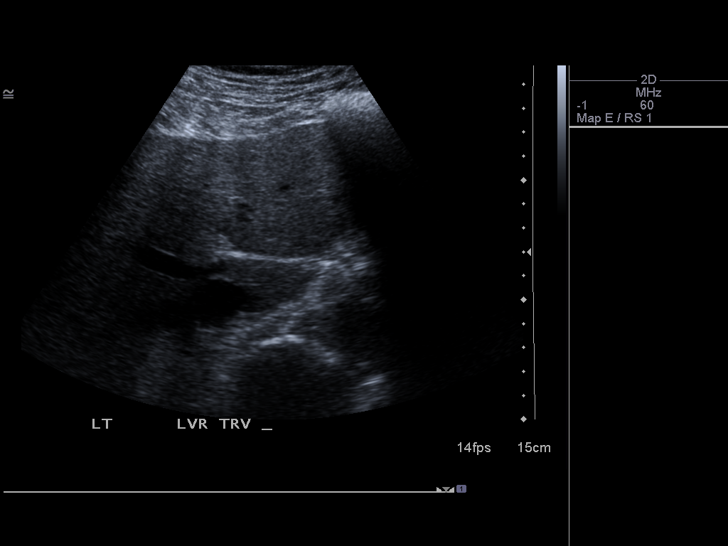
[im 24/70]
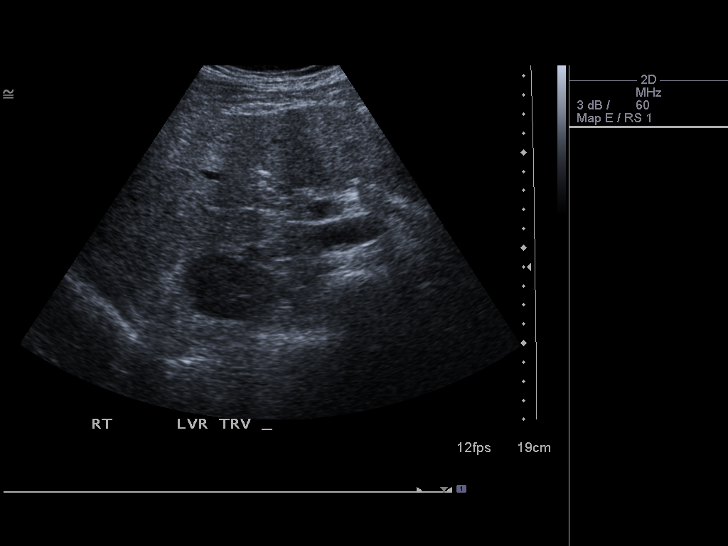
[im 26/70]
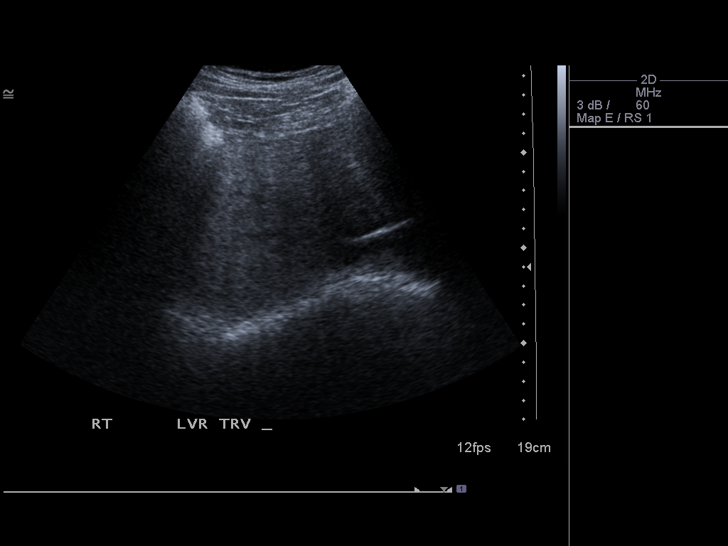
[im 32/70]
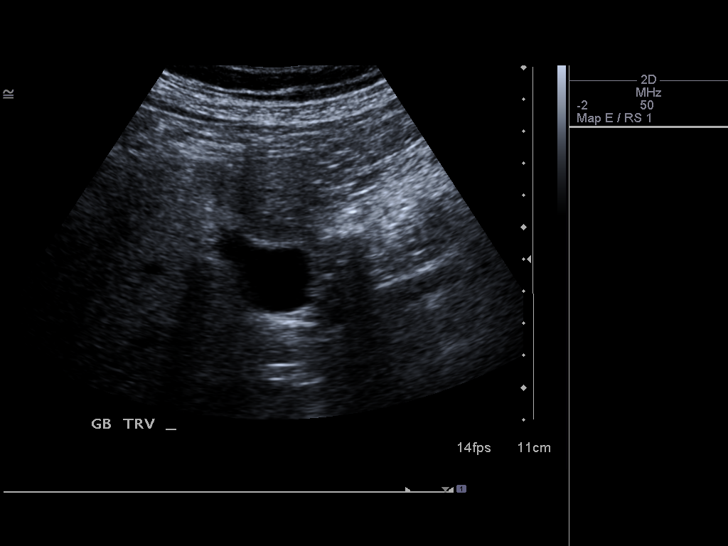
[im 38/70]
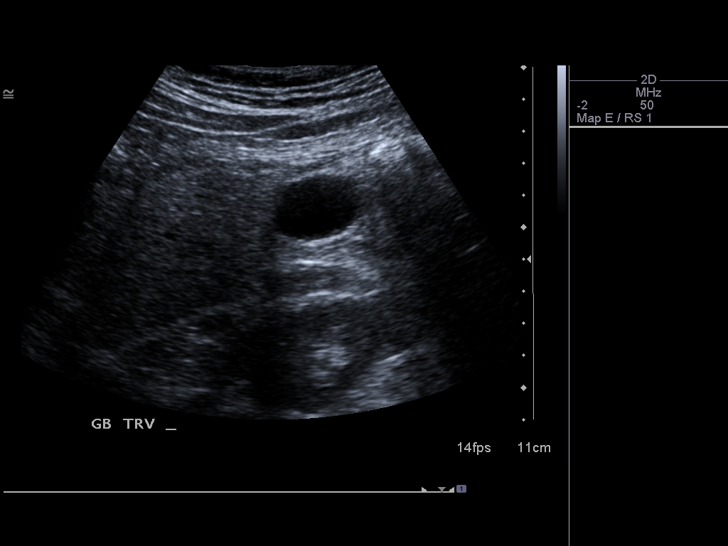
[im 44/70]
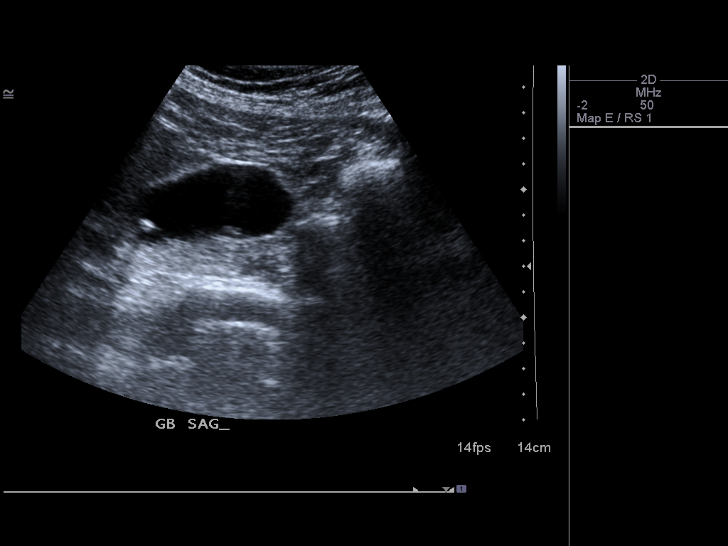
[im 47/70]
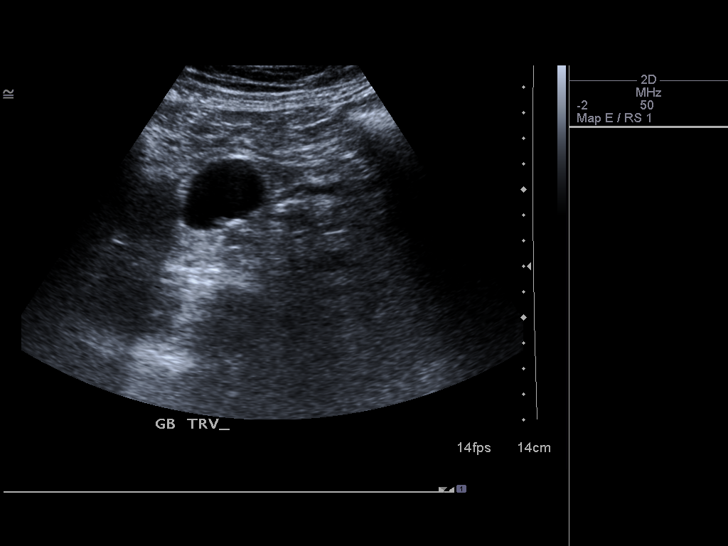
[im 52/70]
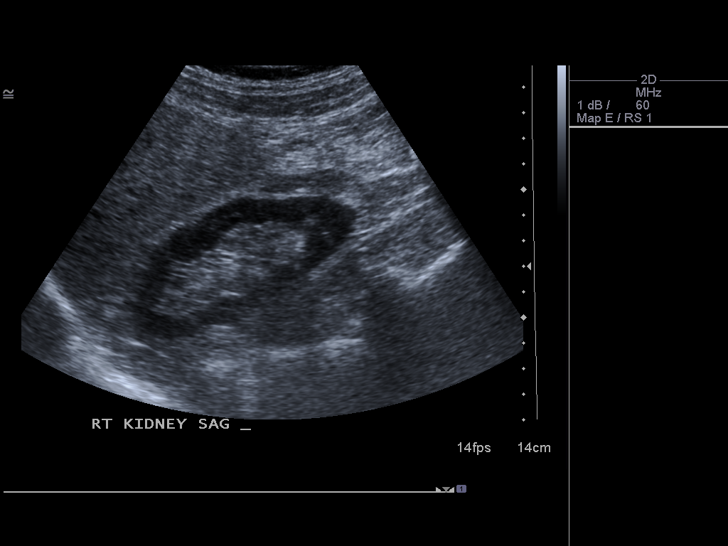
[im 58/70]
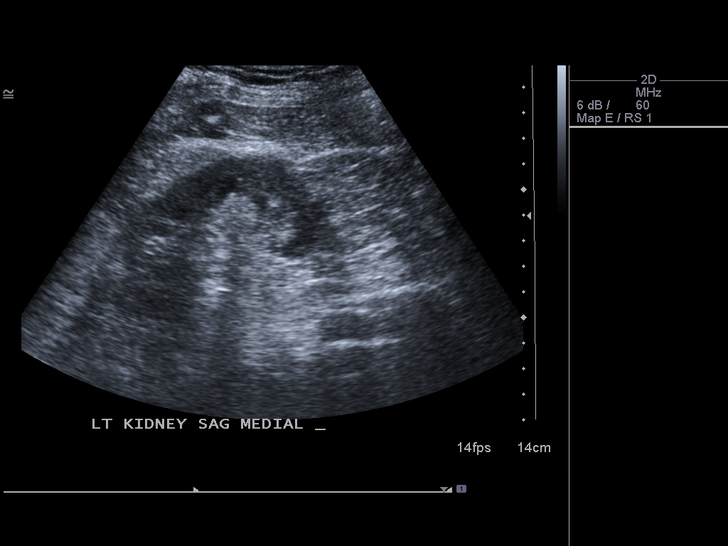
[im 64/70]
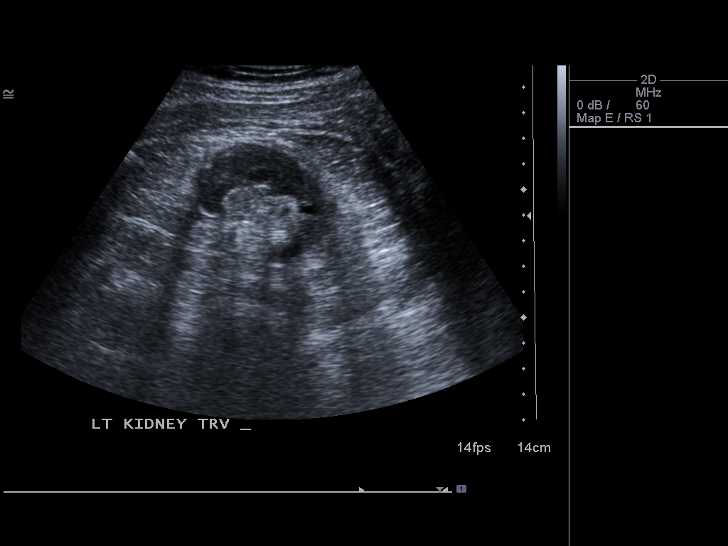
[im 70/70]
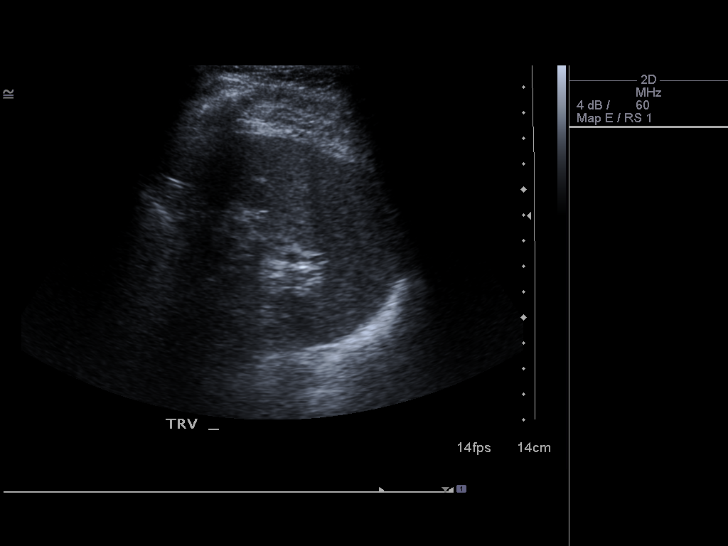

[14 of 25 positions shown; findings below may reference images not displayed]

FINDINGS: Gallbladder:  Two mobile echogenic foci within the gallbladder
compatible with small stones.  No wall thickening or sonographic
Murphy's sign.

Common bile duct:   Normal caliber, 2 mm.  No visible common bile
duct stones.

Liver:  Slight increased echotexture suggesting fatty infiltration.
No biliary ductal dilatation or focal abnormality.

IVC:  Appears normal. Visualization difficult due to overlying
bowel gas.

Pancreas:  No focal abnormality seen. Visualization difficult due
to overlying bowel gas.

Spleen:  Within normal limits in size and echotexture.

Right Kidney:   Normal in size and parenchymal echogenicity.  No
evidence of mass or hydronephrosis.

Left Kidney:  Normal in size and parenchymal echogenicity.  No
evidence of mass or hydronephrosis.

Abdominal aorta:  No aneurysm identified. Visualization difficult
due to overlying bowel gas.
IMPRESSION: Cholelithiasis.  No sonographic evidence of acute cholecystitis.

## 2012-02-24 DIAGNOSIS — I495 Sick sinus syndrome: Secondary | ICD-10-CM

## 2012-03-09 ENCOUNTER — Ambulatory Visit (INDEPENDENT_AMBULATORY_CARE_PROVIDER_SITE_OTHER): Payer: Medicare Other | Admitting: Pharmacist

## 2012-03-09 DIAGNOSIS — I4891 Unspecified atrial fibrillation: Secondary | ICD-10-CM

## 2012-03-09 DIAGNOSIS — Z954 Presence of other heart-valve replacement: Secondary | ICD-10-CM

## 2012-03-09 DIAGNOSIS — I359 Nonrheumatic aortic valve disorder, unspecified: Secondary | ICD-10-CM

## 2012-03-09 DIAGNOSIS — Z8679 Personal history of other diseases of the circulatory system: Secondary | ICD-10-CM

## 2012-03-09 LAB — POCT INR: INR: 2.9

## 2012-03-23 ENCOUNTER — Other Ambulatory Visit: Payer: Self-pay | Admitting: Cardiology

## 2012-04-05 ENCOUNTER — Other Ambulatory Visit: Payer: Self-pay | Admitting: Internal Medicine

## 2012-04-06 NOTE — Telephone Encounter (Signed)
Rx to pharmacy/SLS 

## 2012-04-20 ENCOUNTER — Ambulatory Visit (INDEPENDENT_AMBULATORY_CARE_PROVIDER_SITE_OTHER): Payer: Medicare Other | Admitting: *Deleted

## 2012-04-20 DIAGNOSIS — Z954 Presence of other heart-valve replacement: Secondary | ICD-10-CM

## 2012-04-20 DIAGNOSIS — I4891 Unspecified atrial fibrillation: Secondary | ICD-10-CM

## 2012-04-20 DIAGNOSIS — I359 Nonrheumatic aortic valve disorder, unspecified: Secondary | ICD-10-CM

## 2012-04-20 DIAGNOSIS — Z8679 Personal history of other diseases of the circulatory system: Secondary | ICD-10-CM

## 2012-04-21 ENCOUNTER — Encounter: Payer: Self-pay | Admitting: Internal Medicine

## 2012-04-21 ENCOUNTER — Ambulatory Visit (INDEPENDENT_AMBULATORY_CARE_PROVIDER_SITE_OTHER): Payer: Medicare Other | Admitting: Internal Medicine

## 2012-04-21 VITALS — BP 120/58 | HR 60 | Temp 97.7°F | Resp 16 | Wt 184.5 lb

## 2012-04-21 DIAGNOSIS — R739 Hyperglycemia, unspecified: Secondary | ICD-10-CM

## 2012-04-21 DIAGNOSIS — G4700 Insomnia, unspecified: Secondary | ICD-10-CM

## 2012-04-21 DIAGNOSIS — R269 Unspecified abnormalities of gait and mobility: Secondary | ICD-10-CM

## 2012-04-21 DIAGNOSIS — M542 Cervicalgia: Secondary | ICD-10-CM

## 2012-04-21 DIAGNOSIS — R7309 Other abnormal glucose: Secondary | ICD-10-CM

## 2012-04-21 DIAGNOSIS — Z79899 Other long term (current) drug therapy: Secondary | ICD-10-CM

## 2012-04-22 LAB — CBC WITH DIFFERENTIAL/PLATELET
Basophils Absolute: 0 10*3/uL (ref 0.0–0.1)
Basophils Relative: 0 % (ref 0–1)
Eosinophils Relative: 5 % (ref 0–5)
HCT: 37.7 % — ABNORMAL LOW (ref 39.0–52.0)
MCHC: 33.7 g/dL (ref 30.0–36.0)
Monocytes Absolute: 0.7 10*3/uL (ref 0.1–1.0)
Neutro Abs: 4.7 10*3/uL (ref 1.7–7.7)
RDW: 13.7 % (ref 11.5–15.5)

## 2012-04-22 LAB — BASIC METABOLIC PANEL
BUN: 16 mg/dL (ref 6–23)
Calcium: 8.8 mg/dL (ref 8.4–10.5)
Chloride: 104 mEq/L (ref 96–112)
Creat: 1.02 mg/dL (ref 0.50–1.35)

## 2012-04-22 LAB — HEMOGLOBIN A1C
Hgb A1c MFr Bld: 6.3 % — ABNORMAL HIGH (ref <5.7)
Mean Plasma Glucose: 134 mg/dL — ABNORMAL HIGH (ref <117)

## 2012-04-24 DIAGNOSIS — M542 Cervicalgia: Secondary | ICD-10-CM | POA: Insufficient documentation

## 2012-04-24 NOTE — Assessment & Plan Note (Signed)
Obtain Chem-7 and A1c 

## 2012-04-24 NOTE — Assessment & Plan Note (Signed)
Concern over possible contribution to gait instability. Attempt to wean Ativan every other day

## 2012-04-24 NOTE — Assessment & Plan Note (Signed)
Appears msk etiology. Avoid nsaids due to anticoagulation and sedating medications. Agree with continued heat and massage.

## 2012-04-24 NOTE — Progress Notes (Signed)
  Subjective:    Patient ID: [name], male    DOB: 28-May-1928, 78 y.o.   MRN: [id number]  HPI Pt presents to clinic for evaluation of neck pain. Has ~ 1week h/o bilateral posterior/lateral neck pain without injury/trauma. Does not radiate down arms and no paresthesia. Has chronic gait instability and is using a cane. History of chronic insomnia taking Ativan one half milligram at night. No history of mild hyperglycemia without known diagnosis of diabetes mellitus. No other complaints.  Past Medical History  Diagnosis Date  . CAD (coronary artery disease)   . COPD (chronic obstructive pulmonary disease)   . C. difficile colitis     recurrent diarrhea  . Hyperlipidemia   . HTN (hypertension)   . Osteoarthritis   . BPH (benign prostatic hypertrophy)   . Low back pain   . Lumbar disc disease   . CHF (congestive heart failure)   . Permanent atrial fibrillation   . Colonic polyp   . Diverticulosis of colon   . Anxiety   . GERD (gastroesophageal reflux disease)   . PVD (peripheral vascular disease)   . TIA (transient ischemic attack)   . Diarrhea     recurrent diarrhea  . Tachy-brady syndrome   . Abdominal aneurysm    Past Surgical History  Procedure Date  . Coronary artery bypass graft 1993  . Pacemaker placement 1992 and 2005    permanent and new placement 2005  . Abdominal aortic aneurysm repair     with intraluminal graft  . Aortic valve replacement     s/p   . Retinal detachment surgery     repair of right eye 9/211- Dr Luciana Axe    reports that he has quit smoking. His smokeless tobacco use includes Chew. He reports that he does not drink alcohol. His drug history not on file. family history includes Cancer in his other; Hypertension in his mother; and Stroke in his father and sister. No Known Allergies   Review of Systems see history of present illness     Objective:   Physical Exam  Nursing note and vitals reviewed. Constitutional: He appears well-developed and  well-nourished. No distress.  HENT:  Head: Normocephalic and atraumatic.  Right Ear: Tympanic membrane, external ear and ear canal normal.  Left Ear: Tympanic membrane, external ear and ear canal normal.  Eyes: Conjunctivae normal are normal. No scleral icterus.  Neck: Neck supple.  Skin: He is not diaphoretic.          Assessment & Plan:

## 2012-04-24 NOTE — Assessment & Plan Note (Signed)
Recommend physical therapy and patient declines

## 2012-04-25 ENCOUNTER — Other Ambulatory Visit: Payer: Self-pay | Admitting: Internal Medicine

## 2012-04-25 ENCOUNTER — Ambulatory Visit: Payer: Medicare Other | Admitting: Internal Medicine

## 2012-04-25 NOTE — Telephone Encounter (Signed)
Rx to pharmacy/SLS 

## 2012-05-17 ENCOUNTER — Other Ambulatory Visit: Payer: Self-pay | Admitting: Internal Medicine

## 2012-05-18 ENCOUNTER — Other Ambulatory Visit: Payer: Self-pay | Admitting: *Deleted

## 2012-05-18 MED ORDER — LORAZEPAM 1 MG PO TABS: ORAL_TABLET | ORAL | Status: DC

## 2012-05-18 NOTE — Telephone Encounter (Signed)
Per Dr Rodena Medin ok #[phone], Rx called in

## 2012-05-24 ENCOUNTER — Ambulatory Visit (INDEPENDENT_AMBULATORY_CARE_PROVIDER_SITE_OTHER): Payer: Medicare Other | Admitting: *Deleted

## 2012-05-24 DIAGNOSIS — Z954 Presence of other heart-valve replacement: Secondary | ICD-10-CM

## 2012-05-24 DIAGNOSIS — I4891 Unspecified atrial fibrillation: Secondary | ICD-10-CM

## 2012-05-24 DIAGNOSIS — I359 Nonrheumatic aortic valve disorder, unspecified: Secondary | ICD-10-CM

## 2012-05-24 DIAGNOSIS — Z8679 Personal history of other diseases of the circulatory system: Secondary | ICD-10-CM

## 2012-05-24 LAB — POCT INR: INR: 3.4

## 2012-05-25 ENCOUNTER — Encounter: Payer: Self-pay | Admitting: Internal Medicine

## 2012-05-25 DIAGNOSIS — I495 Sick sinus syndrome: Secondary | ICD-10-CM

## 2012-05-30 ENCOUNTER — Other Ambulatory Visit: Payer: Self-pay | Admitting: Cardiology

## 2012-06-23 ENCOUNTER — Encounter: Payer: Self-pay | Admitting: Internal Medicine

## 2012-06-23 ENCOUNTER — Ambulatory Visit (INDEPENDENT_AMBULATORY_CARE_PROVIDER_SITE_OTHER): Payer: Medicare Other | Admitting: Internal Medicine

## 2012-06-23 VITALS — BP 151/60 | HR 58 | Temp 98.0°F | Resp 18 | Wt 177.8 lb

## 2012-06-23 DIAGNOSIS — Z23 Encounter for immunization: Secondary | ICD-10-CM

## 2012-06-23 DIAGNOSIS — S2239XA Fracture of one rib, unspecified side, initial encounter for closed fracture: Secondary | ICD-10-CM

## 2012-06-23 MED ORDER — TRAMADOL HCL 50 MG PO TABS: ORAL_TABLET | ORAL | Status: DC

## 2012-06-23 NOTE — Progress Notes (Signed)
  Subjective:    Patient ID: [name], male    DOB: June 13, 1927, 79 y.o.   MRN: [id number]  HPI Pt presents to clinic for ED follow up of rib fracture. Suffered recent fall from a seated position after leaning over. Struck his left ant chest against his hand but had no LOC. Evaluated at [facility] hospital with dx of left rib fx. No records currently available. Was given ultram for pain control and tolerates without side effect. States pain improved but not resolved and his sleep is interfered with. Was given incentive spirometer to use and recent mild cough improved with use of this. No f/c or worsening cough. Does not feel sick.  Past Medical History  Diagnosis Date  . CAD (coronary artery disease)   . COPD (chronic obstructive pulmonary disease)   . C. difficile colitis     recurrent diarrhea  . Hyperlipidemia   . HTN (hypertension)   . Osteoarthritis   . BPH (benign prostatic hypertrophy)   . Low back pain   . Lumbar disc disease   . CHF (congestive heart failure)   . Permanent atrial fibrillation   . Colonic polyp   . Diverticulosis of colon   . Anxiety   . GERD (gastroesophageal reflux disease)   . PVD (peripheral vascular disease)   . TIA (transient ischemic attack)   . Diarrhea     recurrent diarrhea  . Tachy-brady syndrome   . Abdominal aneurysm    Past Surgical History  Procedure Date  . Coronary artery bypass graft 1993  . Pacemaker placement 1992 and 2005    permanent and new placement 2005  . Abdominal aortic aneurysm repair     with intraluminal graft  . Aortic valve replacement     s/p   . Retinal detachment surgery     repair of right eye 9/211- Dr Luciana Axe    reports that he has quit smoking. His smokeless tobacco use includes Chew. He reports that he does not drink alcohol. His drug history not on file. family history includes Cancer in his other; Hypertension in his mother; and Stroke in his father and sister. No Known Allergies   Review of Systems  see hpi    Objective:   Physical Exam  Nursing note and vitals reviewed. Constitutional: He appears well-developed and well-nourished. No distress.  HENT:  Head: Normocephalic and atraumatic.  Right Ear: External ear normal.  Left Ear: External ear normal.  Eyes: Conjunctivae normal are normal. No scleral icterus.  Cardiovascular: Normal rate and regular rhythm.   Pulmonary/Chest: Effort normal and breath sounds normal. No respiratory distress. He has no wheezes. He has no rales.  Neurological: He is alert.  Skin: Skin is warm and dry. He is not diaphoretic.  Psychiatric: He has a normal mood and affect.          Assessment & Plan:

## 2012-06-23 NOTE — Assessment & Plan Note (Signed)
Improved but persistent pain. Pt wishes to avoid narcotic medication. Refill tramadol with ability to take 1 or 2 tablets at the time. Followup if no improvement or worsening.

## 2012-06-29 ENCOUNTER — Other Ambulatory Visit: Payer: Self-pay | Admitting: Cardiology

## 2012-07-05 ENCOUNTER — Ambulatory Visit (INDEPENDENT_AMBULATORY_CARE_PROVIDER_SITE_OTHER): Payer: Medicare Other | Admitting: *Deleted

## 2012-07-05 DIAGNOSIS — Z954 Presence of other heart-valve replacement: Secondary | ICD-10-CM

## 2012-07-05 DIAGNOSIS — I359 Nonrheumatic aortic valve disorder, unspecified: Secondary | ICD-10-CM

## 2012-07-05 DIAGNOSIS — I4891 Unspecified atrial fibrillation: Secondary | ICD-10-CM

## 2012-07-05 DIAGNOSIS — Z8679 Personal history of other diseases of the circulatory system: Secondary | ICD-10-CM

## 2012-07-17 ENCOUNTER — Other Ambulatory Visit: Payer: Self-pay | Admitting: Internal Medicine

## 2012-07-22 ENCOUNTER — Ambulatory Visit: Payer: Medicare Other | Admitting: Family

## 2012-07-26 ENCOUNTER — Other Ambulatory Visit: Payer: Self-pay | Admitting: Cardiology

## 2012-08-15 ENCOUNTER — Other Ambulatory Visit: Payer: Self-pay | Admitting: Internal Medicine

## 2012-08-15 NOTE — Telephone Encounter (Signed)
Losartan refill sent to pharmacy. Pt is due for follow up, please call pt to arrange.

## 2012-08-16 ENCOUNTER — Ambulatory Visit (INDEPENDENT_AMBULATORY_CARE_PROVIDER_SITE_OTHER): Payer: Medicare Other | Admitting: *Deleted

## 2012-08-16 DIAGNOSIS — I359 Nonrheumatic aortic valve disorder, unspecified: Secondary | ICD-10-CM

## 2012-08-16 NOTE — Telephone Encounter (Signed)
Spoke to patients daughter and she scheduled an appointment for 09/01/12

## 2012-08-16 NOTE — Telephone Encounter (Signed)
Left message for patient to return my call.

## 2012-08-29 ENCOUNTER — Other Ambulatory Visit: Payer: Self-pay | Admitting: Cardiology

## 2012-09-01 ENCOUNTER — Telehealth: Payer: Self-pay | Admitting: Family

## 2012-09-01 ENCOUNTER — Ambulatory Visit (INDEPENDENT_AMBULATORY_CARE_PROVIDER_SITE_OTHER): Payer: Medicare Other | Admitting: Family

## 2012-09-01 VITALS — BP 140/64 | HR 60 | Temp 98.0°F | Resp 16 | Wt 179.0 lb

## 2012-09-01 DIAGNOSIS — E785 Hyperlipidemia, unspecified: Secondary | ICD-10-CM

## 2012-09-01 DIAGNOSIS — L89152 Pressure ulcer of sacral region, stage 2: Secondary | ICD-10-CM | POA: Insufficient documentation

## 2012-09-01 DIAGNOSIS — L89109 Pressure ulcer of unspecified part of back, unspecified stage: Secondary | ICD-10-CM

## 2012-09-01 DIAGNOSIS — I10 Essential (primary) hypertension: Secondary | ICD-10-CM

## 2012-09-01 DIAGNOSIS — L8992 Pressure ulcer of unspecified site, stage 2: Secondary | ICD-10-CM

## 2012-09-01 DIAGNOSIS — Z5181 Encounter for therapeutic drug level monitoring: Secondary | ICD-10-CM

## 2012-09-01 LAB — CBC WITH DIFFERENTIAL/PLATELET
Basophils Absolute: 0 10*3/uL (ref 0.0–0.1)
Eosinophils Relative: 2 % (ref 0–5)
Lymphocytes Relative: 13 % (ref 12–46)
MCV: 93.9 fL (ref 78.0–100.0)
Neutrophils Relative %: 73 % (ref 43–77)
Platelets: 216 10*3/uL (ref 150–400)
RDW: 14.6 % (ref 11.5–15.5)
WBC: 9.8 10*3/uL (ref 4.0–10.5)

## 2012-09-01 LAB — BASIC METABOLIC PANEL
BUN: 16 mg/dL (ref 6–23)
CO2: 28 mEq/L (ref 19–32)
Chloride: 102 mEq/L (ref 96–112)
Glucose, Bld: 118 mg/dL — ABNORMAL HIGH (ref 70–99)
Potassium: 5 mEq/L (ref 3.5–5.3)

## 2012-09-01 LAB — HEPATIC FUNCTION PANEL
ALT: 24 U/L (ref 0–53)
AST: 30 U/L (ref 0–37)
Albumin: 4.1 g/dL (ref 3.5–5.2)
Total Protein: 6.7 g/dL (ref 6.0–8.3)

## 2012-09-01 MED ORDER — FUROSEMIDE 20 MG PO TABS: 20.0000 mg | ORAL_TABLET | Freq: Every day | ORAL | Status: DC

## 2012-09-01 MED ORDER — AMLODIPINE BESYLATE 5 MG PO TABS: ORAL_TABLET | ORAL | Status: DC

## 2012-09-01 MED ORDER — FINASTERIDE 5 MG PO TABS: 5.0000 mg | ORAL_TABLET | Freq: Every day | ORAL | Status: DC

## 2012-09-01 MED ORDER — NYSTATIN 100000 UNIT/GM EX OINT: TOPICAL_OINTMENT | Freq: Two times a day (BID) | CUTANEOUS | Status: AC

## 2012-09-01 MED ORDER — DUODERM CGF DRESSING EX MISC: 1.0000 | CUTANEOUS | Status: DC

## 2012-09-01 NOTE — Patient Instructions (Addendum)
Complete lab work prior to leaving.  Please change dressing to bottom every 3 days.   Call if redness increases or if the open area on his tail bone becomes larger or deeper or if an odor develops from the area. Follow up in 1 month.

## 2012-09-01 NOTE — Assessment & Plan Note (Signed)
We discussed purchasing egg crate foam to line his chair.  Recommended duoderm to affected area every 3 days.  Keep area dry and clean. Needs frequent repositioning- recommended that he change position- such as laying on cough and laying on left and right.  Get up to move around frequently.  Check albumin today to check nutritional status.  Daughter will work with pt on these things and I have asked her to keep a close eye on this.

## 2012-09-01 NOTE — Telephone Encounter (Signed)
Left message on Daughter's cell.  When she calls back pls let her know that I would like for him to add furosemide 20mg  once daily for swelling and return to the office in 1 week so that we can repeat his lab work and blood pressure.

## 2012-09-01 NOTE — Progress Notes (Signed)
Subjective:    Patient ID: [name], male    DOB: 06-16-1927, 79 y.o.   MRN: [id number]  HPI  [name] is an 79 yr old male who presents today for follow up.  He presents today with his daughter.   HTN-  Pt is maintained on amlodipine, coreg, losartan.  Edema- Family member notes LE edema.  Patient denies CP or SOB.  + ulcer on buttocks- daughter notes redness on his bottom. Wants to know what to do for this.  Says he spends much of his day in his automatic lift chair. She has a pillow on the chair for comfort.   Review of Systems See HPI  Past Medical History  Diagnosis Date  . CAD (coronary artery disease)   . COPD (chronic obstructive pulmonary disease)   . C. difficile colitis     recurrent diarrhea  . Hyperlipidemia   . HTN (hypertension)   . Osteoarthritis   . BPH (benign prostatic hypertrophy)   . Low back pain   . Lumbar disc disease   . CHF (congestive heart failure)   . Permanent atrial fibrillation   . Colonic polyp   . Diverticulosis of colon   . Anxiety   . GERD (gastroesophageal reflux disease)   . PVD (peripheral vascular disease)   . TIA (transient ischemic attack)   . Diarrhea     recurrent diarrhea  . Tachy-brady syndrome   . Abdominal aneurysm     History   Social History  . Marital Status: Widowed    Spouse Name: N/A    Number of Children: N/A  . Years of Education: N/A   Occupational History  . Not on file.   Social History Main Topics  . Smoking status: Former Games developer  . Smokeless tobacco: Current User    Types: Chew  . Alcohol Use: No  . Drug Use: Not on file  . Sexually Active: Not on file   Other Topics Concern  . Not on file   Social History Narrative   Former Smoker     Alcohol use-no   Widowed                Past Surgical History  Procedure Laterality Date  . Coronary artery bypass graft  1993  . Pacemaker placement  1992 and 2005    permanent and new placement 2005  . Abdominal aortic aneurysm repair      with intraluminal graft  . Aortic valve replacement      s/p   . Retinal detachment surgery      repair of right eye 9/211- Dr Luciana Axe    Family History  Problem Relation Age of Onset  . Hypertension Mother   . Stroke Father     died of stroke  . Stroke Sister     died of stroke  . Cancer Other     wife diagnosed with advanced bladder cancer-deceased    No Known Allergies  Current Outpatient Prescriptions on File Prior to Visit  Medication Sig Dispense Refill  . carvedilol (COREG) 25 MG tablet TAKE ONE TABLET BY MOUTH TWICE DAILY  60 tablet  12  . colchicine 0.6 MG tablet One po bid prn gout      . COUMADIN 5 MG tablet USE AS DIRECTED BY ANTICOAG ULATION CLINIC  40 tablet  3  . LANOXIN 0.125 MG tablet TAKE ONE TABLET BY MOUTH ONE TIME DAILY  30 tablet  0  . LORazepam (ATIVAN) 1 MG tablet Take  1/2 tablet by mouth once a day as needed  30 tablet  2  . losartan (COZAAR) 50 MG tablet TAKE ONE TABLET BY MOUTH ONE TIME DAILY  30 tablet  2  . omeprazole (PRILOSEC) 20 MG capsule Take 20 mg by mouth daily. For reflux       . Silodosin (RAPAFLO PO) Take 8 mg by mouth daily. To help with urinary flow      . simvastatin (ZOCOR) 20 MG tablet TAKE ONE TABLET BY MOUTH AT BEDTIME  90 tablet  0  . traMADol (ULTRAM) 50 MG tablet 1-2 by mouth up to three times a day as needed for pain  45 tablet  1   No current facility-administered medications on file prior to visit.    BP 140/64  Pulse 60  Temp(Src) 98 F (36.7 C) (Oral)  Resp 16  Wt 179 lb (81.194 kg)  BMI 28.03 kg/m2  SpO2 98%       Objective:   Physical Exam  Constitutional: He appears well-developed and well-nourished. No distress.  HENT:  Head: Normocephalic and atraumatic.  Cardiovascular: Normal rate and regular rhythm.   No murmur heard. Pulmonary/Chest: Effort normal and breath sounds normal. No respiratory distress. He has no wheezes. He has no rales. He exhibits no tenderness.  Musculoskeletal:  2-3+ bilateral LE  edema.    Skin:     Stage I sacral decub ulcer noted overlying upper buttocks as noted. Small area of skin breakdown (stage II) at top of gluteal fold.   Psychiatric: He has a normal mood and affect. His behavior is normal. Judgment and thought content normal.          Assessment & Plan:

## 2012-09-01 NOTE — Telephone Encounter (Signed)
Notified pt's daughter and she voices understanding. She is concerned about adding fluid pill as pt gets up multiple times through the night. Advised daughter per verbal from Provider to have pt take medication in the morning to reduce night time effects. She voices understanding. Appt scheduled for 09/08/12 at 10:30am.

## 2012-09-01 NOTE — Assessment & Plan Note (Signed)
BP Readings from Last 3 Encounters:  09/01/12 140/64  06/23/12 151/60  04/21/12 120/58   BP fair today.  Will add lasix for edema and see how his BP does with this change.  (see phone note).  Plan follow up in 1 week for BMET.

## 2012-09-07 ENCOUNTER — Encounter: Payer: Self-pay | Admitting: Family

## 2012-09-08 ENCOUNTER — Ambulatory Visit: Payer: Medicare Other | Admitting: Family

## 2012-09-26 ENCOUNTER — Other Ambulatory Visit: Payer: Self-pay | Admitting: Cardiology

## 2012-09-26 DIAGNOSIS — I495 Sick sinus syndrome: Secondary | ICD-10-CM

## 2012-09-27 ENCOUNTER — Telehealth: Payer: Self-pay | Admitting: Internal Medicine

## 2012-09-27 MED ORDER — FUROSEMIDE 20 MG PO TABS: 20.0000 mg | ORAL_TABLET | Freq: Every day | ORAL | Status: DC

## 2012-09-27 NOTE — Telephone Encounter (Signed)
Refill- furosemide 20 mg tablet. Take one tablet by mouth daily. Qty 30 last fill 3.27.14

## 2012-09-30 ENCOUNTER — Ambulatory Visit (INDEPENDENT_AMBULATORY_CARE_PROVIDER_SITE_OTHER): Payer: Medicare Other | Admitting: Pharmacist

## 2012-09-30 ENCOUNTER — Ambulatory Visit (INDEPENDENT_AMBULATORY_CARE_PROVIDER_SITE_OTHER): Payer: Medicare Other | Admitting: Internal Medicine

## 2012-09-30 ENCOUNTER — Encounter: Payer: Self-pay | Admitting: Internal Medicine

## 2012-09-30 VITALS — BP 116/58 | HR 59 | Ht 67.0 in | Wt 178.6 lb

## 2012-09-30 DIAGNOSIS — Z950 Presence of cardiac pacemaker: Secondary | ICD-10-CM

## 2012-09-30 DIAGNOSIS — I4891 Unspecified atrial fibrillation: Secondary | ICD-10-CM

## 2012-09-30 DIAGNOSIS — Z954 Presence of other heart-valve replacement: Secondary | ICD-10-CM

## 2012-09-30 DIAGNOSIS — Z8679 Personal history of other diseases of the circulatory system: Secondary | ICD-10-CM

## 2012-09-30 DIAGNOSIS — I10 Essential (primary) hypertension: Secondary | ICD-10-CM

## 2012-09-30 DIAGNOSIS — I359 Nonrheumatic aortic valve disorder, unspecified: Secondary | ICD-10-CM

## 2012-09-30 LAB — PACEMAKER DEVICE OBSERVATION
AL AMPLITUDE: 0.9 mv
AL IMPEDENCE PM: 344 Ohm
BRDY-0002RV: 60 {beats}/min
RV LEAD THRESHOLD: 0.625 V

## 2012-09-30 LAB — POCT INR: INR: 2.4

## 2012-09-30 NOTE — Assessment & Plan Note (Signed)
His ventricular rate appears to be well-controlled. He will continue his current medical therapy.

## 2012-09-30 NOTE — Assessment & Plan Note (Signed)
His blood pressure is well controlled. He will continue his current medical therapy, and maintain a low-sodium diet.

## 2012-09-30 NOTE — Progress Notes (Signed)
HPI [name] returns today for followup.  He is a very pleasant 79 year old man with a history of symptomatic bradycardia, atrial fibrillation, status post permanent pacemaker insertion, and hypertension. In the interim, he has done well. He denies chest pain or shortness of breath. No Known Allergies   Current Outpatient Prescriptions  Medication Sig Dispense Refill  . amLODipine (NORVASC) 5 MG tablet TAKE ONE TABLET BY MOUTH ONE TIME DAILY  90 tablet  1  . carvedilol (COREG) 25 MG tablet TAKE ONE TABLET BY MOUTH TWICE DAILY  60 tablet  12  . colchicine 0.6 MG tablet One po bid prn gout      . Control Gel Formula Dressing (DUODERM CGF DRESSING) MISC Apply 1 each topically every 3 (three) days.  20 each  1  . COUMADIN 5 MG tablet USE AS DIRECTED BY ANTICOAG ULATION CLINIC  40 tablet  3  . DIGOX 0.125 MG tablet TAKE ONE TABLET BY MOUTH ONE TIME DAILY  30 tablet  0  . finasteride (PROSCAR) 5 MG tablet Take 1 tablet (5 mg total) by mouth daily.  30 tablet  3  . LORazepam (ATIVAN) 1 MG tablet Take 1/2 tablet by mouth once a day as needed  30 tablet  2  . losartan (COZAAR) 50 MG tablet TAKE ONE TABLET BY MOUTH ONE TIME DAILY  30 tablet  2  . nystatin ointment (MYCOSTATIN) Apply topically 2 (two) times daily. Apply twice daily to buttocks/rash until healed  30 g  1  . omeprazole (PRILOSEC) 20 MG capsule Take 20 mg by mouth daily. For reflux       . Silodosin (RAPAFLO PO) Take 8 mg by mouth daily. To help with urinary flow      . simvastatin (ZOCOR) 20 MG tablet TAKE ONE TABLET BY MOUTH AT BEDTIME  90 tablet  0  . traMADol (ULTRAM) 50 MG tablet 1-2 by mouth up to three times a day as needed for pain  45 tablet  1   No current facility-administered medications for this visit.     Past Medical History  Diagnosis Date  . CAD (coronary artery disease)   . COPD (chronic obstructive pulmonary disease)   . C. difficile colitis     recurrent diarrhea  . Hyperlipidemia   . HTN (hypertension)   .  Osteoarthritis   . BPH (benign prostatic hypertrophy)   . Low back pain   . Lumbar disc disease   . CHF (congestive heart failure)   . Permanent atrial fibrillation   . Colonic polyp   . Diverticulosis of colon   . Anxiety   . GERD (gastroesophageal reflux disease)   . PVD (peripheral vascular disease)   . TIA (transient ischemic attack)   . Diarrhea     recurrent diarrhea  . Tachy-brady syndrome   . Abdominal aneurysm     ROS:   All systems reviewed and negative except as noted in the HPI.   Past Surgical History  Procedure Laterality Date  . Coronary artery bypass graft  1993  . Pacemaker placement  1992 and 2005    permanent and new placement 2005  . Abdominal aortic aneurysm repair      with intraluminal graft  . Aortic valve replacement      s/p   . Retinal detachment surgery      repair of right eye 9/211- Dr Luciana Axe     Family History  Problem Relation Age of Onset  . Hypertension Mother   .  Stroke Father     died of stroke  . Stroke Sister     died of stroke  . Cancer Other     wife diagnosed with advanced bladder cancer-deceased     History   Social History  . Marital Status: Widowed    Spouse Name: N/A    Number of Children: N/A  . Years of Education: N/A   Occupational History  . Not on file.   Social History Main Topics  . Smoking status: Former Games developer  . Smokeless tobacco: Current User    Types: Chew  . Alcohol Use: No  . Drug Use: Not on file  . Sexually Active: Not on file   Other Topics Concern  . Not on file   Social History Narrative   Former Smoker     Alcohol use-no   Widowed                 BP 116/58  Pulse 59  Ht 5\' 7"  (1.702 m)  Wt 178 lb 9.6 oz (81.012 kg)  BMI 27.97 kg/m2  Physical Exam:  Well appearing 79 year old man,NAD HEENT: Unremarkable Neck:  No JVD, no thyromegally Back:  No CVA tenderness Lungs:  Clear with no wheezes, rales, or rhonchi. HEART:  Regular rate rhythm, no murmurs, no rubs, no  clicks Abd:  soft, positive bowel sounds, no organomegally, no rebound, no guarding Ext:  2 plus pulses, no edema, no cyanosis, no clubbing Skin:  No rashes no nodules Neuro:  CN II through XII intact, motor grossly intact  EKG - atrial fibrillation with ventricular pacing  DEVICE  Normal device function.  See PaceArt for details.   Assess/Plan:

## 2012-09-30 NOTE — Patient Instructions (Addendum)
Your physician wants you to follow-up in: 12 months with Dr. Taylor. You will receive a reminder letter in the mail two months in advance. If you don't receive a letter, please call our office to schedule the follow-up appointment.    

## 2012-09-30 NOTE — Assessment & Plan Note (Signed)
His [employer]. Jude dual-chamber pacemaker is working normally. We'll plan to recheck in several months. 

## 2012-10-04 ENCOUNTER — Encounter: Payer: Medicare Other | Admitting: Internal Medicine

## 2012-10-04 ENCOUNTER — Ambulatory Visit: Payer: Medicare Other | Admitting: Family

## 2012-10-05 ENCOUNTER — Other Ambulatory Visit: Payer: Self-pay | Admitting: Cardiology

## 2012-10-13 ENCOUNTER — Ambulatory Visit: Payer: Medicare Other | Admitting: Neurosurgery

## 2012-10-17 ENCOUNTER — Telehealth: Payer: Self-pay | Admitting: Internal Medicine

## 2012-10-17 MED ORDER — SIMVASTATIN 20 MG PO TABS: 20.0000 mg | ORAL_TABLET | Freq: Every day | ORAL | Status: DC

## 2012-10-17 NOTE — Telephone Encounter (Signed)
Refill-simvastatin 20 mg tablet. Take one tablet by mouth at bedtime. Qty 90 last fill 2.10.14

## 2012-10-25 ENCOUNTER — Ambulatory Visit (INDEPENDENT_AMBULATORY_CARE_PROVIDER_SITE_OTHER): Payer: Medicare Other | Admitting: *Deleted

## 2012-10-25 DIAGNOSIS — Z8679 Personal history of other diseases of the circulatory system: Secondary | ICD-10-CM

## 2012-10-25 DIAGNOSIS — Z954 Presence of other heart-valve replacement: Secondary | ICD-10-CM

## 2012-10-25 DIAGNOSIS — I359 Nonrheumatic aortic valve disorder, unspecified: Secondary | ICD-10-CM

## 2012-10-25 DIAGNOSIS — I4891 Unspecified atrial fibrillation: Secondary | ICD-10-CM

## 2012-10-25 LAB — POCT INR: INR: 3

## 2012-11-01 ENCOUNTER — Other Ambulatory Visit: Payer: Self-pay | Admitting: Cardiology

## 2012-11-04 ENCOUNTER — Other Ambulatory Visit: Payer: Self-pay | Admitting: Cardiology

## 2012-11-07 ENCOUNTER — Other Ambulatory Visit: Payer: Self-pay | Admitting: Emergency Medicine

## 2012-11-07 ENCOUNTER — Other Ambulatory Visit: Payer: Self-pay | Admitting: Cardiology

## 2012-11-07 MED ORDER — FINASTERIDE 5 MG PO TABS: 5.0000 mg | ORAL_TABLET | Freq: Every day | ORAL | Status: DC

## 2012-11-08 ENCOUNTER — Other Ambulatory Visit: Payer: Self-pay | Admitting: *Deleted

## 2012-11-08 DIAGNOSIS — I714 Abdominal aortic aneurysm, without rupture: Secondary | ICD-10-CM

## 2012-11-08 DIAGNOSIS — Z48812 Encounter for surgical aftercare following surgery on the circulatory system: Secondary | ICD-10-CM

## 2012-11-14 ENCOUNTER — Other Ambulatory Visit: Payer: Self-pay | Admitting: Family

## 2012-11-14 ENCOUNTER — Other Ambulatory Visit: Payer: Self-pay | Admitting: Cardiology

## 2012-11-16 ENCOUNTER — Encounter (INDEPENDENT_AMBULATORY_CARE_PROVIDER_SITE_OTHER): Payer: Medicare Other | Admitting: *Deleted

## 2012-11-16 DIAGNOSIS — Z48812 Encounter for surgical aftercare following surgery on the circulatory system: Secondary | ICD-10-CM

## 2012-11-16 DIAGNOSIS — I714 Abdominal aortic aneurysm, without rupture: Secondary | ICD-10-CM

## 2012-11-17 ENCOUNTER — Other Ambulatory Visit: Payer: Self-pay | Admitting: *Deleted

## 2012-11-23 ENCOUNTER — Other Ambulatory Visit: Payer: Self-pay

## 2012-11-23 DIAGNOSIS — I714 Abdominal aortic aneurysm, without rupture: Secondary | ICD-10-CM

## 2012-11-23 DIAGNOSIS — Z48812 Encounter for surgical aftercare following surgery on the circulatory system: Secondary | ICD-10-CM

## 2012-11-25 ENCOUNTER — Encounter: Payer: Self-pay | Admitting: Vascular Surgery

## 2012-11-30 ENCOUNTER — Ambulatory Visit (INDEPENDENT_AMBULATORY_CARE_PROVIDER_SITE_OTHER): Payer: Medicare Other | Admitting: *Deleted

## 2012-11-30 DIAGNOSIS — Z8679 Personal history of other diseases of the circulatory system: Secondary | ICD-10-CM

## 2012-11-30 DIAGNOSIS — I359 Nonrheumatic aortic valve disorder, unspecified: Secondary | ICD-10-CM

## 2012-11-30 DIAGNOSIS — I4891 Unspecified atrial fibrillation: Secondary | ICD-10-CM

## 2012-11-30 DIAGNOSIS — Z954 Presence of other heart-valve replacement: Secondary | ICD-10-CM

## 2012-12-12 ENCOUNTER — Other Ambulatory Visit: Payer: Self-pay | Admitting: Cardiology

## 2012-12-12 ENCOUNTER — Telehealth: Payer: Self-pay | Admitting: Internal Medicine

## 2012-12-12 NOTE — Telephone Encounter (Signed)
Spoke with daughter, and she is going to follow up with [place] after Urology appt today.

## 2012-12-12 NOTE — Telephone Encounter (Signed)
Returned call to patient's daughter [name] she stated her father has been passing bright red blood when he urinates.Stated just started this morning and has urinated twice with large amounts of bright red blood in urine.Stated he has appointment with urologist at 3:00 pm this afternoon.Patient takes coumadin.Message sent to coumadin.

## 2012-12-12 NOTE — Telephone Encounter (Signed)
New Problem  Pt's daughter states he is having some blood in his urine.  She said that she has called his kidney doctor and he has an appt with him this afternoon. But she wanted to speak with this office as well.

## 2012-12-13 ENCOUNTER — Telehealth: Payer: Self-pay | Admitting: Internal Medicine

## 2012-12-13 NOTE — Telephone Encounter (Deleted)
Spoke with Selena Batten from Urology center, and let her know that according to Dr. Lubertha Basque office visit on 4/

## 2012-12-13 NOTE — Telephone Encounter (Signed)
Spoke with Selena Batten from Urology center. She is aware that according to  Dr. Ladona Ridgel office note on 09/30/12, pt has no allergies. Kim verbalized understanding.

## 2012-12-13 NOTE — Telephone Encounter (Signed)
New problem   kim/Medical center urology need to know what pt is allergic to b/c he is need to have a test done w/dye

## 2012-12-13 NOTE — Telephone Encounter (Signed)
Spoke with pts daughter and pt was started on Septra DS BID for 7 days on 12/12/12. He is also scheduled for CT with/without Contrast and Cystoscopy on 12/20/12 with Dr Gerome Sam in [phone]). Appt made for 12/15/12 for INR check.

## 2012-12-15 ENCOUNTER — Ambulatory Visit (INDEPENDENT_AMBULATORY_CARE_PROVIDER_SITE_OTHER): Payer: Medicare Other

## 2012-12-15 DIAGNOSIS — I359 Nonrheumatic aortic valve disorder, unspecified: Secondary | ICD-10-CM

## 2012-12-15 DIAGNOSIS — Z8679 Personal history of other diseases of the circulatory system: Secondary | ICD-10-CM

## 2012-12-15 DIAGNOSIS — I4891 Unspecified atrial fibrillation: Secondary | ICD-10-CM

## 2012-12-15 DIAGNOSIS — Z954 Presence of other heart-valve replacement: Secondary | ICD-10-CM

## 2012-12-15 LAB — POCT INR: INR: 4.3

## 2012-12-21 ENCOUNTER — Ambulatory Visit (INDEPENDENT_AMBULATORY_CARE_PROVIDER_SITE_OTHER): Payer: Medicare Other | Admitting: *Deleted

## 2012-12-21 DIAGNOSIS — I4891 Unspecified atrial fibrillation: Secondary | ICD-10-CM

## 2012-12-21 DIAGNOSIS — I359 Nonrheumatic aortic valve disorder, unspecified: Secondary | ICD-10-CM

## 2012-12-21 DIAGNOSIS — Z8679 Personal history of other diseases of the circulatory system: Secondary | ICD-10-CM

## 2012-12-21 DIAGNOSIS — Z954 Presence of other heart-valve replacement: Secondary | ICD-10-CM

## 2012-12-26 DIAGNOSIS — I495 Sick sinus syndrome: Secondary | ICD-10-CM

## 2013-01-02 ENCOUNTER — Other Ambulatory Visit: Payer: Self-pay | Admitting: Cardiology

## 2013-01-05 ENCOUNTER — Telehealth: Payer: Self-pay | Admitting: *Deleted

## 2013-01-05 MED ORDER — LORAZEPAM 1 MG PO TABS: ORAL_TABLET | ORAL | Status: DC

## 2013-01-05 NOTE — Telephone Encounter (Signed)
Ok to send 30 tabs with zero refills.  

## 2013-01-05 NOTE — Telephone Encounter (Signed)
Received refill request from [facility] for lorazepam. Rx last sent 05/18/12, #30 x 2 refills.  Please advise.

## 2013-01-05 NOTE — Telephone Encounter (Signed)
Rx called to pharmacy voicemail. 

## 2013-01-11 ENCOUNTER — Ambulatory Visit (INDEPENDENT_AMBULATORY_CARE_PROVIDER_SITE_OTHER): Payer: Medicare Other | Admitting: *Deleted

## 2013-01-11 DIAGNOSIS — I4891 Unspecified atrial fibrillation: Secondary | ICD-10-CM

## 2013-01-11 DIAGNOSIS — Z8679 Personal history of other diseases of the circulatory system: Secondary | ICD-10-CM

## 2013-01-11 DIAGNOSIS — Z954 Presence of other heart-valve replacement: Secondary | ICD-10-CM

## 2013-01-11 DIAGNOSIS — I359 Nonrheumatic aortic valve disorder, unspecified: Secondary | ICD-10-CM

## 2013-01-25 ENCOUNTER — Telehealth: Payer: Self-pay | Admitting: *Deleted

## 2013-01-25 ENCOUNTER — Ambulatory Visit (INDEPENDENT_AMBULATORY_CARE_PROVIDER_SITE_OTHER): Payer: Medicare Other | Admitting: *Deleted

## 2013-01-25 DIAGNOSIS — I4891 Unspecified atrial fibrillation: Secondary | ICD-10-CM

## 2013-01-25 DIAGNOSIS — I359 Nonrheumatic aortic valve disorder, unspecified: Secondary | ICD-10-CM

## 2013-01-25 DIAGNOSIS — Z8679 Personal history of other diseases of the circulatory system: Secondary | ICD-10-CM

## 2013-01-25 DIAGNOSIS — Z954 Presence of other heart-valve replacement: Secondary | ICD-10-CM

## 2013-01-25 NOTE — Telephone Encounter (Signed)
Received message from pt's daughter requesting refill of pt's tramadol. States he was prescribed that for a rib fracture in the past. Recently feels like he has a "crick" in his neck and took tramadol which seemed to help.  Please advise.

## 2013-01-25 NOTE — Telephone Encounter (Signed)
Ok to send tramadol 50mg  PO Q8 hrs prn #30 no refills.

## 2013-01-26 ENCOUNTER — Telehealth: Payer: Self-pay | Admitting: *Deleted

## 2013-01-26 MED ORDER — TRAMADOL HCL 50 MG PO TABS: 50.0000 mg | ORAL_TABLET | Freq: Three times a day (TID) | ORAL | Status: DC | PRN

## 2013-01-26 NOTE — Telephone Encounter (Signed)
Patient informed, understood & agreed; will have daughter to call back & make f/u appointment/SLS

## 2013-01-26 NOTE — Telephone Encounter (Signed)
Past due for follow up.  Needs OV prior to refill please.

## 2013-01-26 NOTE — Telephone Encounter (Signed)
Faxed refill request received from pharmacy for Tramadol Last filled by MD on 01.16.14, #[id number] Last AEX - 03.27.14 Next AEX - 1 Month, no appt scheduled Please Advise/SLS

## 2013-01-26 NOTE — Telephone Encounter (Signed)
Refill called to [facility]. Notified pt.

## 2013-01-30 ENCOUNTER — Other Ambulatory Visit: Payer: Self-pay | Admitting: Cardiology

## 2013-02-06 ENCOUNTER — Other Ambulatory Visit: Payer: Self-pay | Admitting: Cardiology

## 2013-02-14 ENCOUNTER — Ambulatory Visit (INDEPENDENT_AMBULATORY_CARE_PROVIDER_SITE_OTHER): Payer: Medicare Other | Admitting: Pharmacist

## 2013-02-14 DIAGNOSIS — Z954 Presence of other heart-valve replacement: Secondary | ICD-10-CM

## 2013-02-14 DIAGNOSIS — I359 Nonrheumatic aortic valve disorder, unspecified: Secondary | ICD-10-CM

## 2013-02-14 DIAGNOSIS — Z8679 Personal history of other diseases of the circulatory system: Secondary | ICD-10-CM

## 2013-02-14 DIAGNOSIS — I4891 Unspecified atrial fibrillation: Secondary | ICD-10-CM

## 2013-02-14 LAB — POCT INR: INR: 3.7

## 2013-02-27 ENCOUNTER — Other Ambulatory Visit: Payer: Self-pay | Admitting: Family

## 2013-02-27 ENCOUNTER — Other Ambulatory Visit: Payer: Self-pay | Admitting: Cardiology

## 2013-02-28 ENCOUNTER — Ambulatory Visit (INDEPENDENT_AMBULATORY_CARE_PROVIDER_SITE_OTHER): Payer: Medicare Other

## 2013-02-28 DIAGNOSIS — I359 Nonrheumatic aortic valve disorder, unspecified: Secondary | ICD-10-CM

## 2013-02-28 DIAGNOSIS — I4891 Unspecified atrial fibrillation: Secondary | ICD-10-CM

## 2013-02-28 DIAGNOSIS — Z8679 Personal history of other diseases of the circulatory system: Secondary | ICD-10-CM

## 2013-02-28 DIAGNOSIS — Z954 Presence of other heart-valve replacement: Secondary | ICD-10-CM

## 2013-02-28 LAB — POCT INR: INR: 4.9

## 2013-02-28 NOTE — Telephone Encounter (Signed)
OK to send #30 no refills.  Due for visit please.

## 2013-02-28 NOTE — Telephone Encounter (Signed)
Pt last seen 09/01/12 and has no future appts on file.  Lorazepam last filled 01/05/13, #30.  Please advise.

## 2013-03-01 NOTE — Telephone Encounter (Signed)
Rx called to pharmacy voicemail. Please call pt to arrange appt as instructed below.

## 2013-03-02 NOTE — Telephone Encounter (Signed)
Informed patient of medication refill. He states that he no longer drives and will have to talk to his daughter to get transportation. He will call back to schedule an appointment.

## 2013-03-02 NOTE — Telephone Encounter (Signed)
Received message from pt's daughter stating pharmacy still hasn't received our Lorazepam refill. Spoke with pharmacy and she states they did not receive our previous voicemail. Gave verbal authorization per 02/27/13 authorization and notified pt that refill has been completed.

## 2013-03-13 ENCOUNTER — Other Ambulatory Visit: Payer: Self-pay | Admitting: Cardiology

## 2013-03-14 ENCOUNTER — Ambulatory Visit (INDEPENDENT_AMBULATORY_CARE_PROVIDER_SITE_OTHER): Payer: Medicare Other | Admitting: General Practice

## 2013-03-14 DIAGNOSIS — Z954 Presence of other heart-valve replacement: Secondary | ICD-10-CM

## 2013-03-14 DIAGNOSIS — I4891 Unspecified atrial fibrillation: Secondary | ICD-10-CM

## 2013-03-14 DIAGNOSIS — Z8679 Personal history of other diseases of the circulatory system: Secondary | ICD-10-CM

## 2013-03-14 DIAGNOSIS — I359 Nonrheumatic aortic valve disorder, unspecified: Secondary | ICD-10-CM

## 2013-03-27 ENCOUNTER — Other Ambulatory Visit: Payer: Self-pay | Admitting: Cardiology

## 2013-03-28 ENCOUNTER — Encounter: Payer: Self-pay | Admitting: Internal Medicine

## 2013-03-28 DIAGNOSIS — I495 Sick sinus syndrome: Secondary | ICD-10-CM

## 2013-03-29 ENCOUNTER — Encounter: Payer: Self-pay | Admitting: Family

## 2013-03-29 ENCOUNTER — Ambulatory Visit (INDEPENDENT_AMBULATORY_CARE_PROVIDER_SITE_OTHER): Payer: Medicare Other | Admitting: Family

## 2013-03-29 VITALS — BP 130/60 | HR 67 | Temp 98.1°F | Resp 16 | Ht 67.0 in | Wt 170.0 lb

## 2013-03-29 DIAGNOSIS — R251 Tremor, unspecified: Secondary | ICD-10-CM

## 2013-03-29 DIAGNOSIS — I4891 Unspecified atrial fibrillation: Secondary | ICD-10-CM

## 2013-03-29 DIAGNOSIS — M109 Gout, unspecified: Secondary | ICD-10-CM

## 2013-03-29 DIAGNOSIS — K219 Gastro-esophageal reflux disease without esophagitis: Secondary | ICD-10-CM

## 2013-03-29 DIAGNOSIS — R259 Unspecified abnormal involuntary movements: Secondary | ICD-10-CM

## 2013-03-29 DIAGNOSIS — N400 Enlarged prostate without lower urinary tract symptoms: Secondary | ICD-10-CM

## 2013-03-29 DIAGNOSIS — Z5181 Encounter for therapeutic drug level monitoring: Secondary | ICD-10-CM

## 2013-03-29 DIAGNOSIS — E785 Hyperlipidemia, unspecified: Secondary | ICD-10-CM

## 2013-03-29 DIAGNOSIS — I10 Essential (primary) hypertension: Secondary | ICD-10-CM

## 2013-03-29 DIAGNOSIS — H3530 Unspecified macular degeneration: Secondary | ICD-10-CM

## 2013-03-29 DIAGNOSIS — Z23 Encounter for immunization: Secondary | ICD-10-CM

## 2013-03-29 LAB — HEPATIC FUNCTION PANEL
AST: 27 U/L (ref 0–37)
Albumin: 3.7 g/dL (ref 3.5–5.2)
Alkaline Phosphatase: 83 U/L (ref 39–117)
Bilirubin, Direct: 0.2 mg/dL (ref 0.0–0.3)
Total Bilirubin: 0.8 mg/dL (ref 0.3–1.2)
Total Protein: 6.3 g/dL (ref 6.0–8.3)

## 2013-03-29 LAB — LIPID PANEL
HDL: 31 mg/dL — ABNORMAL LOW (ref >39)
Total CHOL/HDL Ratio: 3.2 Ratio
Triglycerides: 119 mg/dL (ref <150)
VLDL: 24 mg/dL (ref 0–40)

## 2013-03-29 LAB — BASIC METABOLIC PANEL
BUN: 16 mg/dL (ref 6–23)
CO2: 30 mEq/L (ref 19–32)
Calcium: 9 mg/dL (ref 8.4–10.5)
Chloride: 102 mEq/L (ref 96–112)
Creat: 0.94 mg/dL (ref 0.50–1.35)
Glucose, Bld: 122 mg/dL — ABNORMAL HIGH (ref 70–99)

## 2013-03-29 NOTE — Progress Notes (Signed)
Subjective:    Patient ID: [name], male    DOB: Apr 09, 1928, 79 y.o.   MRN: [id number]  HPI  [name] is an 79 yr old male with multiple medical problems who presents today for follow up.  1) HTN- continues amlodipine, carvedilol.    2) anxiety- uses ativan prn. He uses 1/2 tab at night to help him sleep.   3) Hyperlipidemia- on simvastatin.   4) CAD/AF- on coreg, dig, coumadin, amlodipine. Follows with [name]. Denies chest pain.    5) Gout- maintained on colchicine. Denies recent gout flare.  6) GERD- maintained on prilosec. Reports well controlled.   7) BPH- maintained on rapaflo.    9) Macular degeneration-Family reports that he has trouble seeing.  Daughter makes sure he has fresh food.    10) Tremor- declines referral to neurology.   Review of Systems    see HPI  Past Medical History  Diagnosis Date  . CAD (coronary artery disease)   . COPD (chronic obstructive pulmonary disease)   . C. difficile colitis     recurrent diarrhea  . Hyperlipidemia   . HTN (hypertension)   . Osteoarthritis   . BPH (benign prostatic hypertrophy)   . Low back pain   . Lumbar disc disease   . CHF (congestive heart failure)   . Permanent atrial fibrillation   . Colonic polyp   . Diverticulosis of colon   . Anxiety   . GERD (gastroesophageal reflux disease)   . PVD (peripheral vascular disease)   . TIA (transient ischemic attack)   . Diarrhea     recurrent diarrhea  . Tachy-brady syndrome   . Abdominal aneurysm     History   Social History  . Marital Status: Widowed    Spouse Name: N/A    Number of Children: N/A  . Years of Education: N/A   Occupational History  . Not on file.   Social History Main Topics  . Smoking status: Former Games developer  . Smokeless tobacco: Current User    Types: Chew  . Alcohol Use: No  . Drug Use: Not on file  . Sexual Activity: Not on file   Other Topics Concern  . Not on file   Social History Narrative   Former Smoker     Alcohol use-no   Widowed                Past Surgical History  Procedure Laterality Date  . Coronary artery bypass graft  1993  . Pacemaker placement  1992 and 2005    permanent and new placement 2005  . Abdominal aortic aneurysm repair      with intraluminal graft  . Aortic valve replacement      s/p   . Retinal detachment surgery      repair of right eye 9/211- Dr Luciana Axe    Family History  Problem Relation Age of Onset  . Hypertension Mother   . Stroke Father     died of stroke  . Stroke Sister     died of stroke  . Cancer Other     wife diagnosed with advanced bladder cancer-deceased    No Known Allergies  Current Outpatient Prescriptions on File Prior to Visit  Medication Sig Dispense Refill  . amLODipine (NORVASC) 5 MG tablet TAKE ONE TABLET BY MOUTH ONE TIME DAILY  90 tablet  1  . carvedilol (COREG) 25 MG tablet TAKE 1 TABLET BY MOUTH TWICE DAILY  60 tablet  0  .  colchicine 0.6 MG tablet One po bid prn gout      . DIGOX 125 MCG tablet TAKE 1 TABLET BY MOUTH EVERY DAY  30 tablet  0  . finasteride (PROSCAR) 5 MG tablet Take 1 tablet (5 mg total) by mouth daily.  30 tablet  5  . LORazepam (ATIVAN) 1 MG tablet TAKE 1/2 TABLET BY MOUTH ONCE DAILY AS NEEDED  30 tablet  0  . losartan (COZAAR) 50 MG tablet TAKE 1 TABLET BY MOUTH ONCE DAILY  30 tablet  5  . nystatin ointment (MYCOSTATIN) Apply topically 2 (two) times daily. Apply twice daily to buttocks/rash until healed  30 g  1  . omeprazole (PRILOSEC) 20 MG capsule Take 20 mg by mouth daily. For reflux       . Silodosin (RAPAFLO PO) Take 8 mg by mouth daily. To help with urinary flow      . simvastatin (ZOCOR) 20 MG tablet Take 1 tablet (20 mg total) by mouth at bedtime.  90 tablet  1  . traMADol (ULTRAM) 50 MG tablet Take 1 tablet (50 mg total) by mouth every 8 (eight) hours as needed for pain.  30 tablet  0  . warfarin (COUMADIN) 5 MG tablet USE AS DIRECTED BY ANTICOAGULATION CLINIC.  40 tablet  3   No current  facility-administered medications on file prior to visit.    BP 130/60  Pulse 67  Temp(Src) 98.1 F (36.7 C) (Oral)  Resp 16  Ht 5\' 7"  (1.702 m)  Wt 170 lb (77.111 kg)  BMI 26.62 kg/m2  SpO2 97%    Objective:   Physical Exam  Constitutional: He is oriented to person, place, and time. He appears well-developed and well-nourished. No distress.  HENT:  Head: Normocephalic and atraumatic.  Cardiovascular: Normal rate and regular rhythm.   No murmur heard. Pulmonary/Chest: Effort normal and breath sounds normal. No respiratory distress. He has no wheezes. He has no rales. He exhibits no tenderness.  Musculoskeletal: He exhibits no edema.  Neurological: He is alert and oriented to person, place, and time.  Mild head tremor, significant voice tremor.  Psychiatric: He has a normal mood and affect. His behavior is normal. Judgment and thought content normal.          Assessment & Plan:

## 2013-03-29 NOTE — Patient Instructions (Signed)
Please complete lab work prior to leaving. Follow up in 4 months.  

## 2013-03-30 DIAGNOSIS — R251 Tremor, unspecified: Secondary | ICD-10-CM | POA: Insufficient documentation

## 2013-03-30 LAB — DIGOXIN LEVEL: Digoxin Level: 1.2 ng/mL (ref 0.8–2.0)

## 2013-03-30 NOTE — Assessment & Plan Note (Signed)
Maintained on amlodipine/carvedilol.  BP stable, continue current meds.   BP Readings from Last 3 Encounters:  03/29/13 130/60  09/30/12 116/58  09/01/12 140/64

## 2013-03-30 NOTE — Assessment & Plan Note (Signed)
Clinically stable on current meds.  Coumadin is managed by coumadin clinic.

## 2013-03-30 NOTE — Assessment & Plan Note (Signed)
Stable on prn colchicine.

## 2013-03-30 NOTE — Assessment & Plan Note (Signed)
Pt maintained on statin.  Overdue for lipid panel. Non-fasting today. Check non-fasting lipid panel.  Check lft.

## 2013-03-30 NOTE — Assessment & Plan Note (Signed)
Daughter notes "pill rolling".  Suggested referral to neuro for parkinson's eval.  Pt declines.

## 2013-03-30 NOTE — Assessment & Plan Note (Signed)
Management per Dr. Tamsen Meek.

## 2013-03-30 NOTE — Assessment & Plan Note (Signed)
Stable on rapaflo. Continue same.

## 2013-03-30 NOTE — Assessment & Plan Note (Signed)
Stable on PPI, continue same.  

## 2013-03-31 ENCOUNTER — Ambulatory Visit: Payer: Medicare Other | Admitting: Family

## 2013-03-31 ENCOUNTER — Encounter: Payer: Self-pay | Admitting: Family

## 2013-04-03 ENCOUNTER — Other Ambulatory Visit: Payer: Self-pay | Admitting: Internal Medicine

## 2013-04-03 ENCOUNTER — Other Ambulatory Visit: Payer: Self-pay | Admitting: Cardiology

## 2013-04-04 ENCOUNTER — Ambulatory Visit (INDEPENDENT_AMBULATORY_CARE_PROVIDER_SITE_OTHER): Payer: Medicare Other | Admitting: Pharmacist

## 2013-04-04 DIAGNOSIS — I359 Nonrheumatic aortic valve disorder, unspecified: Secondary | ICD-10-CM

## 2013-04-04 DIAGNOSIS — I4891 Unspecified atrial fibrillation: Secondary | ICD-10-CM

## 2013-04-04 DIAGNOSIS — Z954 Presence of other heart-valve replacement: Secondary | ICD-10-CM

## 2013-04-04 DIAGNOSIS — Z8679 Personal history of other diseases of the circulatory system: Secondary | ICD-10-CM

## 2013-04-04 LAB — POCT INR: INR: 2.7

## 2013-04-06 ENCOUNTER — Other Ambulatory Visit: Payer: Self-pay | Admitting: Cardiology

## 2013-04-18 ENCOUNTER — Other Ambulatory Visit: Payer: Self-pay | Admitting: Family Medicine

## 2013-04-25 ENCOUNTER — Ambulatory Visit (INDEPENDENT_AMBULATORY_CARE_PROVIDER_SITE_OTHER): Payer: Medicare Other | Admitting: *Deleted

## 2013-04-25 DIAGNOSIS — I359 Nonrheumatic aortic valve disorder, unspecified: Secondary | ICD-10-CM

## 2013-04-25 DIAGNOSIS — Z954 Presence of other heart-valve replacement: Secondary | ICD-10-CM

## 2013-04-25 DIAGNOSIS — Z8679 Personal history of other diseases of the circulatory system: Secondary | ICD-10-CM

## 2013-04-25 DIAGNOSIS — I4891 Unspecified atrial fibrillation: Secondary | ICD-10-CM

## 2013-04-25 LAB — POCT INR: INR: 2.2

## 2013-05-01 ENCOUNTER — Other Ambulatory Visit: Payer: Self-pay | Admitting: Cardiology

## 2013-05-08 ENCOUNTER — Encounter: Payer: Self-pay | Admitting: Internal Medicine

## 2013-05-10 ENCOUNTER — Other Ambulatory Visit: Payer: Self-pay | Admitting: *Deleted

## 2013-05-10 MED ORDER — FINASTERIDE 5 MG PO TABS: 5.0000 mg | ORAL_TABLET | Freq: Every day | ORAL | Status: DC

## 2013-05-16 ENCOUNTER — Ambulatory Visit (INDEPENDENT_AMBULATORY_CARE_PROVIDER_SITE_OTHER): Payer: Medicare Other | Admitting: *Deleted

## 2013-05-16 DIAGNOSIS — I4891 Unspecified atrial fibrillation: Secondary | ICD-10-CM

## 2013-05-16 DIAGNOSIS — Z954 Presence of other heart-valve replacement: Secondary | ICD-10-CM

## 2013-05-16 DIAGNOSIS — Z8679 Personal history of other diseases of the circulatory system: Secondary | ICD-10-CM

## 2013-05-16 DIAGNOSIS — I359 Nonrheumatic aortic valve disorder, unspecified: Secondary | ICD-10-CM

## 2013-05-25 ENCOUNTER — Other Ambulatory Visit: Payer: Self-pay | Admitting: Family

## 2013-05-26 NOTE — Telephone Encounter (Signed)
Rx request to pharmacy/SLS  

## 2013-05-29 ENCOUNTER — Ambulatory Visit (INDEPENDENT_AMBULATORY_CARE_PROVIDER_SITE_OTHER): Payer: Medicare Other | Admitting: Family

## 2013-05-29 ENCOUNTER — Other Ambulatory Visit: Payer: Self-pay | Admitting: Cardiology

## 2013-05-29 ENCOUNTER — Ambulatory Visit: Payer: Medicare Other | Admitting: Family

## 2013-05-29 ENCOUNTER — Encounter: Payer: Self-pay | Admitting: Family

## 2013-05-29 VITALS — BP 130/60 | HR 48 | Temp 98.3°F | Resp 16 | Ht 67.0 in | Wt 178.0 lb

## 2013-05-29 DIAGNOSIS — R001 Bradycardia, unspecified: Secondary | ICD-10-CM

## 2013-05-29 DIAGNOSIS — R739 Hyperglycemia, unspecified: Secondary | ICD-10-CM

## 2013-05-29 DIAGNOSIS — I509 Heart failure, unspecified: Secondary | ICD-10-CM

## 2013-05-29 DIAGNOSIS — I498 Other specified cardiac arrhythmias: Secondary | ICD-10-CM

## 2013-05-29 DIAGNOSIS — R82998 Other abnormal findings in urine: Secondary | ICD-10-CM

## 2013-05-29 DIAGNOSIS — R7309 Other abnormal glucose: Secondary | ICD-10-CM

## 2013-05-29 DIAGNOSIS — R8290 Unspecified abnormal findings in urine: Secondary | ICD-10-CM

## 2013-05-29 MED ORDER — ALBUTEROL SULFATE (2.5 MG/3ML) 0.083% IN NEBU: 2.5000 mg | INHALATION_SOLUTION | Freq: Four times a day (QID) | RESPIRATORY_TRACT | Status: DC | PRN

## 2013-05-29 MED ORDER — FUROSEMIDE 20 MG PO TABS: 40.0000 mg | ORAL_TABLET | Freq: Every day | ORAL | Status: DC

## 2013-05-29 MED ORDER — LEVOFLOXACIN 250 MG PO TABS: 250.0000 mg | ORAL_TABLET | Freq: Every day | ORAL | Status: DC

## 2013-05-29 NOTE — Patient Instructions (Signed)
Increase furosemide to 40mg  once daily (take 20 mg as soon as you get home) Then tomorrow AM, start 40mg  once daily.  Follow up Wednesday AM. Call if symptoms worsen. You will be contacted about your home health RN and nebulizer.

## 2013-05-29 NOTE — Progress Notes (Signed)
Subjective:    Patient ID: [name], male    DOB: 18-Aug-1927, 79 y.o.   MRN: [id number]  HPI  [name] is an 79 yr old male who presents today for hospital follow up.  Lab work from his visit is available for review. We have not yet received the visit note from the ED.  Lab work reveals a mild anemia (hbg 11.9.  INR was 3.17 and glucose was elevated at 199.  BNP was elevated at 2060.  Dig level was therapeutic at 1.4.  Urinalysis noted trace leukocytes.    Daughter reports that she brought him to [facility] due to extreme shortness of breath and weakness. He was given a breathing treatment. Per daughter x ray did not show pneumonia.  He apparently responded well to breathing treatment. Now shortness of breath has worsened again.    His discharge instructions noted dx of bronchitis.  He was given rx for levaquin 250 mg once daily for 5 days.  Daughter notes sputum green.      Review of Systems    see HPI  Past Medical History  Diagnosis Date  . CAD (coronary artery disease)   . COPD (chronic obstructive pulmonary disease)   . C. difficile colitis     recurrent diarrhea  . Hyperlipidemia   . HTN (hypertension)   . Osteoarthritis   . BPH (benign prostatic hypertrophy)   . Low back pain   . Lumbar disc disease   . CHF (congestive heart failure)   . Permanent atrial fibrillation   . Colonic polyp   . Diverticulosis of colon   . Anxiety   . GERD (gastroesophageal reflux disease)   . PVD (peripheral vascular disease)   . TIA (transient ischemic attack)   . Diarrhea     recurrent diarrhea  . Tachy-brady syndrome   . Abdominal aneurysm     History   Social History  . Marital Status: Widowed    Spouse Name: N/A    Number of Children: N/A  . Years of Education: N/A   Occupational History  . Not on file.   Social History Main Topics  . Smoking status: Former Games developer  . Smokeless tobacco: Current User    Types: Chew  . Alcohol Use: No  . Drug Use: Not on file    . Sexual Activity: Not on file   Other Topics Concern  . Not on file   Social History Narrative   Former Smoker     Alcohol use-no   Widowed                Past Surgical History  Procedure Laterality Date  . Coronary artery bypass graft  1993  . Pacemaker placement  1992 and 2005    permanent and new placement 2005  . Abdominal aortic aneurysm repair      with intraluminal graft  . Aortic valve replacement      s/p   . Retinal detachment surgery      repair of right eye 9/211- Dr Luciana Axe    Family History  Problem Relation Age of Onset  . Hypertension Mother   . Stroke Father     died of stroke  . Stroke Sister     died of stroke  . Cancer Other     wife diagnosed with advanced bladder cancer-deceased    No Known Allergies  Current Outpatient Prescriptions on File Prior to Visit  Medication Sig Dispense Refill  . amLODipine (NORVASC) 5  MG tablet TAKE 1 TABLET BY MOUTH ONCE DAILY  90 tablet  0  . carvedilol (COREG) 25 MG tablet TAKE 1 TABLET BY MOUTH TWICE DAILY  60 tablet  3  . colchicine 0.6 MG tablet One po bid prn gout      . DIGOX 125 MCG tablet TAKE 1 TABLET BY MOUTH EVERY DAY  30 tablet  0  . finasteride (PROSCAR) 5 MG tablet Take 1 tablet (5 mg total) by mouth daily.  30 tablet  5  . LORazepam (ATIVAN) 1 MG tablet TAKE 1/2 TABLET BY MOUTH ONCE DAILY AS NEEDED  30 tablet  0  . losartan (COZAAR) 50 MG tablet TAKE 1 TABLET BY MOUTH EVERY DAY  30 tablet  0  . nystatin ointment (MYCOSTATIN) Apply topically 2 (two) times daily. Apply twice daily to buttocks/rash until healed  30 g  1  . omeprazole (PRILOSEC) 20 MG capsule Take 20 mg by mouth daily. For reflux       . Silodosin (RAPAFLO PO) Take 8 mg by mouth daily. To help with urinary flow      . simvastatin (ZOCOR) 20 MG tablet TAKE ONE TABLET BY MOUTH AT BEDTIME  90 tablet  1  . traMADol (ULTRAM) 50 MG tablet Take 1 tablet (50 mg total) by mouth every 8 (eight) hours as needed for pain.  30 tablet  0  .  warfarin (COUMADIN) 5 MG tablet USE AS DIRECTED BY ANTICOAGULATION CLINIC.  40 tablet  3   No current facility-administered medications on file prior to visit.    BP 130/60  Pulse 48  Temp(Src) 98.3 F (36.8 C) (Oral)  Resp 16  Ht 5\' 7"  (1.702 m)  Wt 178 lb (80.74 kg)  BMI 27.87 kg/m2  SpO2 97%    Objective:   Physical Exam  Constitutional: He appears well-developed and well-nourished. No distress.  Tired appearing white male. NAD  Cardiovascular:  Irregular rate.    Pulmonary/Chest: Effort normal and breath sounds normal.  Diminished breath sounds at the bases.   Musculoskeletal:  2+ bilateral LE edema.   Neurological: He is alert.  Psychiatric: He has a normal mood and affect. His behavior is normal. Judgment and thought content normal.          Assessment & Plan:

## 2013-05-29 NOTE — Progress Notes (Signed)
Pre visit review using our clinic review tool, if applicable. No additional management support is needed unless otherwise documented below in the visit note. 

## 2013-05-29 NOTE — Assessment & Plan Note (Addendum)
BNP >2000.  His weight is up 8 pounds since October.  Will increase lasix to 40mg  once daily. He is maintained on coumadin and also continues rx of levaquin from the hospital.  Daughter will take him tomorrow for PT/INR tomorrow.  EKG performed today in the office is compared to the EKG from 10/03/12 and appears essentially unchanged.   Wt Readings from Last 3 Encounters:  05/29/13 178 lb (80.74 kg)  03/29/13 170 lb (77.111 kg)  09/30/12 178 lb 9.6 oz (81.012 kg)

## 2013-05-29 NOTE — Addendum Note (Signed)
Addended by: Mervin Kung A on: 05/29/2013 05:45 PM   Modules accepted: Orders

## 2013-05-30 ENCOUNTER — Ambulatory Visit (INDEPENDENT_AMBULATORY_CARE_PROVIDER_SITE_OTHER): Payer: Medicare Other | Admitting: *Deleted

## 2013-05-30 DIAGNOSIS — Z954 Presence of other heart-valve replacement: Secondary | ICD-10-CM

## 2013-05-30 DIAGNOSIS — I359 Nonrheumatic aortic valve disorder, unspecified: Secondary | ICD-10-CM

## 2013-05-30 DIAGNOSIS — Z8679 Personal history of other diseases of the circulatory system: Secondary | ICD-10-CM

## 2013-05-30 DIAGNOSIS — I4891 Unspecified atrial fibrillation: Secondary | ICD-10-CM

## 2013-05-30 LAB — POCT INR: INR: 5.9

## 2013-05-31 ENCOUNTER — Ambulatory Visit (INDEPENDENT_AMBULATORY_CARE_PROVIDER_SITE_OTHER): Payer: Medicare Other | Admitting: Family

## 2013-05-31 ENCOUNTER — Encounter: Payer: Self-pay | Admitting: Family

## 2013-05-31 VITALS — BP 122/66 | HR 59 | Temp 98.9°F | Resp 16 | Ht 67.0 in | Wt 172.0 lb

## 2013-05-31 DIAGNOSIS — Z5181 Encounter for therapeutic drug level monitoring: Secondary | ICD-10-CM

## 2013-05-31 DIAGNOSIS — I498 Other specified cardiac arrhythmias: Secondary | ICD-10-CM

## 2013-05-31 DIAGNOSIS — J209 Acute bronchitis, unspecified: Secondary | ICD-10-CM | POA: Insufficient documentation

## 2013-05-31 DIAGNOSIS — I4891 Unspecified atrial fibrillation: Secondary | ICD-10-CM

## 2013-05-31 DIAGNOSIS — I509 Heart failure, unspecified: Secondary | ICD-10-CM

## 2013-05-31 DIAGNOSIS — R001 Bradycardia, unspecified: Secondary | ICD-10-CM

## 2013-05-31 LAB — BASIC METABOLIC PANEL
BUN: 13 mg/dL (ref 6–23)
CO2: 26 mEq/L (ref 19–32)
Calcium: 8.4 mg/dL (ref 8.4–10.5)
Chloride: 96 mEq/L (ref 96–112)
Glucose, Bld: 177 mg/dL — ABNORMAL HIGH (ref 70–99)
Potassium: 3.9 mEq/L (ref 3.5–5.3)
Sodium: 134 mEq/L — ABNORMAL LOW (ref 135–145)

## 2013-05-31 NOTE — Assessment & Plan Note (Signed)
Improving. Continue lasix 40mg  once daily. Home health RN will be checking weights in the home.

## 2013-05-31 NOTE — Progress Notes (Signed)
Pre visit review using our clinic review tool, if applicable. No additional management support is needed unless otherwise documented below in the visit note. 

## 2013-05-31 NOTE — Patient Instructions (Signed)
Complete lab work prior to leaving. Continue levaquin for 2 additional days. Mucinex 600mg  twice daily to thin secretions. Continue furosemide 40mg  once daily. Follow up on 1/5.

## 2013-05-31 NOTE — Progress Notes (Signed)
Subjective:    Patient ID: [name], male    DOB: 09/29/1927, 79 y.o.   MRN: [id number]  HPI  [name] is an 79 yr old male who presents today for follow up of his CHF exacerbation.    Lasix has been increased to 40mg  once daily. He is down 6 pounds in the last two days.  Family reports that pt's energy is improved and that his breathing is non-labored.  Home health is scheduled to bring his nebulizer to the home today.  Wt Readings from Last 3 Encounters:  05/31/13 172 lb (78.019 kg)  05/29/13 178 lb (80.74 kg)  03/29/13 170 lb (77.111 kg)     Review of Systems See HPI  Past Medical History  Diagnosis Date  . CAD (coronary artery disease)   . COPD (chronic obstructive pulmonary disease)   . C. difficile colitis     recurrent diarrhea  . Hyperlipidemia   . HTN (hypertension)   . Osteoarthritis   . BPH (benign prostatic hypertrophy)   . Low back pain   . Lumbar disc disease   . CHF (congestive heart failure)   . Permanent atrial fibrillation   . Colonic polyp   . Diverticulosis of colon   . Anxiety   . GERD (gastroesophageal reflux disease)   . PVD (peripheral vascular disease)   . TIA (transient ischemic attack)   . Diarrhea     recurrent diarrhea  . Tachy-brady syndrome   . Abdominal aneurysm     History   Social History  . Marital Status: Widowed    Spouse Name: N/A    Number of Children: N/A  . Years of Education: N/A   Occupational History  . Not on file.   Social History Main Topics  . Smoking status: Former Games developer  . Smokeless tobacco: Current User    Types: Chew  . Alcohol Use: No  . Drug Use: Not on file  . Sexual Activity: Not on file   Other Topics Concern  . Not on file   Social History Narrative   Former Smoker     Alcohol use-no   Widowed                Past Surgical History  Procedure Laterality Date  . Coronary artery bypass graft  1993  . Pacemaker placement  1992 and 2005    permanent and new placement 2005  .  Abdominal aortic aneurysm repair      with intraluminal graft  . Aortic valve replacement      s/p   . Retinal detachment surgery      repair of right eye 9/211- Dr Luciana Axe    Family History  Problem Relation Age of Onset  . Hypertension Mother   . Stroke Father     died of stroke  . Stroke Sister     died of stroke  . Cancer Other     wife diagnosed with advanced bladder cancer-deceased    No Known Allergies  Current Outpatient Prescriptions on File Prior to Visit  Medication Sig Dispense Refill  . albuterol (PROVENTIL) (2.5 MG/3ML) 0.083% nebulizer solution Take 3 mLs (2.5 mg total) by nebulization every 6 (six) hours as needed for wheezing or shortness of breath.  150 mL  1  . amLODipine (NORVASC) 5 MG tablet TAKE 1 TABLET BY MOUTH ONCE DAILY  90 tablet  0  . carvedilol (COREG) 25 MG tablet TAKE 1 TABLET BY MOUTH TWICE DAILY  60 tablet  3  . colchicine 0.6 MG tablet One po bid prn gout      . DIGOX 125 MCG tablet TAKE 1 TABLET BY MOUTH DAILY  30 tablet  3  . finasteride (PROSCAR) 5 MG tablet Take 1 tablet (5 mg total) by mouth daily.  30 tablet  5  . furosemide (LASIX) 20 MG tablet Take 2 tablets (40 mg total) by mouth daily.  30 tablet  0  . levofloxacin (LEVAQUIN) 250 MG tablet Take 1 tablet (250 mg total) by mouth daily.  2 tablet  0  . LORazepam (ATIVAN) 1 MG tablet TAKE 1/2 TABLET BY MOUTH ONCE DAILY AS NEEDED  30 tablet  0  . losartan (COZAAR) 50 MG tablet TAKE 1 TABLET BY MOUTH EVERY DAY  30 tablet  0  . nystatin ointment (MYCOSTATIN) Apply topically 2 (two) times daily. Apply twice daily to buttocks/rash until healed  30 g  1  . omeprazole (PRILOSEC) 20 MG capsule Take 20 mg by mouth daily. For reflux       . Silodosin (RAPAFLO PO) Take 8 mg by mouth daily. To help with urinary flow      . simvastatin (ZOCOR) 20 MG tablet TAKE ONE TABLET BY MOUTH AT BEDTIME  90 tablet  1  . traMADol (ULTRAM) 50 MG tablet Take 1 tablet (50 mg total) by mouth every 8 (eight) hours as  needed for pain.  30 tablet  0  . warfarin (COUMADIN) 5 MG tablet USE AS DIRECTED BY ANTICOAGULATION CLINIC.  40 tablet  3   No current facility-administered medications on file prior to visit.    BP 122/66  Pulse 59  Temp(Src) 98.9 F (37.2 C) (Oral)  Resp 16  Ht 5\' 7"  (1.702 m)  Wt 172 lb (78.019 kg)  BMI 26.93 kg/m2  SpO2 96%       Objective:   Physical Exam  Constitutional: He appears well-developed and well-nourished. No distress.  Cardiovascular: Normal rate and regular rhythm.   No murmur heard. Pulmonary/Chest: Effort normal and breath sounds normal.  Musculoskeletal:  2+ edema  Psychiatric: He has a normal mood and affect. His behavior is normal. Judgment and thought content normal.          Assessment & Plan:

## 2013-05-31 NOTE — Assessment & Plan Note (Signed)
Clinically improving. Continue levaquin x 7 days total.  Coumadin is on hold per coumadin clinic due to supratherapeutic INR.

## 2013-05-31 NOTE — Assessment & Plan Note (Signed)
Mild bradycardia. Check dig level.

## 2013-06-01 LAB — DIGOXIN LEVEL: Digoxin Level: 0.8 ng/mL (ref 0.8–2.0)

## 2013-06-01 LAB — URINE CULTURE: Colony Count: NO GROWTH

## 2013-06-02 ENCOUNTER — Encounter: Payer: Self-pay | Admitting: Family

## 2013-06-05 ENCOUNTER — Ambulatory Visit (INDEPENDENT_AMBULATORY_CARE_PROVIDER_SITE_OTHER): Payer: Medicare Other

## 2013-06-05 DIAGNOSIS — I359 Nonrheumatic aortic valve disorder, unspecified: Secondary | ICD-10-CM

## 2013-06-05 DIAGNOSIS — Z8679 Personal history of other diseases of the circulatory system: Secondary | ICD-10-CM

## 2013-06-05 DIAGNOSIS — Z954 Presence of other heart-valve replacement: Secondary | ICD-10-CM

## 2013-06-05 DIAGNOSIS — I4891 Unspecified atrial fibrillation: Secondary | ICD-10-CM

## 2013-06-05 LAB — POCT INR: INR: 2

## 2013-06-12 ENCOUNTER — Ambulatory Visit (INDEPENDENT_AMBULATORY_CARE_PROVIDER_SITE_OTHER): Payer: Medicare Other | Admitting: Family

## 2013-06-12 ENCOUNTER — Encounter: Payer: Self-pay | Admitting: Family

## 2013-06-12 VITALS — BP 118/50 | HR 59 | Temp 97.8°F | Resp 18 | Ht 67.0 in | Wt 172.0 lb

## 2013-06-12 DIAGNOSIS — J449 Chronic obstructive pulmonary disease, unspecified: Secondary | ICD-10-CM

## 2013-06-12 DIAGNOSIS — J4489 Other specified chronic obstructive pulmonary disease: Secondary | ICD-10-CM

## 2013-06-12 DIAGNOSIS — I10 Essential (primary) hypertension: Secondary | ICD-10-CM

## 2013-06-12 DIAGNOSIS — I509 Heart failure, unspecified: Secondary | ICD-10-CM

## 2013-06-12 LAB — BASIC METABOLIC PANEL
BUN: 14 mg/dL (ref 6–23)
CO2: 31 mEq/L (ref 19–32)
CREATININE: 0.88 mg/dL (ref 0.50–1.35)
Calcium: 8.7 mg/dL (ref 8.4–10.5)
Chloride: 99 mEq/L (ref 96–112)
GLUCOSE: 145 mg/dL — AB (ref 70–99)
Potassium: 4.6 mEq/L (ref 3.5–5.3)
SODIUM: 139 meq/L (ref 135–145)

## 2013-06-12 NOTE — Assessment & Plan Note (Signed)
Weight stable on current dose of lasix, clinically not dehydrated, continue current dose, obtain bmet.

## 2013-06-12 NOTE — Assessment & Plan Note (Signed)
Stable with prn albuterol- continue same.

## 2013-06-12 NOTE — Progress Notes (Signed)
Subjective:    Patient ID: [name], male    DOB: 03-18-1928, 80 y.o.   MRN: [id number]  HPI  Mr. Iyengar is an 80 yr old male who presents today with his daughter for follow up.  CHF- Weight is stable since last visit on 40mg  of lasix. Notes less frequent urination recently and dry mouth.  Wt Readings from Last 3 Encounters:  06/12/13 172 lb (78.019 kg)  05/31/13 172 lb (78.019 kg)  05/29/13 178 lb (80.74 kg)   COPD/Bronchitis- resolved. They now have a nebulizer for prn albuterol at home.    Review of Systems See HPI  Past Medical History  Diagnosis Date  . CAD (coronary artery disease)   . COPD (chronic obstructive pulmonary disease)   . C. difficile colitis     recurrent diarrhea  . Hyperlipidemia   . HTN (hypertension)   . Osteoarthritis   . BPH (benign prostatic hypertrophy)   . Low back pain   . Lumbar disc disease   . CHF (congestive heart failure)   . Permanent atrial fibrillation   . Colonic polyp   . Diverticulosis of colon   . Anxiety   . GERD (gastroesophageal reflux disease)   . PVD (peripheral vascular disease)   . TIA (transient ischemic attack)   . Diarrhea     recurrent diarrhea  . Tachy-brady syndrome   . Abdominal aneurysm     History   Social History  . Marital Status: Widowed    Spouse Name: N/A    Number of Children: N/A  . Years of Education: N/A   Occupational History  . Not on file.   Social History Main Topics  . Smoking status: Former Research scientist (life sciences)  . Smokeless tobacco: Current User    Types: Chew  . Alcohol Use: No  . Drug Use: Not on file  . Sexual Activity: Not on file   Other Topics Concern  . Not on file   Social History Narrative   Former Smoker     Alcohol use-no   Widowed                Past Surgical History  Procedure Laterality Date  . Coronary artery bypass graft  1993  . Pacemaker placement  1992 and 2005    permanent and new placement 2005  . Abdominal aortic aneurysm repair      with  intraluminal graft  . Aortic valve replacement      s/p   . Retinal detachment surgery      repair of right eye 9/211- Dr Zadie Rhine    Family History  Problem Relation Age of Onset  . Hypertension Mother   . Stroke Father     died of stroke  . Stroke Sister     died of stroke  . Cancer Other     wife diagnosed with advanced bladder cancer-deceased    No Known Allergies  Current Outpatient Prescriptions on File Prior to Visit  Medication Sig Dispense Refill  . albuterol (PROVENTIL) (2.5 MG/3ML) 0.083% nebulizer solution Take 3 mLs (2.5 mg total) by nebulization every 6 (six) hours as needed for wheezing or shortness of breath.  150 mL  1  . amLODipine (NORVASC) 5 MG tablet TAKE 1 TABLET BY MOUTH ONCE DAILY  90 tablet  0  . carvedilol (COREG) 25 MG tablet TAKE 1 TABLET BY MOUTH TWICE DAILY  60 tablet  3  . colchicine 0.6 MG tablet One po bid prn gout      .  DIGOX 125 MCG tablet TAKE 1 TABLET BY MOUTH DAILY  30 tablet  3  . finasteride (PROSCAR) 5 MG tablet Take 1 tablet (5 mg total) by mouth daily.  30 tablet  5  . furosemide (LASIX) 20 MG tablet Take 2 tablets (40 mg total) by mouth daily.  30 tablet  0  . LORazepam (ATIVAN) 1 MG tablet TAKE 1/2 TABLET BY MOUTH ONCE DAILY AS NEEDED  30 tablet  0  . losartan (COZAAR) 50 MG tablet TAKE 1 TABLET BY MOUTH EVERY DAY  30 tablet  0  . nystatin ointment (MYCOSTATIN) Apply topically 2 (two) times daily. Apply twice daily to buttocks/rash until healed  30 g  1  . omeprazole (PRILOSEC) 20 MG capsule Take 20 mg by mouth daily. For reflux       . Silodosin (RAPAFLO PO) Take 8 mg by mouth daily. To help with urinary flow      . simvastatin (ZOCOR) 20 MG tablet TAKE ONE TABLET BY MOUTH AT BEDTIME  90 tablet  1  . traMADol (ULTRAM) 50 MG tablet Take 1 tablet (50 mg total) by mouth every 8 (eight) hours as needed for pain.  30 tablet  0  . warfarin (COUMADIN) 5 MG tablet USE AS DIRECTED BY ANTICOAGULATION CLINIC.  40 tablet  3   No current  facility-administered medications on file prior to visit.    BP 118/50  Pulse 59  Temp(Src) 97.8 F (36.6 C) (Oral)  Resp 18  Ht 5\' 7"  (1.702 m)  Wt 172 lb (78.019 kg)  BMI 26.93 kg/m2  SpO2 97%       Objective:   Physical Exam  Constitutional: He is oriented to person, place, and time. He appears well-developed and well-nourished. No distress.  Cardiovascular: An irregular rhythm present.  Pulmonary/Chest: Effort normal and breath sounds normal. No respiratory distress. He has no wheezes. He has no rales.  Musculoskeletal:  1-2+ bilateral LE edema.  Neurological: He is alert and oriented to person, place, and time.  Hard of hearing  Psychiatric: He has a normal mood and affect. His behavior is normal. Judgment and thought content normal.          Assessment & Plan:

## 2013-06-12 NOTE — Progress Notes (Signed)
Pre visit review using our clinic review tool, if applicable. No additional management support is needed unless otherwise documented below in the visit note. 

## 2013-06-12 NOTE — Patient Instructions (Signed)
Please follow up in 6 weeks. Complete lab work prior to leaving.

## 2013-06-13 ENCOUNTER — Encounter: Payer: Self-pay | Admitting: Family

## 2013-06-19 ENCOUNTER — Ambulatory Visit (INDEPENDENT_AMBULATORY_CARE_PROVIDER_SITE_OTHER): Payer: Medicare Other | Admitting: *Deleted

## 2013-06-19 ENCOUNTER — Other Ambulatory Visit: Payer: Self-pay | Admitting: Family

## 2013-06-19 DIAGNOSIS — I359 Nonrheumatic aortic valve disorder, unspecified: Secondary | ICD-10-CM

## 2013-06-19 DIAGNOSIS — I4891 Unspecified atrial fibrillation: Secondary | ICD-10-CM

## 2013-06-19 DIAGNOSIS — Z954 Presence of other heart-valve replacement: Secondary | ICD-10-CM

## 2013-06-19 DIAGNOSIS — Z8679 Personal history of other diseases of the circulatory system: Secondary | ICD-10-CM

## 2013-06-19 LAB — POCT INR: INR: 2.9

## 2013-06-20 NOTE — Telephone Encounter (Signed)
Please confirm directions and quantity for furosemide request and advise refill for lorazepam. Losartan refill sent to pharmacy.

## 2013-06-21 NOTE — Telephone Encounter (Signed)
OK to send refills as below.

## 2013-06-23 NOTE — Telephone Encounter (Signed)
Patient is wanting to know why other lorazepam and furosemide have not been sent to pharmacy

## 2013-06-23 NOTE — Telephone Encounter (Signed)
Rxs phoned to pharmacy. Pt's # busy. Left detailed message re: rx completion on daughter's voicemail and to call if any questions.

## 2013-06-26 ENCOUNTER — Other Ambulatory Visit: Payer: Self-pay | Admitting: Cardiology

## 2013-06-27 ENCOUNTER — Encounter: Payer: Self-pay | Admitting: Internal Medicine

## 2013-06-27 DIAGNOSIS — I495 Sick sinus syndrome: Secondary | ICD-10-CM

## 2013-07-03 ENCOUNTER — Other Ambulatory Visit: Payer: Self-pay | Admitting: Cardiology

## 2013-07-10 ENCOUNTER — Telehealth: Payer: Self-pay | Admitting: Family

## 2013-07-10 ENCOUNTER — Ambulatory Visit (INDEPENDENT_AMBULATORY_CARE_PROVIDER_SITE_OTHER): Payer: Medicare Other | Admitting: Pharmacist

## 2013-07-10 DIAGNOSIS — I359 Nonrheumatic aortic valve disorder, unspecified: Secondary | ICD-10-CM

## 2013-07-10 DIAGNOSIS — I4891 Unspecified atrial fibrillation: Secondary | ICD-10-CM

## 2013-07-10 DIAGNOSIS — Z5181 Encounter for therapeutic drug level monitoring: Secondary | ICD-10-CM

## 2013-07-10 DIAGNOSIS — Z8679 Personal history of other diseases of the circulatory system: Secondary | ICD-10-CM

## 2013-07-10 DIAGNOSIS — Z954 Presence of other heart-valve replacement: Secondary | ICD-10-CM

## 2013-07-10 LAB — POCT INR: INR: 4

## 2013-07-10 NOTE — Telephone Encounter (Signed)
Relevant patient education mailed to patient.  

## 2013-07-20 ENCOUNTER — Other Ambulatory Visit: Payer: Self-pay | Admitting: Vascular Surgery

## 2013-07-20 DIAGNOSIS — I714 Abdominal aortic aneurysm, without rupture, unspecified: Secondary | ICD-10-CM

## 2013-07-20 DIAGNOSIS — Z48812 Encounter for surgical aftercare following surgery on the circulatory system: Secondary | ICD-10-CM

## 2013-07-24 ENCOUNTER — Ambulatory Visit: Payer: Medicare Other | Admitting: Family

## 2013-07-28 ENCOUNTER — Ambulatory Visit (INDEPENDENT_AMBULATORY_CARE_PROVIDER_SITE_OTHER): Payer: Medicare Other | Admitting: *Deleted

## 2013-07-28 DIAGNOSIS — Z5181 Encounter for therapeutic drug level monitoring: Secondary | ICD-10-CM

## 2013-07-28 DIAGNOSIS — I359 Nonrheumatic aortic valve disorder, unspecified: Secondary | ICD-10-CM

## 2013-07-28 DIAGNOSIS — Z8679 Personal history of other diseases of the circulatory system: Secondary | ICD-10-CM

## 2013-07-28 DIAGNOSIS — I4891 Unspecified atrial fibrillation: Secondary | ICD-10-CM

## 2013-07-28 DIAGNOSIS — Z954 Presence of other heart-valve replacement: Secondary | ICD-10-CM

## 2013-07-28 LAB — POCT INR: INR: 2.4

## 2013-08-02 ENCOUNTER — Ambulatory Visit: Payer: Medicare Other | Admitting: Family

## 2013-08-07 ENCOUNTER — Other Ambulatory Visit: Payer: Self-pay | Admitting: Cardiology

## 2013-08-07 ENCOUNTER — Other Ambulatory Visit: Payer: Self-pay | Admitting: Family

## 2013-08-07 NOTE — Telephone Encounter (Signed)
Pt last seen in January and has follow up 08/28/13.  Please advise.  Medication name:  Name from pharmacy:  LORazepam (ATIVAN) 1 MG tablet  LORAZEPAM 1MG  TABLETS Sig: TAKE 1/2 TABLET BY MOUTH DAILY AS NEEDED Dispense: 30 tablet Refills: 0 Start: 08/07/2013 Class: Normal Requested on: 08/07/2013 Originally ordered on: 08/29/2010 Last refill: 06/23/2013

## 2013-08-08 ENCOUNTER — Ambulatory Visit: Payer: Medicare Other | Admitting: Family

## 2013-08-08 NOTE — Telephone Encounter (Signed)
OK to send 30 tabs with zero refills.

## 2013-08-08 NOTE — Telephone Encounter (Signed)
Refill called to pharmacy voicemail. 

## 2013-08-14 ENCOUNTER — Other Ambulatory Visit: Payer: Self-pay | Admitting: Family

## 2013-08-15 ENCOUNTER — Ambulatory Visit (INDEPENDENT_AMBULATORY_CARE_PROVIDER_SITE_OTHER): Payer: Medicare Other | Admitting: *Deleted

## 2013-08-15 DIAGNOSIS — Z5181 Encounter for therapeutic drug level monitoring: Secondary | ICD-10-CM

## 2013-08-15 DIAGNOSIS — I359 Nonrheumatic aortic valve disorder, unspecified: Secondary | ICD-10-CM

## 2013-08-15 DIAGNOSIS — Z8679 Personal history of other diseases of the circulatory system: Secondary | ICD-10-CM

## 2013-08-15 DIAGNOSIS — Z954 Presence of other heart-valve replacement: Secondary | ICD-10-CM

## 2013-08-15 DIAGNOSIS — I4891 Unspecified atrial fibrillation: Secondary | ICD-10-CM

## 2013-08-15 LAB — POCT INR: INR: 2.7

## 2013-08-15 NOTE — Telephone Encounter (Signed)
Looks like last refill we sent was for #30 and directions are 2 tablets daily.  Do we need to increase quantity to #60?  Medication name:  Name from pharmacy:  furosemide (LASIX) 20 MG tablet  FUROSEMIDE 20MG  TABLETS Sig: TAKE 2 TABLETS BY MOUTH DAILY Dispense: 30 tablet Refills: 0 Start: 08/14/2013 Class: Normal Requested on: 08/14/2013 Originally ordered on: 05/29/2013 Last refill: 07/10/2013

## 2013-08-15 NOTE — Telephone Encounter (Signed)
Yes please increase to 60.

## 2013-08-16 NOTE — Telephone Encounter (Signed)
Refill sent.

## 2013-08-28 ENCOUNTER — Encounter: Payer: Self-pay | Admitting: Family

## 2013-08-28 ENCOUNTER — Ambulatory Visit (INDEPENDENT_AMBULATORY_CARE_PROVIDER_SITE_OTHER): Payer: Medicare Other | Admitting: Family

## 2013-08-28 VITALS — BP 148/70 | HR 60 | Temp 97.7°F | Resp 16 | Ht 67.0 in | Wt 170.1 lb

## 2013-08-28 DIAGNOSIS — I509 Heart failure, unspecified: Secondary | ICD-10-CM

## 2013-08-28 DIAGNOSIS — I10 Essential (primary) hypertension: Secondary | ICD-10-CM

## 2013-08-28 DIAGNOSIS — E785 Hyperlipidemia, unspecified: Secondary | ICD-10-CM

## 2013-08-28 DIAGNOSIS — G4700 Insomnia, unspecified: Secondary | ICD-10-CM

## 2013-08-28 NOTE — Progress Notes (Signed)
Pre visit review using our clinic review tool, if applicable. No additional management support is needed unless otherwise documented below in the visit note. 

## 2013-08-28 NOTE — Patient Instructions (Addendum)
Please continue your current meds.   Return to lab at your convenience for lab draw. Follow up in 3 months.

## 2013-08-28 NOTE — Progress Notes (Signed)
Subjective:    Patient ID: [name], male    DOB: 1927-12-10, 80 y.o.   MRN: [id number]  HPI  [name] is an 80 yr old male who presents today for follow up of multiple medical problems.  1) HTN- current blood pressure meds include amlodipine and coreg. Denies chest pain.   BP Readings from Last 3 Encounters:  08/28/13 148/70  06/12/13 118/50  05/31/13 122/66     2) CHF- family is watching his weight closely and reports that weight has been stable.  They are giving him furosemide 20mg  1 1/2 tabs daily.  If weight gain noted they give 2 pills.  Denies shortness of breath. Notes mild LE edema.  Wt Readings from Last 3 Encounters:  08/28/13 170 lb 1.9 oz (77.166 kg)  06/12/13 172 lb (78.019 kg)  05/31/13 172 lb (78.019 kg)   3) Sleeping problem- difficulty staying asleep. Takes 1/2 tab of lorazepam at bedtime. Reports that he gets up to urinate and has trouble getting back to sleep.   Reports that he urinates 6-7 times a night.  Drinks a cup of tea in the afternoon  Review of Systems See HPI  Past Medical History  Diagnosis Date  . CAD (coronary artery disease)   . COPD (chronic obstructive pulmonary disease)   . C. difficile colitis     recurrent diarrhea  . Hyperlipidemia   . HTN (hypertension)   . Osteoarthritis   . BPH (benign prostatic hypertrophy)   . Low back pain   . Lumbar disc disease   . CHF (congestive heart failure)   . Permanent atrial fibrillation   . Colonic polyp   . Diverticulosis of colon   . Anxiety   . GERD (gastroesophageal reflux disease)   . PVD (peripheral vascular disease)   . TIA (transient ischemic attack)   . Diarrhea     recurrent diarrhea  . Tachy-brady syndrome   . Abdominal aneurysm     History   Social History  . Marital Status: Widowed    Spouse Name: N/A    Number of Children: N/A  . Years of Education: N/A   Occupational History  . Not on file.   Social History Main Topics  . Smoking status: Former Research scientist (life sciences)  .  Smokeless tobacco: Current User    Types: Chew  . Alcohol Use: No  . Drug Use: Not on file  . Sexual Activity: Not on file   Other Topics Concern  . Not on file   Social History Narrative   Former Smoker     Alcohol use-no   Widowed                Past Surgical History  Procedure Laterality Date  . Coronary artery bypass graft  1993  . Pacemaker placement  1992 and 2005    permanent and new placement 2005  . Abdominal aortic aneurysm repair      with intraluminal graft  . Aortic valve replacement      s/p   . Retinal detachment surgery      repair of right eye 9/211- Dr Zadie Rhine    Family History  Problem Relation Age of Onset  . Hypertension Mother   . Stroke Father     died of stroke  . Stroke Sister     died of stroke  . Cancer Other     wife diagnosed with advanced bladder cancer-deceased    No Known Allergies  Current Outpatient Prescriptions on File Prior  to Visit  Medication Sig Dispense Refill  . albuterol (PROVENTIL) (2.5 MG/3ML) 0.083% nebulizer solution Take 3 mLs (2.5 mg total) by nebulization every 6 (six) hours as needed for wheezing or shortness of breath.  150 mL  1  . amLODipine (NORVASC) 5 MG tablet TAKE 1 TABLET BY MOUTH EVERY DAY  90 tablet  0  . carvedilol (COREG) 25 MG tablet TAKE 1 TABLET BY MOUTH TWICE DAILY  60 tablet  0  . colchicine 0.6 MG tablet One po bid prn gout      . COUMADIN 5 MG tablet USE AS DIRECTED BY ANTICOAGULANT CLINIC  40 tablet  3  . DIGOX 125 MCG tablet TAKE 1 TABLET BY MOUTH DAILY  30 tablet  3  . finasteride (PROSCAR) 5 MG tablet Take 1 tablet (5 mg total) by mouth daily.  30 tablet  5  . LORazepam (ATIVAN) 1 MG tablet TAKE 1/2 TABLET BY MOUTH DAILY AS NEEDED  30 tablet  0  . losartan (COZAAR) 50 MG tablet TAKE 1 TABLET BY MOUTH DAILY  30 tablet  2  . nystatin ointment (MYCOSTATIN) Apply topically 2 (two) times daily. Apply twice daily to buttocks/rash until healed  30 g  1  . omeprazole (PRILOSEC) 20 MG capsule Take 20  mg by mouth daily. For reflux       . Silodosin (RAPAFLO PO) Take 8 mg by mouth daily. To help with urinary flow      . simvastatin (ZOCOR) 20 MG tablet TAKE ONE TABLET BY MOUTH AT BEDTIME  90 tablet  1  . traMADol (ULTRAM) 50 MG tablet Take 1 tablet (50 mg total) by mouth every 8 (eight) hours as needed for pain.  30 tablet  0   No current facility-administered medications on file prior to visit.    BP 148/70  Pulse 60  Temp(Src) 97.7 F (36.5 C) (Oral)  Resp 16  Ht 5\' 7"  (1.702 m)  Wt 170 lb 1.9 oz (77.166 kg)  BMI 26.64 kg/m2  SpO2 96%       Objective:   Physical Exam  Constitutional: He is oriented to person, place, and time. He appears well-developed and well-nourished. No distress.  HENT:  Head: Normocephalic and atraumatic.  Cardiovascular: Normal rate and regular rhythm.   No murmur heard. Pulmonary/Chest: Effort normal and breath sounds normal. No respiratory distress. He has no wheezes. He has no rales. He exhibits no tenderness.  Musculoskeletal:  2+ bilateral LE edema  Neurological: He is alert and oriented to person, place, and time.  Skin: Skin is warm and dry.  Psychiatric: He has a normal mood and affect. His behavior is normal. Judgment and thought content normal.          Assessment & Plan:

## 2013-08-29 ENCOUNTER — Telehealth: Payer: Self-pay | Admitting: Family

## 2013-08-29 NOTE — Telephone Encounter (Signed)
Relevant patient education mailed to patient.  

## 2013-09-03 NOTE — Assessment & Plan Note (Signed)
Clinically euvolemic today. Continue lasix, family continues to monitor weights closely.

## 2013-09-03 NOTE — Assessment & Plan Note (Signed)
We discussed eliminating caffeine in the afternoon.  Would like to avoid sedatives given advanced age and increased falls risk.

## 2013-09-03 NOTE — Assessment & Plan Note (Signed)
BP slightly elevated today.  Has been much better last two visits. Continue current meds.  If sbp remains elevated next visit, consider medication adjustment.

## 2013-09-05 ENCOUNTER — Ambulatory Visit (INDEPENDENT_AMBULATORY_CARE_PROVIDER_SITE_OTHER): Payer: Medicare Other | Admitting: *Deleted

## 2013-09-05 DIAGNOSIS — I359 Nonrheumatic aortic valve disorder, unspecified: Secondary | ICD-10-CM

## 2013-09-05 DIAGNOSIS — Z8679 Personal history of other diseases of the circulatory system: Secondary | ICD-10-CM

## 2013-09-05 DIAGNOSIS — Z5181 Encounter for therapeutic drug level monitoring: Secondary | ICD-10-CM

## 2013-09-05 DIAGNOSIS — Z954 Presence of other heart-valve replacement: Secondary | ICD-10-CM

## 2013-09-05 DIAGNOSIS — I4891 Unspecified atrial fibrillation: Secondary | ICD-10-CM

## 2013-09-05 LAB — POCT INR: INR: 2.7

## 2013-09-06 ENCOUNTER — Other Ambulatory Visit: Payer: Self-pay | Admitting: Cardiology

## 2013-09-26 ENCOUNTER — Other Ambulatory Visit: Payer: Self-pay | Admitting: Cardiology

## 2013-09-26 ENCOUNTER — Other Ambulatory Visit: Payer: Self-pay | Admitting: Family

## 2013-09-27 ENCOUNTER — Encounter: Payer: Self-pay | Admitting: Internal Medicine

## 2013-09-27 DIAGNOSIS — I495 Sick sinus syndrome: Secondary | ICD-10-CM

## 2013-09-29 ENCOUNTER — Other Ambulatory Visit: Payer: Self-pay | Admitting: Family

## 2013-10-03 ENCOUNTER — Emergency Department (HOSPITAL_BASED_OUTPATIENT_CLINIC_OR_DEPARTMENT_OTHER): Payer: Medicare Other

## 2013-10-03 ENCOUNTER — Emergency Department (HOSPITAL_BASED_OUTPATIENT_CLINIC_OR_DEPARTMENT_OTHER)
Admission: EM | Admit: 2013-10-03 | Discharge: 2013-10-03 | Disposition: A | Discharge status: Home / Self Care | Payer: Medicare Other | Attending: Emergency Medicine | Admitting: Emergency Medicine

## 2013-10-03 ENCOUNTER — Encounter (HOSPITAL_BASED_OUTPATIENT_CLINIC_OR_DEPARTMENT_OTHER): Payer: Self-pay | Admitting: Emergency Medicine

## 2013-10-03 ENCOUNTER — Ambulatory Visit (INDEPENDENT_AMBULATORY_CARE_PROVIDER_SITE_OTHER): Payer: Medicare Other | Admitting: *Deleted

## 2013-10-03 DIAGNOSIS — I359 Nonrheumatic aortic valve disorder, unspecified: Secondary | ICD-10-CM

## 2013-10-03 DIAGNOSIS — G319 Degenerative disease of nervous system, unspecified: Secondary | ICD-10-CM | POA: Insufficient documentation

## 2013-10-03 DIAGNOSIS — I4891 Unspecified atrial fibrillation: Secondary | ICD-10-CM

## 2013-10-03 DIAGNOSIS — Z860100 Personal history of colon polyps, unspecified: Secondary | ICD-10-CM | POA: Insufficient documentation

## 2013-10-03 DIAGNOSIS — Z8619 Personal history of other infectious and parasitic diseases: Secondary | ICD-10-CM | POA: Insufficient documentation

## 2013-10-03 DIAGNOSIS — Z8679 Personal history of other diseases of the circulatory system: Secondary | ICD-10-CM

## 2013-10-03 DIAGNOSIS — K219 Gastro-esophageal reflux disease without esophagitis: Secondary | ICD-10-CM | POA: Insufficient documentation

## 2013-10-03 DIAGNOSIS — Z87891 Personal history of nicotine dependence: Secondary | ICD-10-CM | POA: Insufficient documentation

## 2013-10-03 DIAGNOSIS — Z951 Presence of aortocoronary bypass graft: Secondary | ICD-10-CM | POA: Insufficient documentation

## 2013-10-03 DIAGNOSIS — S42209A Unspecified fracture of upper end of unspecified humerus, initial encounter for closed fracture: Secondary | ICD-10-CM | POA: Insufficient documentation

## 2013-10-03 DIAGNOSIS — I2510 Atherosclerotic heart disease of native coronary artery without angina pectoris: Secondary | ICD-10-CM | POA: Insufficient documentation

## 2013-10-03 DIAGNOSIS — I10 Essential (primary) hypertension: Secondary | ICD-10-CM | POA: Insufficient documentation

## 2013-10-03 DIAGNOSIS — Z79899 Other long term (current) drug therapy: Secondary | ICD-10-CM | POA: Insufficient documentation

## 2013-10-03 DIAGNOSIS — Z950 Presence of cardiac pacemaker: Secondary | ICD-10-CM | POA: Insufficient documentation

## 2013-10-03 DIAGNOSIS — F411 Generalized anxiety disorder: Secondary | ICD-10-CM | POA: Insufficient documentation

## 2013-10-03 DIAGNOSIS — Z954 Presence of other heart-valve replacement: Secondary | ICD-10-CM

## 2013-10-03 DIAGNOSIS — W1809XA Striking against other object with subsequent fall, initial encounter: Secondary | ICD-10-CM | POA: Insufficient documentation

## 2013-10-03 DIAGNOSIS — J449 Chronic obstructive pulmonary disease, unspecified: Secondary | ICD-10-CM | POA: Insufficient documentation

## 2013-10-03 DIAGNOSIS — J4489 Other specified chronic obstructive pulmonary disease: Secondary | ICD-10-CM | POA: Insufficient documentation

## 2013-10-03 DIAGNOSIS — Z5181 Encounter for therapeutic drug level monitoring: Secondary | ICD-10-CM

## 2013-10-03 DIAGNOSIS — Y9301 Activity, walking, marching and hiking: Secondary | ICD-10-CM | POA: Insufficient documentation

## 2013-10-03 DIAGNOSIS — N400 Enlarged prostate without lower urinary tract symptoms: Secondary | ICD-10-CM | POA: Insufficient documentation

## 2013-10-03 DIAGNOSIS — Z8673 Personal history of transient ischemic attack (TIA), and cerebral infarction without residual deficits: Secondary | ICD-10-CM | POA: Insufficient documentation

## 2013-10-03 DIAGNOSIS — W010XXA Fall on same level from slipping, tripping and stumbling without subsequent striking against object, initial encounter: Secondary | ICD-10-CM | POA: Insufficient documentation

## 2013-10-03 DIAGNOSIS — Y92009 Unspecified place in unspecified non-institutional (private) residence as the place of occurrence of the external cause: Secondary | ICD-10-CM | POA: Insufficient documentation

## 2013-10-03 DIAGNOSIS — Z7901 Long term (current) use of anticoagulants: Secondary | ICD-10-CM | POA: Insufficient documentation

## 2013-10-03 DIAGNOSIS — M1990 Unspecified osteoarthritis, unspecified site: Secondary | ICD-10-CM | POA: Insufficient documentation

## 2013-10-03 DIAGNOSIS — W19XXXA Unspecified fall, initial encounter: Secondary | ICD-10-CM

## 2013-10-03 DIAGNOSIS — E785 Hyperlipidemia, unspecified: Secondary | ICD-10-CM | POA: Insufficient documentation

## 2013-10-03 DIAGNOSIS — I509 Heart failure, unspecified: Secondary | ICD-10-CM | POA: Insufficient documentation

## 2013-10-03 LAB — POCT INR: INR: 2

## 2013-10-03 MED ORDER — HYDROCODONE-ACETAMINOPHEN 5-325 MG PO TABS
1.0000 | ORAL_TABLET | Freq: Once | ORAL | Status: AC
  Administered 2013-10-03: 1 via ORAL
  Filled 2013-10-03: qty 1

## 2013-10-03 MED ORDER — HYDROCODONE-ACETAMINOPHEN 5-325 MG PO TABS: 1.0000 | ORAL_TABLET | ORAL | Status: DC | PRN

## 2013-10-03 NOTE — ED Notes (Signed)
Patient transported to CT 

## 2013-10-03 NOTE — ED Notes (Signed)
MD at bedside. 

## 2013-10-03 NOTE — ED Provider Notes (Signed)
CSN: [id number]     Arrival date & time 10/03/13  1358 History   First MD Initiated Contact with Patient 10/03/13 1447     Chief Complaint  Patient presents with  . Fall  . Shoulder Injury     (Consider location/radiation/quality/duration/timing/severity/associated sxs/prior Treatment) HPI Comments: Pt comes in c/o left shoulder pain after a fall a short time ago in his house. No loc with fall. Pt states that he was sleeping and his daughter knocked on the door and he was dazed and stood up without his walker and he slipped hitting his shoulder.pt denies hitting his head. Pt states that he can't move his left shoulder  The history is provided by the patient. No language interpreter was used.    Past Medical History  Diagnosis Date  . CAD (coronary artery disease)   . COPD (chronic obstructive pulmonary disease)   . C. difficile colitis     recurrent diarrhea  . Hyperlipidemia   . HTN (hypertension)   . Osteoarthritis   . BPH (benign prostatic hypertrophy)   . Low back pain   . Lumbar disc disease   . CHF (congestive heart failure)   . Permanent atrial fibrillation   . Colonic polyp   . Diverticulosis of colon   . Anxiety   . GERD (gastroesophageal reflux disease)   . PVD (peripheral vascular disease)   . TIA (transient ischemic attack)   . Diarrhea     recurrent diarrhea  . Tachy-brady syndrome   . Abdominal aneurysm    Past Surgical History  Procedure Laterality Date  . Coronary artery bypass graft  1993  . Pacemaker placement  1992 and 2005    permanent and new placement 2005  . Abdominal aortic aneurysm repair      with intraluminal graft  . Aortic valve replacement      s/p   . Retinal detachment surgery      repair of right eye 9/211- Dr Zadie Rhine   Family History  Problem Relation Age of Onset  . Hypertension Mother   . Stroke Father     died of stroke  . Stroke Sister     died of stroke  . Cancer Other     wife diagnosed with advanced bladder  cancer-deceased   History  Substance Use Topics  . Smoking status: Former Research scientist (life sciences)  . Smokeless tobacco: Current User    Types: Chew  . Alcohol Use: No    Review of Systems  Constitutional: Negative.   Respiratory: Negative.   Cardiovascular: Negative.       Allergies  Review of patient's allergies indicates no known allergies.  Home Medications   Prior to Admission medications   Medication Sig Start Date End Date Taking? Authorizing Provider  albuterol (PROVENTIL) (2.5 MG/3ML) 0.083% nebulizer solution Take 3 mLs (2.5 mg total) by nebulization every 6 (six) hours as needed for wheezing or shortness of breath. 05/29/13   Debbrah Alar, NP  amLODipine (NORVASC) 5 MG tablet TAKE 1 TABLET BY MOUTH DAILY    Evans Lance, MD  carvedilol (COREG) 25 MG tablet TAKE 1 TABLET BY MOUTH TWICE DAILY    Lelon Perla, MD  colchicine 0.6 MG tablet One po bid prn gout 07/27/11   Burnice Logan, MD  COUMADIN 5 MG tablet USE AS DIRECTED BY ANTICOAGULANT CLINIC 06/26/13   Evans Lance, MD  [place] 125 MCG tablet TAKE 1 TABLET BY MOUTH EVERY DAY 09/29/13   Debbrah Alar, NP  finasteride (  PROSCAR) 5 MG tablet Take 1 tablet (5 mg total) by mouth daily. 05/10/13   Evans Lance, MD  furosemide (LASIX) 20 MG tablet Take 1 1/2 to 2 tablets daily for fluid    Debbrah Alar, NP  LORazepam (ATIVAN) 1 MG tablet TAKE 1/2 TABLET BY MOUTH DAILY AS NEEDED    Debbrah Alar, NP  losartan (COZAAR) 50 MG tablet TAKE 1 TABLET BY MOUTH DAILY 09/26/13   Debbrah Alar, NP  nystatin ointment (MYCOSTATIN) Apply topically 2 (two) times daily. Apply twice daily to buttocks/rash until healed 09/01/12   Debbrah Alar, NP  omeprazole (PRILOSEC) 20 MG capsule Take 20 mg by mouth daily. For reflux     Historical Provider, MD  Silodosin (RAPAFLO PO) Take 8 mg by mouth daily. To help with urinary flow 09/11/11   Milana Na, MD  simvastatin (ZOCOR) 20 MG tablet TAKE ONE TABLET BY MOUTH AT  BEDTIME 04/18/13   Debbrah Alar, NP  traMADol (ULTRAM) 50 MG tablet Take 1 tablet (50 mg total) by mouth every 8 (eight) hours as needed for pain. 01/26/13   Debbrah Alar, NP   BP 124/56  Pulse 58  Temp(Src) 97.8 F (36.6 C) (Oral)  Resp 20  Ht 5\' 8"  (1.727 m)  Wt 171 lb (77.565 kg)  BMI 26.01 kg/m2  SpO2 97% Physical Exam  Nursing note and vitals reviewed. Constitutional: He is oriented to person, place, and time. He appears well-developed and well-nourished.  HENT:  Head: Normocephalic and atraumatic.  Eyes: Conjunctivae and EOM are normal. Pupils are equal, round, and reactive to light.  Neck: Normal range of motion. Neck supple.  Cardiovascular: Normal rate and regular rhythm.   Pulmonary/Chest: Effort normal and breath sounds normal.  Abdominal: Soft. Bowel sounds are normal.  Musculoskeletal:       Cervical back: Normal.       Thoracic back: Normal.       Lumbar back: Normal.  Swelling noted to the left shoulder. Pt unable to raise the shoulder  Neurological: He is alert and oriented to person, place, and time.  Skin: Skin is warm and dry.  Psychiatric: He has a normal mood and affect.    ED Course  Procedures (including critical care time) Labs Review Labs Reviewed - No data to display  Imaging Review Ct Head Wo Contrast  10/03/2013   CLINICAL DATA:  Patient fell today. The patient is on Coumadin. Right shoulder fracture.  EXAM: CT HEAD WITHOUT CONTRAST  TECHNIQUE: Contiguous axial images were obtained from the base of the skull through the vertex without intravenous contrast.  COMPARISON:  CT scan dated 07/29/2010  FINDINGS: No mass lesion. No midline shift. No acute hemorrhage or hematoma. No extra-axial fluid collections. No evidence of acute infarction. There is diffuse cerebral cortical and cerebellar atrophy. There is secondary slight ventricular dilatation. No acute osseous abnormality.  IMPRESSION: No acute intracranial abnormality.  Chronic atrophy.    Electronically Signed   By: Rozetta Nunnery M.D.   On: 10/03/2013 16:19   Dg Shoulder Left  10/03/2013   CLINICAL DATA:  Pain post trauma  EXAM: LEFT SHOULDER - 2+ VIEW  COMPARISON:  None.  FINDINGS: Frontal and Y scapular views were obtained. There is a comminuted fracture of the proximal humerus with avulsion of portions of the greater tuberosity. Fracture extends into the lesser tuberosity, although frank avulsion in this area cannot be confirmed. There is impaction at the fracture site at the level of the proximal metaphysis. No dislocation.  There is moderate generalized osteoarthritic change. There is bony overgrowth of the distal clavicle.  IMPRESSION: Comminuted fracture proximal humerus with a degree of impaction. Moderate osteoarthritic change. Bony overgrowth distal clavicle. No dislocation.   Electronically Signed   By: Lowella Grip M.D.   On: 10/03/2013 14:49     EKG Interpretation None      MDM   Final diagnoses:  Proximal humerus fracture  Fall    No injury to brain noted on ct. Pt placed in sling and given follow up with Dr Barbaraann Barthel and a script for vicodin. Pt is not taking tramadol at this time. Pt is neurologically intact.discussed findings with pt and daughter.    Glendell Docker, NP 10/03/13 [phone]

## 2013-10-03 NOTE — ED Notes (Signed)
slippped and fell in his house while walking without his walker. Injury to his left shoulder.

## 2013-10-03 NOTE — Discharge Instructions (Signed)
Humerus Fracture, Treated with Immobilization The humerus is the large bone in your upper arm. You have a broken (fractured) humerus. These fractures are easily diagnosed with X-rays. TREATMENT  Simple fractures which will heal without disability are treated with simple immobilization. Immobilization means you will wear a cast, splint, or sling. You have a fracture which will do well with immobilization. The fracture will heal well simply by being held in a good position until it is stable enough to begin range of motion exercises. Do not take part in activities which would further injure your arm.  HOME CARE INSTRUCTIONS   Put ice on the injured area.  Put ice in a plastic bag.  Place a towel between your skin and the bag.  Leave the ice on for 15-20 minutes, 03-04 times a day.  If you have a cast:  Do not scratch the skin under the cast using sharp or pointed objects.  Check the skin around the cast every day. You may put lotion on any red or sore areas.  Keep your cast dry and clean.  If you have a splint:  Wear the splint as directed.  Keep your splint dry and clean.  You may loosen the elastic around the splint if your fingers become numb, tingle, or turn cold or blue.  If you have a sling:  Wear the sling as directed.  Do not put pressure on any part of your cast or splint until it is fully hardened.  Your cast or splint can be protected during bathing with a plastic bag. Do not lower the cast or splint into water.  Only take over-the-counter or prescription medicines for pain, discomfort, or fever as directed by your caregiver.  Do range of motion exercises as instructed by your caregiver.  Follow up as directed by your caregiver. This is very important in order to avoid permanent injury or disability and chronic pain. SEEK IMMEDIATE MEDICAL CARE IF:   Your skin or nails in the injured arm turn blue or gray.  Your arm feels cold or numb.  You develop severe  pain in the injured arm.  You are having problems with the medicines you were given. MAKE SURE YOU:   Understand these instructions.  Will watch your condition.  Will get help right away if you are not doing well or get worse. Document Released: 08/31/2000 Document Revised: 08/17/2011 Document Reviewed: 07/09/2010 [facility] Patient Information 2014 [place].

## 2013-10-04 NOTE — ED Provider Notes (Signed)
History/physical exam/procedure(s) were performed by non-physician practitioner and as supervising physician I was immediately available for consultation/collaboration. I have reviewed all notes and am in agreement with care and plan.   Shaune Pollack, MD 10/04/13 [phone]

## 2013-10-09 ENCOUNTER — Other Ambulatory Visit: Payer: Self-pay | Admitting: Family

## 2013-10-09 ENCOUNTER — Other Ambulatory Visit: Payer: Self-pay | Admitting: Cardiology

## 2013-10-09 NOTE — Telephone Encounter (Signed)
Pt has f/u on 10/13/13.  Please advise refill:  Medication name:  Name from pharmacy:  LORazepam (ATIVAN) 1 MG tablet  LORAZEPAM 1MG  TABLETS Sig: TAKE 1/2 TABLET BY MOUTH EVERY DAY AS NEEDED. Dispense: 30 tablet Refills: 0 Start: 10/09/2013 Class: Normal Requested on: 10/09/2013 Originally ordered on: 08/29/2010 Last refill: 08/08/2013

## 2013-10-09 NOTE — Telephone Encounter (Signed)
Ok to send 30 tabs zero refills. 

## 2013-10-10 ENCOUNTER — Encounter: Payer: Self-pay | Admitting: Family Medicine

## 2013-10-10 ENCOUNTER — Encounter (INDEPENDENT_AMBULATORY_CARE_PROVIDER_SITE_OTHER): Payer: Self-pay

## 2013-10-10 ENCOUNTER — Ambulatory Visit (INDEPENDENT_AMBULATORY_CARE_PROVIDER_SITE_OTHER): Payer: Medicare Other | Admitting: Family Medicine

## 2013-10-10 VITALS — BP 137/68 | HR 60 | Ht 68.0 in | Wt 170.0 lb

## 2013-10-10 DIAGNOSIS — S42209A Unspecified fracture of upper end of unspecified humerus, initial encounter for closed fracture: Secondary | ICD-10-CM

## 2013-10-10 DIAGNOSIS — S42302A Unspecified fracture of shaft of humerus, left arm, initial encounter for closed fracture: Secondary | ICD-10-CM

## 2013-10-10 DIAGNOSIS — S42309A Unspecified fracture of shaft of humerus, unspecified arm, initial encounter for closed fracture: Secondary | ICD-10-CM

## 2013-10-10 DIAGNOSIS — S42202A Unspecified fracture of upper end of left humerus, initial encounter for closed fracture: Secondary | ICD-10-CM

## 2013-10-10 MED ORDER — TRAMADOL HCL 50 MG PO TABS: 50.0000 mg | ORAL_TABLET | Freq: Three times a day (TID) | ORAL | Status: DC | PRN

## 2013-10-10 NOTE — Patient Instructions (Signed)
You have a proximal humerus fracture. Use sling regularly including when you sleep. Ice 15 minutes at a time as often as possible. Tramadol three times a day as needed for pain. Ok to take tylenol in addition to this at the same time (1 extra strength tablet). Follow up with me in 4 weeks. We will consider repeating x-rays at that visit.

## 2013-10-10 NOTE — Telephone Encounter (Signed)
Rx called to pharmacy voicemail. 

## 2013-10-11 ENCOUNTER — Encounter: Payer: Self-pay | Admitting: Family Medicine

## 2013-10-11 DIAGNOSIS — S42202A Unspecified fracture of upper end of left humerus, initial encounter for closed fracture: Secondary | ICD-10-CM | POA: Insufficient documentation

## 2013-10-11 LAB — BASIC METABOLIC PANEL
BUN: 19 mg/dL (ref 6–23)
CALCIUM: 8.7 mg/dL (ref 8.4–10.5)
CO2: 30 mEq/L (ref 19–32)
CREATININE: 0.85 mg/dL (ref 0.50–1.35)
Chloride: 93 mEq/L — ABNORMAL LOW (ref 96–112)
GLUCOSE: 130 mg/dL — AB (ref 70–99)
POTASSIUM: 5 meq/L (ref 3.5–5.3)
Sodium: 133 mEq/L — ABNORMAL LOW (ref 135–145)

## 2013-10-11 LAB — HEPATIC FUNCTION PANEL
ALBUMIN: 3.4 g/dL — AB (ref 3.5–5.2)
ALT: 29 U/L (ref 0–53)
AST: 29 U/L (ref 0–37)
Alkaline Phosphatase: 107 U/L (ref 39–117)
BILIRUBIN TOTAL: 1.1 mg/dL (ref 0.2–1.2)
Bilirubin, Direct: 0.4 mg/dL — ABNORMAL HIGH (ref 0.0–0.3)
Indirect Bilirubin: 0.7 mg/dL (ref 0.2–1.2)
Total Protein: 5.9 g/dL — ABNORMAL LOW (ref 6.0–8.3)

## 2013-10-11 NOTE — Progress Notes (Signed)
Patient ID: [name], male   DOB: 21-Nov-1927, 80 y.o.   MRN: [id number]  PCP: Nance Pear., NP  Subjective:   HPI: Patient is a 80 y.o. male here for left arm fracture.  Patient reports on 4/28 when walking his feet got tangled and he fell down directly onto his left shoulder. No loss of consciousness. No dizziness, chest pain prior to the fall. Severe pain and a lot of swelling, bruising of left shoulder following this. Went to ED where x-rays showed a comminuted proximal humerus fracture. He is a very poor surgical candidate with his comorbidities. Tried oxycodone but it made him nauseous - able to tolerate tramadol, tylenol.  Past Medical History  Diagnosis Date  . CAD (coronary artery disease)   . COPD (chronic obstructive pulmonary disease)   . C. difficile colitis     recurrent diarrhea  . Hyperlipidemia   . HTN (hypertension)   . Osteoarthritis   . BPH (benign prostatic hypertrophy)   . Low back pain   . Lumbar disc disease   . CHF (congestive heart failure)   . Permanent atrial fibrillation   . Colonic polyp   . Diverticulosis of colon   . Anxiety   . GERD (gastroesophageal reflux disease)   . PVD (peripheral vascular disease)   . TIA (transient ischemic attack)   . Diarrhea     recurrent diarrhea  . Tachy-brady syndrome   . Abdominal aneurysm     Current Outpatient Prescriptions on File Prior to Visit  Medication Sig Dispense Refill  . albuterol (PROVENTIL) (2.5 MG/3ML) 0.083% nebulizer solution Take 3 mLs (2.5 mg total) by nebulization every 6 (six) hours as needed for wheezing or shortness of breath.  150 mL  1  . amLODipine (NORVASC) 5 MG tablet TAKE 1 TABLET BY MOUTH DAILY  30 tablet  0  . carvedilol (COREG) 25 MG tablet TAKE 1 TABLET BY MOUTH TWICE DAILY  60 tablet  0  . colchicine 0.6 MG tablet One po bid prn gout      . COUMADIN 5 MG tablet USE AS DIRECTED BY ANTICOAGULANT CLINIC  40 tablet  3  . [place]IG[place] 125 MCG tablet TAKE 1 TABLET BY MOUTH  EVERY DAY  30 tablet  0  . finasteride (PROSCAR) 5 MG tablet Take 1 tablet (5 mg total) by mouth daily.  30 tablet  5  . furosemide (LASIX) 20 MG tablet Take 1 1/2 to 2 tablets daily for fluid      . HYDROcodone-acetaminophen (NORCO/VICODIN) 5-325 MG per tablet Take 1 tablet by mouth every 4 (four) hours as needed.  10 tablet  0  . LORazepam (ATIVAN) 1 MG tablet TAKE 1/2 TABLET BY MOUTH EVERY DAY AS NEEDED.  30 tablet  0  . losartan (COZAAR) 50 MG tablet TAKE 1 TABLET BY MOUTH DAILY  30 tablet  0  . nystatin ointment (MYCOSTATIN) Apply topically 2 (two) times daily. Apply twice daily to buttocks/rash until healed  30 g  1  . omeprazole (PRILOSEC) 20 MG capsule Take 20 mg by mouth daily. For reflux       . Silodosin (RAPAFLO PO) Take 8 mg by mouth daily. To help with urinary flow      . simvastatin (ZOCOR) 20 MG tablet TAKE ONE TABLET BY MOUTH AT BEDTIME  90 tablet  1   No current facility-administered medications on file prior to visit.    Past Surgical History  Procedure Laterality Date  . Coronary artery bypass graft  1993  . Pacemaker placement  1992 and 2005    permanent and new placement 2005  . Abdominal aortic aneurysm repair      with intraluminal graft  . Aortic valve replacement      s/p   . Retinal detachment surgery      repair of right eye 9/211- Dr Zadie Rhine    No Known Allergies  History   Social History  . Marital Status: Widowed    Spouse Name: N/A    Number of Children: N/A  . Years of Education: N/A   Occupational History  . Not on file.   Social History Main Topics  . Smoking status: Former Research scientist (life sciences)  . Smokeless tobacco: Current User    Types: Chew  . Alcohol Use: No  . Drug Use: Not on file  . Sexual Activity: Not on file   Other Topics Concern  . Not on file   Social History Narrative   Former Smoker     Alcohol use-no   Widowed                Family History  Problem Relation Age of Onset  . Hypertension Mother   . Stroke Father     died  of stroke  . Stroke Sister     died of stroke  . Cancer Other     wife diagnosed with advanced bladder cancer-deceased    BP 137/68  Pulse 60  Ht 5\' 8"  (1.727 m)  Wt 170 lb (77.111 kg)  BMI 25.85 kg/m2  Review of Systems: See HPI above.    Objective:  Physical Exam:  Gen: NAD  Left shoulder: Bruising, swelling. Kept in a sling for today's exam. Tenderness proximal upper arm, shoulder. ROM not tested. Able to oppose thumb, abduct and flex/extend all digits. NVI distally.    Assessment & Plan:  1. Left comminuted proximal humerus fracture - patient is a poor surgical candidate and would recommend conservative treatment.  Sling for next 6 weeks at least.  Icing, tramadol, tylenol.  F/u in 4 weeks and repeat radiographs at that time - if pain improved and callus formation present can start him in physical therapy - anticipate he will need this following immobilization.

## 2013-10-11 NOTE — Assessment & Plan Note (Signed)
Left comminuted proximal humerus fracture - patient is a poor surgical candidate and would recommend conservative treatment.  Sling for next 6 weeks at least.  Icing, tramadol, tylenol.  F/u in 4 weeks and repeat radiographs at that time - if pain improved and callus formation present can start him in physical therapy - anticipate he will need this following immobilization.

## 2013-10-13 ENCOUNTER — Encounter: Payer: Self-pay | Admitting: Family

## 2013-10-13 ENCOUNTER — Ambulatory Visit: Payer: Medicare Other | Admitting: Family

## 2013-10-16 ENCOUNTER — Ambulatory Visit (INDEPENDENT_AMBULATORY_CARE_PROVIDER_SITE_OTHER): Payer: Medicare Other | Admitting: Pharmacist

## 2013-10-16 DIAGNOSIS — Z8679 Personal history of other diseases of the circulatory system: Secondary | ICD-10-CM

## 2013-10-16 DIAGNOSIS — I4891 Unspecified atrial fibrillation: Secondary | ICD-10-CM

## 2013-10-16 DIAGNOSIS — I359 Nonrheumatic aortic valve disorder, unspecified: Secondary | ICD-10-CM

## 2013-10-16 DIAGNOSIS — Z954 Presence of other heart-valve replacement: Secondary | ICD-10-CM

## 2013-10-16 DIAGNOSIS — Z5181 Encounter for therapeutic drug level monitoring: Secondary | ICD-10-CM

## 2013-10-16 LAB — POCT INR: INR: 2.1

## 2013-10-18 ENCOUNTER — Encounter: Payer: Self-pay | Admitting: *Deleted

## 2013-10-23 ENCOUNTER — Other Ambulatory Visit: Payer: Self-pay | Admitting: Family

## 2013-10-31 ENCOUNTER — Ambulatory Visit (INDEPENDENT_AMBULATORY_CARE_PROVIDER_SITE_OTHER): Payer: Medicare Other | Admitting: Pharmacist Clinician (PhC)/ Clinical Pharmacy Specialist

## 2013-10-31 ENCOUNTER — Other Ambulatory Visit: Payer: Self-pay | Admitting: Internal Medicine

## 2013-10-31 ENCOUNTER — Other Ambulatory Visit: Payer: Self-pay | Admitting: Family

## 2013-10-31 DIAGNOSIS — I359 Nonrheumatic aortic valve disorder, unspecified: Secondary | ICD-10-CM

## 2013-10-31 DIAGNOSIS — Z954 Presence of other heart-valve replacement: Secondary | ICD-10-CM

## 2013-10-31 DIAGNOSIS — Z5181 Encounter for therapeutic drug level monitoring: Secondary | ICD-10-CM

## 2013-10-31 DIAGNOSIS — I4891 Unspecified atrial fibrillation: Secondary | ICD-10-CM

## 2013-10-31 DIAGNOSIS — Z8679 Personal history of other diseases of the circulatory system: Secondary | ICD-10-CM

## 2013-10-31 LAB — POCT INR: INR: 3.6

## 2013-11-06 ENCOUNTER — Other Ambulatory Visit: Payer: Self-pay | Admitting: Cardiology

## 2013-11-07 ENCOUNTER — Telehealth: Payer: Self-pay | Admitting: *Deleted

## 2013-11-07 MED ORDER — DIGOXIN 125 MCG PO TABS: ORAL_TABLET | ORAL | Status: DC

## 2013-11-07 NOTE — Telephone Encounter (Signed)
Received message from pt's daughter that [facility] was supposed to have sent [place] a request for digoxin 3 days ago and they said we have not responded.  Upon review of chart I see no incoming request for digoxin. Refill sent. Notified pt's daughter. FYI: she states pt fell recently and broke his arm in 3 places.

## 2013-11-08 ENCOUNTER — Ambulatory Visit (HOSPITAL_BASED_OUTPATIENT_CLINIC_OR_DEPARTMENT_OTHER)
Admission: RE | Admit: 2013-11-08 | Discharge: 2013-11-08 | Disposition: A | Discharge status: Home / Self Care | Payer: Medicare Other | Source: Ambulatory Visit | Attending: Family Medicine | Admitting: Family Medicine

## 2013-11-08 ENCOUNTER — Ambulatory Visit (INDEPENDENT_AMBULATORY_CARE_PROVIDER_SITE_OTHER): Payer: Medicare Other | Admitting: Family Medicine

## 2013-11-08 ENCOUNTER — Encounter: Payer: Self-pay | Admitting: Family Medicine

## 2013-11-08 VITALS — BP 116/65 | HR 60 | Ht 68.0 in | Wt 170.0 lb

## 2013-11-08 DIAGNOSIS — S42309A Unspecified fracture of shaft of humerus, unspecified arm, initial encounter for closed fracture: Secondary | ICD-10-CM

## 2013-11-08 DIAGNOSIS — Z4789 Encounter for other orthopedic aftercare: Secondary | ICD-10-CM | POA: Insufficient documentation

## 2013-11-08 DIAGNOSIS — S42302A Unspecified fracture of shaft of humerus, left arm, initial encounter for closed fracture: Secondary | ICD-10-CM

## 2013-11-08 DIAGNOSIS — S4992XA Unspecified injury of left shoulder and upper arm, initial encounter: Secondary | ICD-10-CM

## 2013-11-08 DIAGNOSIS — S46909A Unspecified injury of unspecified muscle, fascia and tendon at shoulder and upper arm level, unspecified arm, initial encounter: Secondary | ICD-10-CM

## 2013-11-08 DIAGNOSIS — S42209A Unspecified fracture of upper end of unspecified humerus, initial encounter for closed fracture: Secondary | ICD-10-CM

## 2013-11-08 DIAGNOSIS — S42202A Unspecified fracture of upper end of left humerus, initial encounter for closed fracture: Secondary | ICD-10-CM

## 2013-11-08 DIAGNOSIS — S4980XA Other specified injuries of shoulder and upper arm, unspecified arm, initial encounter: Secondary | ICD-10-CM

## 2013-11-08 DIAGNOSIS — M109 Gout, unspecified: Secondary | ICD-10-CM

## 2013-11-08 MED ORDER — TRAMADOL HCL 50 MG PO TABS: 50.0000 mg | ORAL_TABLET | Freq: Three times a day (TID) | ORAL | Status: AC | PRN

## 2013-11-08 MED ORDER — COLCHICINE 0.6 MG PO TABS: 0.6000 mg | ORAL_TABLET | Freq: Two times a day (BID) | ORAL | Status: AC

## 2013-11-08 NOTE — Telephone Encounter (Signed)
Note forwarded to scheduler/SLS

## 2013-11-08 NOTE — Patient Instructions (Signed)
We will arrange for home therapy to help regain motion and strength in this shoulder. Use sling regularly including when you sleep for next 2 weeks then use only as needed. Ice 15 minutes at a time as often as possible. Tramadol three times a day as needed for pain. Ok to take tylenol in addition to this at the same time (1 extra strength tablet). Ibuprofen 600-800mg  three times a day for 1 week then as needed for gout. Take colchicine also 0.6mg  tablet twice a day for 1 week then back down to once a day until pain in your hand and wrist resolves. Follow up with me in 4 weeks.

## 2013-11-08 NOTE — Telephone Encounter (Signed)
Please contact pt and arrange follow up visit.

## 2013-11-08 NOTE — Telephone Encounter (Signed)
Patient has appointment scheduled for 12/04/13. Do you want him to come in sooner?

## 2013-11-08 NOTE — Telephone Encounter (Signed)
Follow up in next 2 weeks pls.

## 2013-11-09 NOTE — Telephone Encounter (Signed)
Appointment scheduled for 11/17/13

## 2013-11-12 ENCOUNTER — Other Ambulatory Visit: Payer: Self-pay | Admitting: Family

## 2013-11-13 ENCOUNTER — Encounter: Payer: Self-pay | Admitting: Family Medicine

## 2013-11-13 NOTE — Assessment & Plan Note (Signed)
Radiographs show some interval healing, callus formation.  Continue using sling for 2 more weeks.  Icing, tramadol, tylenol.  Since he now has callus will start home therapy to help him regain motion then eventually work on strengthening.  F/u in 4 weeks.

## 2013-11-13 NOTE — Progress Notes (Signed)
Patient ID: [name], male   DOB: 06/22/1927, 80 y.o.   MRN: [id number]  PCP: Nance Pear., NP  Subjective:   HPI: Patient is a 80 y.o. male here for left arm fracture.  5/5: Patient reports on 4/28 when walking his feet got tangled and he fell down directly onto his left shoulder. No loss of consciousness. No dizziness, chest pain prior to the fall. Severe pain and a lot of swelling, bruising of left shoulder following this. Went to ED where x-rays showed a comminuted proximal humerus fracture. He is a very poor surgical candidate with his comorbidities. Tried oxycodone but it made him nauseous - able to tolerate tramadol, tylenol.  6/3: Patient reports overall he is better. Pain down to 4/10. Has been using sling at all times. Taking tramadol, tylenol as needed.  Past Medical History  Diagnosis Date  . CAD (coronary artery disease)   . COPD (chronic obstructive pulmonary disease)   . C. difficile colitis     recurrent diarrhea  . Hyperlipidemia   . HTN (hypertension)   . Osteoarthritis   . BPH (benign prostatic hypertrophy)   . Low back pain   . Lumbar disc disease   . CHF (congestive heart failure)   . Permanent atrial fibrillation   . Colonic polyp   . Diverticulosis of colon   . Anxiety   . GERD (gastroesophageal reflux disease)   . PVD (peripheral vascular disease)   . TIA (transient ischemic attack)   . Diarrhea     recurrent diarrhea  . Tachy-brady syndrome   . Abdominal aneurysm     Current Outpatient Prescriptions on File Prior to Visit  Medication Sig Dispense Refill  . amLODipine (NORVASC) 5 MG tablet TAKE 1 TABLET BY MOUTH DAILY  30 tablet  1  . carvedilol (COREG) 25 MG tablet TAKE 1 TABLET BY MOUTH TWICE DAILY  60 tablet  0  . COUMADIN 5 MG tablet USE AS DIRECTED BY ANTICOAGULANT CLINIC  40 tablet  3  . digoxin (DIGOX) 0.125 MG tablet TAKE 1 TABLET BY MOUTH EVERY DAY  30 tablet  3  . finasteride (PROSCAR) 5 MG tablet TAKE 1 TABLET BY  MOUTH DAILY  30 tablet  1  . furosemide (LASIX) 20 MG tablet Take 1 1/2 to 2 tablets daily for fluid      . LORazepam (ATIVAN) 1 MG tablet TAKE 1/2 TABLET BY MOUTH EVERY DAY AS NEEDED.  30 tablet  0  . losartan (COZAAR) 50 MG tablet TAKE 1 TABLET BY MOUTH DAILY  30 tablet  0  . nystatin ointment (MYCOSTATIN) Apply topically 2 (two) times daily. Apply twice daily to buttocks/rash until healed  30 g  1  . omeprazole (PRILOSEC) 20 MG capsule Take 20 mg by mouth daily. For reflux       . Silodosin (RAPAFLO PO) Take 8 mg by mouth daily. To help with urinary flow      . simvastatin (ZOCOR) 20 MG tablet TAKE 1 TABLET BY MOUTH AT BEDTIME  90 tablet  1   No current facility-administered medications on file prior to visit.    Past Surgical History  Procedure Laterality Date  . Coronary artery bypass graft  1993  . Pacemaker placement  1992 and 2005    permanent and new placement 2005  . Abdominal aortic aneurysm repair      with intraluminal graft  . Aortic valve replacement      s/p   . Retinal detachment surgery  repair of right eye 9/211- Dr Zadie Rhine    No Known Allergies  History   Social History  . Marital Status: Widowed    Spouse Name: N/A    Number of Children: N/A  . Years of Education: N/A   Occupational History  . Not on file.   Social History Main Topics  . Smoking status: Former Research scientist (life sciences)  . Smokeless tobacco: Current User    Types: Chew  . Alcohol Use: No  . Drug Use: Not on file  . Sexual Activity: Not on file   Other Topics Concern  . Not on file   Social History Narrative   Former Smoker     Alcohol use-no   Widowed                Family History  Problem Relation Age of Onset  . Hypertension Mother   . Stroke Father     died of stroke  . Stroke Sister     died of stroke  . Cancer Other     wife diagnosed with advanced bladder cancer-deceased    BP 116/65  Pulse 60  Ht 5\' 8"  (1.727 m)  Wt 170 lb (77.111 kg)  BMI 25.85 kg/m2  Review of  Systems: See HPI above.    Objective:  Physical Exam:  Gen: NAD  Left shoulder: Bruising, swelling though has improved. Tenderness proximal upper arm, shoulder. ROM not tested. Able to oppose thumb, abduct and flex/extend all digits. NVI distally.    Assessment & Plan:  1. Left comminuted proximal humerus fracture - Radiographs show some interval healing, callus formation.  Continue using sling for 2 more weeks.  Icing, tramadol, tylenol.  Since he now has callus will start home therapy to help him regain motion then eventually work on strengthening.  F/u in 4 weeks.  Note:  As an aside he asked about the swelling, redness he is getting in left wrist - this is warm to the touch but not red and does appear more swollen than right wrist.  Consistent with prior gout attacks.  Will start ibuprofen and colchicine.

## 2013-11-15 ENCOUNTER — Ambulatory Visit (INDEPENDENT_AMBULATORY_CARE_PROVIDER_SITE_OTHER): Payer: Medicare Other | Admitting: Family

## 2013-11-15 ENCOUNTER — Other Ambulatory Visit: Payer: Self-pay | Admitting: Family

## 2013-11-15 ENCOUNTER — Encounter: Payer: Self-pay | Admitting: Family

## 2013-11-15 ENCOUNTER — Telehealth: Payer: Self-pay | Admitting: Family

## 2013-11-15 VITALS — BP 110/60 | HR 59 | Temp 98.1°F | Resp 16 | Ht 67.0 in | Wt 161.1 lb

## 2013-11-15 DIAGNOSIS — S42209A Unspecified fracture of upper end of unspecified humerus, initial encounter for closed fracture: Secondary | ICD-10-CM

## 2013-11-15 DIAGNOSIS — R739 Hyperglycemia, unspecified: Secondary | ICD-10-CM

## 2013-11-15 DIAGNOSIS — L989 Disorder of the skin and subcutaneous tissue, unspecified: Secondary | ICD-10-CM

## 2013-11-15 DIAGNOSIS — E785 Hyperlipidemia, unspecified: Secondary | ICD-10-CM

## 2013-11-15 DIAGNOSIS — R634 Abnormal weight loss: Secondary | ICD-10-CM

## 2013-11-15 DIAGNOSIS — S42202A Unspecified fracture of upper end of left humerus, initial encounter for closed fracture: Secondary | ICD-10-CM

## 2013-11-15 DIAGNOSIS — R7309 Other abnormal glucose: Secondary | ICD-10-CM

## 2013-11-15 DIAGNOSIS — I10 Essential (primary) hypertension: Secondary | ICD-10-CM

## 2013-11-15 DIAGNOSIS — M109 Gout, unspecified: Secondary | ICD-10-CM

## 2013-11-15 LAB — BASIC METABOLIC PANEL WITH GFR
BUN: 13 mg/dL (ref 6–23)
CHLORIDE: 97 meq/L (ref 96–112)
CO2: 28 meq/L (ref 19–32)
CREATININE: 0.85 mg/dL (ref 0.50–1.35)
Calcium: 8.9 mg/dL (ref 8.4–10.5)
GFR, EST NON AFRICAN AMERICAN: 80 mL/min
GFR, Est African American: >89 mL/min
Glucose, Bld: 137 mg/dL — ABNORMAL HIGH (ref 70–99)
POTASSIUM: 4.1 meq/L (ref 3.5–5.3)
Sodium: 136 mEq/L (ref 135–145)

## 2013-11-15 LAB — HEMOGLOBIN A1C
Hgb A1c MFr Bld: 6.6 % — ABNORMAL HIGH (ref <5.7)
Mean Plasma Glucose: 143 mg/dL — ABNORMAL HIGH (ref <117)

## 2013-11-15 LAB — HEPATIC FUNCTION PANEL
ALBUMIN: 3.6 g/dL (ref 3.5–5.2)
ALT: 33 U/L (ref 0–53)
AST: 38 U/L — AB (ref 0–37)
Alkaline Phosphatase: 161 U/L — ABNORMAL HIGH (ref 39–117)
BILIRUBIN INDIRECT: 0.8 mg/dL (ref 0.2–1.2)
BILIRUBIN TOTAL: 1 mg/dL (ref 0.2–1.2)
Bilirubin, Direct: 0.2 mg/dL (ref 0.0–0.3)
Total Protein: 6.5 g/dL (ref 6.0–8.3)

## 2013-11-15 LAB — TSH: TSH: 1.408 u[IU]/mL (ref 0.350–4.500)

## 2013-11-15 LAB — URIC ACID: URIC ACID, SERUM: 6 mg/dL (ref 4.0–7.8)

## 2013-11-15 NOTE — Assessment & Plan Note (Signed)
Daughter notes appetite has been less recently.  Suspect likely to tramadol and pain. I have asked daughter to keep a close eye on his weight and let us know if he has any further weight loss.  He does drink ensure.  Obtain TSH to rule out hyperthyroid as cause for weight loss.

## 2013-11-15 NOTE — Patient Instructions (Signed)
Please complete lab work prior to leaving. Follow up in 8  weeks, sooner if problems or concerns. Weigh daily- call if more than 3 pound weight loss.

## 2013-11-15 NOTE — Assessment & Plan Note (Signed)
Check A1C 

## 2013-11-15 NOTE — Assessment & Plan Note (Signed)
In sling, management per Dr. Barbaraann Barthel.

## 2013-11-15 NOTE — Telephone Encounter (Signed)
Notified [name]. She states pt will not continue therapy if he has to leave home to do it. They will keep follow up with Dr Barbaraann Barthel on 12/06/13. She states they will talk with them about problem with therapy.

## 2013-11-15 NOTE — Progress Notes (Signed)
Subjective:    Patient ID: [name], male    DOB: 12-Dec-1927, 80 y.o.   MRN: [id number]  HPI  [name] is an 80 yr old male who presents today for follow up.  1) L Arm fracture- pt had an accidental fall on 4/28.  Found to have a comminuted proximal humerus fracture.  He is following with Dr. Barbaraann Barthel with conservative management (sling). Reports that he recently had gout flare in his left wrist. Resolved with dose of allopurinol and now on once daily colcrys.   2) HTN-  Denies CP or SOB  BP Readings from Last 3 Encounters:  11/15/13 110/60  11/08/13 116/65  10/10/13 137/68   3) Skin problem- would like referral to dermatology to address lesion on his scalp.  Wt Readings from Last 3 Encounters:  11/15/13 161 lb 1.3 oz (73.065 kg)  11/08/13 170 lb (77.111 kg)  10/10/13 170 lb (77.111 kg)    Review of Systems See HPI  He denies nausea or vomitting.    Past Medical History  Diagnosis Date  . CAD (coronary artery disease)   . COPD (chronic obstructive pulmonary disease)   . C. difficile colitis     recurrent diarrhea  . Hyperlipidemia   . HTN (hypertension)   . Osteoarthritis   . BPH (benign prostatic hypertrophy)   . Low back pain   . Lumbar disc disease   . CHF (congestive heart failure)   . Permanent atrial fibrillation   . Colonic polyp   . Diverticulosis of colon   . Anxiety   . GERD (gastroesophageal reflux disease)   . PVD (peripheral vascular disease)   . TIA (transient ischemic attack)   . Diarrhea     recurrent diarrhea  . Tachy-brady syndrome   . Abdominal aneurysm     History   Social History  . Marital Status: Widowed    Spouse Name: N/A    Number of Children: N/A  . Years of Education: N/A   Occupational History  . Not on file.   Social History Main Topics  . Smoking status: Former Research scientist (life sciences)  . Smokeless tobacco: Current User    Types: Chew  . Alcohol Use: No  . Drug Use: Not on file  . Sexual Activity: Not on file   Other  Topics Concern  . Not on file   Social History Narrative   Former Smoker     Alcohol use-no   Widowed                Past Surgical History  Procedure Laterality Date  . Coronary artery bypass graft  1993  . Pacemaker placement  1992 and 2005    permanent and new placement 2005  . Abdominal aortic aneurysm repair      with intraluminal graft  . Aortic valve replacement      s/p   . Retinal detachment surgery      repair of right eye 9/211- Dr Zadie Rhine    Family History  Problem Relation Age of Onset  . Hypertension Mother   . Stroke Father     died of stroke  . Stroke Sister     died of stroke  . Cancer Other     wife diagnosed with advanced bladder cancer-deceased    No Known Allergies  Current Outpatient Prescriptions on File Prior to Visit  Medication Sig Dispense Refill  . albuterol (PROVENTIL) (2.5 MG/3ML) 0.083% nebulizer solution USE 1 VIAL VIA NEBULIZER EVERY 6 HOURS AS  NEEDED FOR WHEEZING OR SHORTNESS OF BREATH  150 mL  1  . amLODipine (NORVASC) 5 MG tablet TAKE 1 TABLET BY MOUTH DAILY  30 tablet  1  . carvedilol (COREG) 25 MG tablet TAKE 1 TABLET BY MOUTH TWICE DAILY  60 tablet  0  . colchicine 0.6 MG tablet Take 1 tablet (0.6 mg total) by mouth 2 (two) times daily.  60 tablet  1  . COUMADIN 5 MG tablet USE AS DIRECTED BY ANTICOAGULANT CLINIC  40 tablet  3  . digoxin (DIGOX) 0.125 MG tablet TAKE 1 TABLET BY MOUTH EVERY DAY  30 tablet  3  . finasteride (PROSCAR) 5 MG tablet TAKE 1 TABLET BY MOUTH DAILY  30 tablet  1  . furosemide (LASIX) 20 MG tablet Take 1 1/2 to 2 tablets daily for fluid      . LORazepam (ATIVAN) 1 MG tablet TAKE 1/2 TABLET BY MOUTH EVERY DAY AS NEEDED.  30 tablet  0  . losartan (COZAAR) 50 MG tablet TAKE 1 TABLET BY MOUTH DAILY  30 tablet  0  . nystatin ointment (MYCOSTATIN) Apply topically 2 (two) times daily. Apply twice daily to buttocks/rash until healed  30 g  1  . omeprazole (PRILOSEC) 20 MG capsule Take 20 mg by mouth daily. For reflux        . Silodosin (RAPAFLO PO) Take 8 mg by mouth daily. To help with urinary flow      . simvastatin (ZOCOR) 20 MG tablet TAKE 1 TABLET BY MOUTH AT BEDTIME  90 tablet  1  . traMADol (ULTRAM) 50 MG tablet Take 1 tablet (50 mg total) by mouth every 8 (eight) hours as needed.  90 tablet  0   No current facility-administered medications on file prior to visit.    BP 110/60  Pulse 59  Temp(Src) 98.1 F (36.7 C) (Oral)  Resp 16  Ht 5\' 7"  (1.702 m)  Wt 161 lb 1.3 oz (73.065 kg)  BMI 25.22 kg/m2  SpO2 96%       Objective:   Physical Exam  Constitutional: He is oriented to person, place, and time. He appears well-developed.  Appears thinner than last visit.   HENT:  Head: Normocephalic and atraumatic.  Cardiovascular: Normal rate and regular rhythm.   No murmur heard. Pulmonary/Chest: Effort normal and breath sounds normal. No respiratory distress. He has no wheezes. He has no rales. He exhibits no tenderness.  Musculoskeletal:  1+ bilateral LE edema L arm in sling, no swelling noted  Lymphadenopathy:    He has no cervical adenopathy.  Neurological: He is alert and oriented to person, place, and time.  Skin: Skin is warm and dry.  Small raised lesion left anterior scalp  Psychiatric: He has a normal mood and affect. His behavior is normal. Judgment and thought content normal.          Assessment & Plan:  Scalp lesion- will refer to dermatology for further evaluation.

## 2013-11-15 NOTE — Progress Notes (Signed)
Pre visit review using our clinic review tool, if applicable. No additional management support is needed unless otherwise documented below in the visit note. 

## 2013-11-15 NOTE — Assessment & Plan Note (Signed)
BP stable. Continue current meds.  Obtain bmet.

## 2013-11-15 NOTE — Assessment & Plan Note (Signed)
Resolved clinically. Obtain uric acid level,

## 2013-11-15 NOTE — Telephone Encounter (Signed)
Please contact pt's daughter and notify her that I spoke with Dr. Barbaraann Barthel and he confirmed that his insurance will not cover his home therapy.

## 2013-11-16 ENCOUNTER — Encounter: Payer: Self-pay | Admitting: *Deleted

## 2013-11-17 ENCOUNTER — Encounter: Payer: Self-pay | Admitting: Family

## 2013-11-17 ENCOUNTER — Ambulatory Visit: Payer: Medicare Other | Admitting: Family

## 2013-11-21 ENCOUNTER — Encounter: Payer: Self-pay | Admitting: Family

## 2013-11-22 ENCOUNTER — Ambulatory Visit (INDEPENDENT_AMBULATORY_CARE_PROVIDER_SITE_OTHER): Payer: Medicare Other | Admitting: Family

## 2013-11-22 ENCOUNTER — Ambulatory Visit (INDEPENDENT_AMBULATORY_CARE_PROVIDER_SITE_OTHER): Payer: Medicare Other | Admitting: Pharmacist

## 2013-11-22 ENCOUNTER — Ambulatory Visit (HOSPITAL_COMMUNITY)
Admission: RE | Admit: 2013-11-22 | Discharge: 2013-11-22 | Disposition: A | Discharge status: Home / Self Care | Payer: Medicare Other | Source: Ambulatory Visit | Attending: Family | Admitting: Family

## 2013-11-22 ENCOUNTER — Encounter: Payer: Self-pay | Admitting: Family

## 2013-11-22 VITALS — BP 111/61 | HR 60 | Resp 16 | Ht 68.0 in | Wt 159.0 lb

## 2013-11-22 DIAGNOSIS — Z48812 Encounter for surgical aftercare following surgery on the circulatory system: Secondary | ICD-10-CM | POA: Insufficient documentation

## 2013-11-22 DIAGNOSIS — I714 Abdominal aortic aneurysm, without rupture, unspecified: Secondary | ICD-10-CM

## 2013-11-22 DIAGNOSIS — Z8679 Personal history of other diseases of the circulatory system: Secondary | ICD-10-CM

## 2013-11-22 DIAGNOSIS — Z5181 Encounter for therapeutic drug level monitoring: Secondary | ICD-10-CM

## 2013-11-22 DIAGNOSIS — I359 Nonrheumatic aortic valve disorder, unspecified: Secondary | ICD-10-CM

## 2013-11-22 DIAGNOSIS — I4891 Unspecified atrial fibrillation: Secondary | ICD-10-CM

## 2013-11-22 DIAGNOSIS — Z954 Presence of other heart-valve replacement: Secondary | ICD-10-CM

## 2013-11-22 LAB — POCT INR: INR: 2.9

## 2013-11-22 NOTE — Patient Instructions (Signed)
Abdominal Aortic Aneurysm An aneurysm is a weakened or damaged part of an artery wall that bulges from the normal force of blood pumping through the body. An abdominal aortic aneurysm is an aneurysm that occurs in the lower part of the aorta, the main artery of the body.  The major concern with an abdominal aortic aneurysm is that it can enlarge and burst (rupture) or blood can flow between the layers of the wall of the aorta through a tear (aorticdissection). Both of these conditions can cause bleeding inside the body and can be life threatening unless diagnosed and treated promptly. CAUSES  The exact cause of an abdominal aortic aneurysm is unknown. Some contributing factors are:   A hardening of the arteries caused by the buildup of fat and other substances in the lining of a blood vessel (arteriosclerosis).  Inflammation of the walls of an artery (arteritis).   Connective tissue diseases, such as Marfan syndrome.   Abdominal trauma.   An infection, such as syphilis or staphylococcus, in the wall of the aorta (infectious aortitis) caused by bacteria. RISK FACTORS  Risk factors that contribute to an abdominal aortic aneurysm may include:  Age older than 60 years.   High blood pressure (hypertension).  Male gender.  Ethnicity (white race).  Obesity.  Family history of aneurysm (first degree relatives only).  Tobacco use. PREVENTION  The following healthy lifestyle habits may help decrease your risk of abdominal aortic aneurysm:  Quitting smoking. Smoking can raise your blood pressure and cause arteriosclerosis.  Limiting or avoiding alcohol.  Keeping your blood pressure, blood sugar level, and cholesterol levels within normal limits.  Decreasing your salt intake. In somepeople, too much salt can raise blood pressure and increase your risk of abdominal aortic aneurysm.  Eating a diet low in saturated fats and cholesterol.  Increasing your fiber intake by including  whole grains, vegetables, and fruits in your diet. Eating these foods may help lower blood pressure.  Maintaining a healthy weight.  Staying physically active and exercising regularly. SYMPTOMS  The symptoms of abdominal aortic aneurysm may vary depending on the size and rate of growth of the aneurysm.Most grow slowly and do not have any symptoms. When symptoms do occur, they may include:  Pain (abdomen, side, lower back, or groin). The pain may vary in intensity. A sudden onset of severe pain may indicate that the aneurysm has ruptured.  Feeling full after eating only small amounts of food.  Nausea or vomiting or both.  Feeling a pulsating lump in the abdomen.  Feeling faint or passing out. DIAGNOSIS  Since most unruptured abdominal aortic aneurysms have no symptoms, they are often discovered during diagnostic exams for other conditions. An aneurysm may be found during the following procedures:  Ultrasonography (A one-time screening for abdominal aortic aneurysm by ultrasonography is also recommended for all men aged 65-75 years who have ever smoked).  X-ray exams.  A computed tomography (CT).  Magnetic resonance imaging (MRI).  Angiography or arteriography. TREATMENT  Treatment of an abdominal aortic aneurysm depends on the size of your aneurysm, your age, and risk factors for rupture. Medication to control blood pressure and pain may be used to manage aneurysms smaller than 6 cm. Regular monitoring for enlargement may be recommended by your caregiver if:  The aneurysm is 3-4 cm in size (an annual ultrasonography may be recommended).  The aneurysm is 4-4.5 cm in size (an ultrasonography every 6 months may be recommended).  The aneurysm is larger than 4.5 cm in   size (your caregiver may ask that you be examined by a vascular surgeon). If your aneurysm is larger than 6 cm, surgical repair may be recommended. There are two main methods for repair of an aneurysm:   Endovascular  repair (a minimally invasive surgery). This is done most often.  Open repair. This method is used if an endovascular repair is not possible. Document Released: 03/04/2005 Document Revised: 09/19/2012 Document Reviewed: 06/24/2012 [employer] Patient Information 2015 [employer]. This information is not intended to replace advice given to you by your health care [name]. Make sure you discuss any questions you have with your health care [name].  

## 2013-11-22 NOTE — Progress Notes (Signed)
[facility] [place]  Established [facility]  History of Present Illness  [name] is a 80 y.o. (24-Mar-1928) male patient of Dr. Scot Dock who is s/p  AAA stent repair in March 2001 by Dr. Amedeo Plenty. He returns today for follow up. 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 Has nausea in the last several weeks, has a known gall stone. He denies any history of stroke or TIA. Is wheelchair bound, does not walk due to weakness in legs from his back issues. Daughter states LLE lowr leg has had wounds that heal very slowly at times, not now. Is almost blind from macular degeneration.  Fractured upper left arm recently, was treated with a sling and tramadol for pain. He takes a daily statin and coumadin for aortic valve replacement.  Pt Diabetic: No Pt smoker: stopped smoking at age 77, started at age 15   Past Medical History  Diagnosis Date  . CAD (coronary artery disease)   . COPD (chronic obstructive pulmonary disease)   . C. difficile colitis     recurrent diarrhea  . Hyperlipidemia   . HTN (hypertension)   . Osteoarthritis   . BPH (benign prostatic hypertrophy)   . Low back pain   . Lumbar disc disease   . CHF (congestive heart failure)   . Permanent atrial fibrillation   . Colonic polyp   . Diverticulosis of colon   . Anxiety   . GERD (gastroesophageal reflux disease)   . PVD (peripheral vascular disease)   . TIA (transient ischemic attack)   . Diarrhea     recurrent diarrhea  . Tachy-brady syndrome   . Abdominal aneurysm   . Atrial fibrillation    Past Surgical History  Procedure Laterality Date  . Coronary artery bypass graft  1993  . Pacemaker placement  1992 and 2005    permanent and new placement 2005  . Abdominal aortic aneurysm repair      with intraluminal graft  . Aortic valve replacement      s/p   . Retinal detachment surgery     repair of right eye 9/211- Dr Zadie Rhine   Social History History  Substance Use Topics  . Smoking status: Former Smoker    Quit date: 11/23/1991  . Smokeless tobacco: Current User    Types: Chew  . Alcohol Use: No   Family History Family History  Problem Relation Age of Onset  . Hypertension Mother   . Cancer Mother   . Heart disease Mother     Before age 56  . Hyperlipidemia Mother   . Stroke Father     died of stroke  . Stroke Sister     died of stroke  . Deep vein thrombosis Sister     Amputation of Leg  . Hypertension Sister   . Peripheral vascular disease Sister   . Cancer Other     wife diagnosed with advanced bladder cancer-deceased  . Cancer Brother     Prostate  . Hypertension Brother   . Diabetes Daughter   . Hypertension Daughter   . Varicose Veins Daughter    Current Outpatient Prescriptions on File Prior to Visit  Medication Sig Dispense Refill  . albuterol (PROVENTIL) (2.5 MG/3ML) 0.083% nebulizer solution USE 1 VIAL VIA NEBULIZER EVERY 6 HOURS AS NEEDED FOR WHEEZING OR SHORTNESS OF BREATH  150 mL  1  .  amLODipine (NORVASC) 5 MG tablet TAKE 1 TABLET BY MOUTH DAILY  30 tablet  1  . carvedilol (COREG) 25 MG tablet TAKE 1 TABLET BY MOUTH TWICE DAILY  60 tablet  0  . colchicine 0.6 MG tablet Take 1 tablet (0.6 mg total) by mouth 2 (two) times daily.  60 tablet  1  . COUMADIN 5 MG tablet USE AS DIRECTED BY ANTICOAGULANT CLINIC  40 tablet  3  . digoxin (DIGOX) 0.125 MG tablet TAKE 1 TABLET BY MOUTH EVERY DAY  30 tablet  3  . Docusate Calcium (STOOL SOFTENER PO) Take 1 tablet by mouth daily.      . finasteride (PROSCAR) 5 MG tablet TAKE 1 TABLET BY MOUTH DAILY  30 tablet  1  . furosemide (LASIX) 20 MG tablet Take 1 1/2 to 2 tablets daily for fluid      . LORazepam (ATIVAN) 1 MG tablet TAKE 1/2 TABLET BY MOUTH EVERY DAY AS NEEDED.  30 tablet  0  . losartan (COZAAR) 50 MG tablet TAKE 1 TABLET BY MOUTH DAILY  30 tablet  0  . nystatin ointment (MYCOSTATIN) Apply  topically 2 (two) times daily. Apply twice daily to buttocks/rash until healed  30 g  1  . omeprazole (PRILOSEC) 20 MG capsule Take 20 mg by mouth daily. For reflux       . Silodosin (RAPAFLO PO) Take 8 mg by mouth daily. To help with urinary flow      . simvastatin (ZOCOR) 20 MG tablet TAKE 1 TABLET BY MOUTH AT BEDTIME  90 tablet  1  . traMADol (ULTRAM) 50 MG tablet Take 1 tablet (50 mg total) by mouth every 8 (eight) hours as needed.  90 tablet  0   No current facility-administered medications on file prior to visit.   No Known Allergies   ROS: See HPI for pertinent positives and negatives.  Physical Examination  Filed Vitals:   11/22/13 1117  BP: 111/61  Pulse: 60  Resp: 16  Height: 5\' 8"  (1.727 m)  Weight: 159 lb (72.122 kg)  SpO2: 96%   Body mass index is 24.18 kg/(m^2).  General: A&O x 3, WD, in wheelchair  Pulmonary: Sym exp, good air movt, CTAB, no rales, rhonchi, & wheezing, occasional moist cough   Cardiac: RRR, Nl S1, S2, no Murmur detected, aortic valve click heard  Vascular: Vessel Right Left  Radial 1+Palpable 1+Palpable  Carotid without bruit without bruit  Aorta Not palpable N/A  Popliteal Not palpable Not palpable  PT notPalpable notPalpable  DP 2+Palpable 1+Palpable   Gastrointestinal: soft, NTND, -G/R, - HSM, - masses, - CVAT B.  Musculoskeletal: M/S 4/5 in all extremities, Extremities without ischemic changes. Venous stasis skin changes in lower legs with hemosiderin deposits.  Neurologic: Pain and light touch intact in extremities, Motor exam as listed above  Non-Invasive Vascular Imaging  [facility]AR Duplex (Date: 11/22/2013) ABDOMINAL AORTA DUPLEX EVALUATION - POST ENDOVASCULAR REPAIR    INDICATION: Evaluate diameter of abdominal aortic aneurysm    PREVIOUS INTERVENTION(S): 08-12-1999 - Endovascular repair of abdominal aortic aneurysm    DUPLEX EXAM:      DIAMETER AP (cm) DIAMETER TRANSVERSE (cm) VELOCITIES (cm/sec)  Aorta 3.71 3.75 38  Right  Common Iliac 1.63 1.56 47  Left Common Iliac 1.54 1.53 41    Comparison Study       Date DIAMETER AP (cm) DIAMETER TRANSVERSE (cm)  11-16-2012/09-05-2009 4.75/3.52 4.32/3.33     ADDITIONAL FINDINGS:     IMPRESSION: 1.  Patent aortic  stent. 2. Abdominal aortic aneurysm measuring 3.71 cm AP x 3.75 cm transverse    Compared to the previous exam:  No change in the size of the abdominal aortic aneurysm when compared to duplex exams dating back to 2010 and 2011.  Please compare to the studies from 2010and 2011.      Medical Decision Making  [name] is a 80 y.o. male who presents s/p EVAR.  Pt is asymptomatic with no change in sac size.  I discussed with the patient the importance of surveillance of the endograft.  The next endograft duplex will be scheduled for 12 months.  Will also check ABI's as he has had slow healing wounds in LLE, but this is more likely secondary to venous stasis.  Elevate legs.  The patient will follow up with [place] in 12 months with these studies.  I emphasized the importance of maximal medical management including strict control of blood pressure, blood glucose, and lipid levels, antiplatelet agents, obtaining regular exercise, and cessation of smoking.   The patient was given information about AAA including signs, symptoms, treatment, and how to minimize the risk of enlargement and rupture of aneurysms.    Thank you for allowing [place] to participate in this patient's care.  Clemon Chambers, RN, MSN, FNP-C [facility] Office: [phone]  Clinic Physician: Scot Dock  11/22/2013, 11:24 AM

## 2013-11-30 ENCOUNTER — Other Ambulatory Visit: Payer: Self-pay | Admitting: Family

## 2013-12-01 ENCOUNTER — Ambulatory Visit (HOSPITAL_BASED_OUTPATIENT_CLINIC_OR_DEPARTMENT_OTHER)
Admission: RE | Admit: 2013-12-01 | Discharge: 2013-12-01 | Disposition: A | Discharge status: Home / Self Care | Payer: Medicare Other | Source: Ambulatory Visit | Attending: Physician Assistant | Admitting: Physician Assistant

## 2013-12-01 ENCOUNTER — Encounter: Payer: Self-pay | Admitting: Physician Assistant

## 2013-12-01 ENCOUNTER — Ambulatory Visit (INDEPENDENT_AMBULATORY_CARE_PROVIDER_SITE_OTHER): Payer: Medicare Other | Admitting: Physician Assistant

## 2013-12-01 ENCOUNTER — Telehealth: Payer: Self-pay | Admitting: Physician Assistant

## 2013-12-01 VITALS — BP 141/70 | HR 70 | Temp 97.7°F | Resp 16 | Ht 68.0 in | Wt 161.8 lb

## 2013-12-01 DIAGNOSIS — K7689 Other specified diseases of liver: Secondary | ICD-10-CM | POA: Insufficient documentation

## 2013-12-01 DIAGNOSIS — K8020 Calculus of gallbladder without cholecystitis without obstruction: Secondary | ICD-10-CM | POA: Insufficient documentation

## 2013-12-01 DIAGNOSIS — R079 Chest pain, unspecified: Secondary | ICD-10-CM | POA: Insufficient documentation

## 2013-12-01 DIAGNOSIS — M546 Pain in thoracic spine: Secondary | ICD-10-CM

## 2013-12-01 DIAGNOSIS — R1011 Right upper quadrant pain: Secondary | ICD-10-CM

## 2013-12-01 LAB — CBC WITH DIFFERENTIAL/PLATELET
Basophils Absolute: 0 10*3/uL (ref 0.0–0.1)
Basophils Relative: 0 % (ref 0–1)
Eosinophils Absolute: 0.2 10*3/uL (ref 0.0–0.7)
Eosinophils Relative: 2 % (ref 0–5)
HCT: 36.2 % — ABNORMAL LOW (ref 39.0–52.0)
Hemoglobin: 12.4 g/dL — ABNORMAL LOW (ref 13.0–17.0)
LYMPHS ABS: 1.4 10*3/uL (ref 0.7–4.0)
LYMPHS PCT: 18 % (ref 12–46)
MCH: 31.6 pg (ref 26.0–34.0)
MCHC: 34.3 g/dL (ref 30.0–36.0)
MCV: 92.3 fL (ref 78.0–100.0)
Monocytes Absolute: 0.9 10*3/uL (ref 0.1–1.0)
Monocytes Relative: 12 % (ref 3–12)
NEUTROS ABS: 5.2 10*3/uL (ref 1.7–7.7)
NEUTROS PCT: 68 % (ref 43–77)
PLATELETS: 271 10*3/uL (ref 150–400)
RBC: 3.92 MIL/uL — AB (ref 4.22–5.81)
RDW: 15 % (ref 11.5–15.5)
WBC: 7.7 10*3/uL (ref 4.0–10.5)

## 2013-12-01 LAB — POCT URINALYSIS DIPSTICK
Bilirubin, UA: NEGATIVE
Blood, UA: NEGATIVE
GLUCOSE UA: NEGATIVE
Ketones, UA: NEGATIVE
LEUKOCYTES UA: NEGATIVE
NITRITE UA: NEGATIVE
PROTEIN UA: NEGATIVE
Spec Grav, UA: 1.01
UROBILINOGEN UA: 0.2
pH, UA: 7.5

## 2013-12-01 LAB — COMPREHENSIVE METABOLIC PANEL
ALT: 32 U/L (ref 0–53)
AST: 35 U/L (ref 0–37)
Albumin: 3.8 g/dL (ref 3.5–5.2)
Alkaline Phosphatase: 144 U/L — ABNORMAL HIGH (ref 39–117)
BUN: 14 mg/dL (ref 6–23)
CALCIUM: 9 mg/dL (ref 8.4–10.5)
CHLORIDE: 98 meq/L (ref 96–112)
CO2: 30 meq/L (ref 19–32)
Creat: 0.85 mg/dL (ref 0.50–1.35)
Glucose, Bld: 113 mg/dL — ABNORMAL HIGH (ref 70–99)
Potassium: 4.5 mEq/L (ref 3.5–5.3)
SODIUM: 137 meq/L (ref 135–145)
TOTAL PROTEIN: 6.6 g/dL (ref 6.0–8.3)
Total Bilirubin: 0.8 mg/dL (ref 0.2–1.2)

## 2013-12-01 LAB — LIPASE: Lipase: 45 U/L (ref 0–75)

## 2013-12-01 NOTE — Progress Notes (Signed)
Pre visit review using our clinic review tool, if applicable. No additional management support is needed unless otherwise documented below in the visit note/SLS  

## 2013-12-01 NOTE — Telephone Encounter (Signed)
Spoke with patient's daughter concerning x-ray and [place] results.  Will refer him to a general surgeon for symptomatic cholelithiasis, but I am not sure what they will decide giving his age.  Clear liquid diet for 24 hours.  Then reintroduce simple carbs.  Continue care as discussed at visit.  Will call with lab results.

## 2013-12-01 NOTE — Patient Instructions (Signed)
Increase fluid intake.  Rest.  Continue Tramadol. Avoid heavy foods.  Apply a heating pad to your back.  Please obtain labs.  Then go downstairs for an x-ray. Stop by the front desk to have your Ultrasound scheduled.   If symptoms acutely worsen over the weekend, please proceed to the ER.

## 2013-12-04 ENCOUNTER — Ambulatory Visit: Payer: Medicare Other | Admitting: Family

## 2013-12-04 DIAGNOSIS — R1011 Right upper quadrant pain: Secondary | ICD-10-CM | POA: Insufficient documentation

## 2013-12-04 NOTE — Progress Notes (Signed)
Patient presents to clinic today c/o RUQ pain radiating to his back x 4 days.  Endorses nausea but denies emesis. Denies change to bowel or bladder habits.  Some pain with inspiration.  Denies trauma or injury.  Denies cough, SOB or wheezing.  Denies recent URI.  Patient does have diagnosis of cholelithiasis found on imaging incidentally.  Past Medical History  Diagnosis Date  . CAD (coronary artery disease)   . COPD (chronic obstructive pulmonary disease)   . C. difficile colitis     recurrent diarrhea  . Hyperlipidemia   . HTN (hypertension)   . Osteoarthritis   . BPH (benign prostatic hypertrophy)   . Low back pain   . Lumbar disc disease   . CHF (congestive heart failure)   . Permanent atrial fibrillation   . Colonic polyp   . Diverticulosis of colon   . Anxiety   . GERD (gastroesophageal reflux disease)   . PVD (peripheral vascular disease)   . TIA (transient ischemic attack)   . Diarrhea     recurrent diarrhea  . Tachy-brady syndrome   . Abdominal aneurysm   . Atrial fibrillation     Current Outpatient Prescriptions on File Prior to Visit  Medication Sig Dispense Refill  . albuterol (PROVENTIL) (2.5 MG/3ML) 0.083% nebulizer solution USE 1 VIAL VIA NEBULIZER EVERY 6 HOURS AS NEEDED FOR WHEEZING OR SHORTNESS OF BREATH  150 mL  1  . amLODipine (NORVASC) 5 MG tablet TAKE 1 TABLET BY MOUTH DAILY  30 tablet  1  . carvedilol (COREG) 25 MG tablet TAKE 1 TABLET BY MOUTH TWICE DAILY  60 tablet  0  . colchicine 0.6 MG tablet Take 1 tablet (0.6 mg total) by mouth 2 (two) times daily.  60 tablet  1  . COUMADIN 5 MG tablet USE AS DIRECTED BY ANTICOAGULANT CLINIC  40 tablet  3  . Cyanocobalamin (VITAMIN B 12 PO) Take 500 mg by mouth daily.      . digoxin (DIGOX) 0.125 MG tablet TAKE 1 TABLET BY MOUTH EVERY DAY  30 tablet  3  . Docusate Calcium (STOOL SOFTENER PO) Take 1 tablet by mouth daily.      . finasteride (PROSCAR) 5 MG tablet TAKE 1 TABLET BY MOUTH DAILY  30 tablet  1  .  furosemide (LASIX) 20 MG tablet Take 1 1/2 to 2 tablets daily for fluid      . LORazepam (ATIVAN) 1 MG tablet TAKE 1/2 TABLET BY MOUTH EVERY DAY AS NEEDED.  30 tablet  0  . losartan (COZAAR) 50 MG tablet TAKE 1 TABLET BY MOUTH DAILY  30 tablet  0  . Multiple Vitamins-Minerals (EYE-VITES) TABS Take by mouth daily.      Marland Kitchen nystatin ointment (MYCOSTATIN) Apply topically 2 (two) times daily. Apply twice daily to buttocks/rash until healed  30 g  1  . omeprazole (PRILOSEC) 20 MG capsule Take 20 mg by mouth daily. For reflux       . Silodosin (RAPAFLO PO) Take 8 mg by mouth daily. To help with urinary flow      . simvastatin (ZOCOR) 20 MG tablet TAKE 1 TABLET BY MOUTH AT BEDTIME  90 tablet  1  . traMADol (ULTRAM) 50 MG tablet Take 1 tablet (50 mg total) by mouth every 8 (eight) hours as needed.  90 tablet  0   No current facility-administered medications on file prior to visit.    No Known Allergies  Family History  Problem Relation Age of Onset  .  Hypertension Mother   . Cancer Mother   . Heart disease Mother     Before age 42  . Hyperlipidemia Mother   . Stroke Father     died of stroke  . Stroke Sister     died of stroke  . Deep vein thrombosis Sister     Amputation of Leg  . Hypertension Sister   . Peripheral vascular disease Sister   . Cancer Other     wife diagnosed with advanced bladder cancer-deceased  . Cancer Brother     Prostate  . Hypertension Brother   . Diabetes Daughter   . Hypertension Daughter   . Varicose Veins Daughter     History   Social History  . Marital Status: Widowed    Spouse Name: N/A    Number of Children: N/A  . Years of Education: N/A   Social History Main Topics  . Smoking status: Former Smoker    Quit date: 11/23/1991  . Smokeless tobacco: Current User    Types: Chew  . Alcohol Use: No  . Drug Use: No  . Sexual Activity: None   Other Topics Concern  . None   Social History Narrative   Former Smoker     Alcohol use-no   Widowed                Review of Systems - See HPI.  All other ROS are negative.  BP 141/70  Pulse 70  Temp(Src) 97.7 F (36.5 C) (Oral)  Resp 16  Ht $R'5\' 8"'VH$  (1.727 m)  Wt 161 lb 12 oz (73.369 kg)  BMI 24.60 kg/m2  SpO2 97%  Physical Exam  Vitals reviewed. Constitutional: He is well-developed, well-nourished, and in no distress.  HENT:  Head: Normocephalic and atraumatic.  Right Ear: External ear normal.  Left Ear: External ear normal.  Nose: Nose normal.  Mouth/Throat: Oropharynx is clear and moist. No oropharyngeal exudate.  TM within normal limits bilaterally.  Eyes: Conjunctivae and EOM are normal. Pupils are equal, round, and reactive to light.  Neck: Neck supple.  Cardiovascular: Normal rate, regular rhythm, normal heart sounds and intact distal pulses.   Pulmonary/Chest: Effort normal and breath sounds normal. No respiratory distress. He has no wheezes. He has no rales. He exhibits no tenderness.  Negative CVA tenderness.  Abdominal: Soft. Bowel sounds are normal. He exhibits no distension and no mass. There is no rebound and no guarding.  + RUQ pain.  Negative Murphy sign.  Lymphadenopathy:    He has no cervical adenopathy.  Skin: Skin is warm and dry. No rash noted.  Psychiatric: Affect normal.   Recent Results (from the past 2160 hour(s))  POCT INR     Status: None   Collection Time    09/05/13  4:07 PM      Result Value Ref Range   INR 2.7    POCT INR     Status: None   Collection Time    10/03/13 10:59 AM      Result Value Ref Range   INR 2.0    BASIC METABOLIC PANEL     Status: Abnormal   Collection Time    10/10/13  3:21 AM      Result Value Ref Range   Sodium 133 (*) 135 - 145 mEq/L   Potassium 5.0  3.5 - 5.3 mEq/L   Chloride 93 (*) 96 - 112 mEq/L   CO2 30  19 - 32 mEq/L   Glucose, Bld 130 (*) 70 -  99 mg/dL   BUN 19  6 - 23 mg/dL   Creat 0.85  0.50 - 1.35 mg/dL   Calcium 8.7  8.4 - 10.5 mg/dL  HEPATIC FUNCTION PANEL     Status: Abnormal   Collection Time     10/10/13  3:21 AM      Result Value Ref Range   Total Bilirubin 1.1  0.2 - 1.2 mg/dL   Bilirubin, Direct 0.4 (*) 0.0 - 0.3 mg/dL   Indirect Bilirubin 0.7  0.2 - 1.2 mg/dL   Alkaline Phosphatase 107  39 - 117 U/L   AST 29  0 - 37 U/L   ALT 29  0 - 53 U/L   Total Protein 5.9 (*) 6.0 - 8.3 g/dL   Albumin 3.4 (*) 3.5 - 5.2 g/dL  POCT INR     Status: None   Collection Time    10/16/13  3:44 PM      Result Value Ref Range   INR 2.1    POCT INR     Status: None   Collection Time    10/31/13  3:40 PM      Result Value Ref Range   INR 3.6    BASIC METABOLIC PANEL WITH GFR     Status: Abnormal   Collection Time    11/15/13 11:55 AM      Result Value Ref Range   Sodium 136  135 - 145 mEq/L   Potassium 4.1  3.5 - 5.3 mEq/L   Chloride 97  96 - 112 mEq/L   CO2 28  19 - 32 mEq/L   Glucose, Bld 137 (*) 70 - 99 mg/dL   BUN 13  6 - 23 mg/dL   Creat 0.85  0.50 - 1.35 mg/dL   Calcium 8.9  8.4 - 10.5 mg/dL   GFR, Est African American >89     GFR, Est Non African American 80     Comment:       The estimated GFR is a calculation valid for adults (>=68 years old)     that uses the CKD-EPI algorithm to adjust for age and sex. It is       not to be used for children, pregnant women, hospitalized patients,        patients on dialysis, or with rapidly changing kidney function.     According to the NKDEP, eGFR >89 is normal, 60-89 shows mild     impairment, 30-59 shows moderate impairment, 15-29 shows severe     impairment and <15 is ESRD.        HEPATIC FUNCTION PANEL     Status: Abnormal   Collection Time    11/15/13 11:55 AM      Result Value Ref Range   Total Bilirubin 1.0  0.2 - 1.2 mg/dL   Bilirubin, Direct 0.2  0.0 - 0.3 mg/dL   Indirect Bilirubin 0.8  0.2 - 1.2 mg/dL   Alkaline Phosphatase 161 (*) 39 - 117 U/L   AST 38 (*) 0 - 37 U/L   ALT 33  0 - 53 U/L   Total Protein 6.5  6.0 - 8.3 g/dL   Albumin 3.6  3.5 - 5.2 g/dL  HEMOGLOBIN A1C     Status: Abnormal   Collection Time     11/15/13 11:55 AM      Result Value Ref Range   Hemoglobin A1C 6.6 (*) <5.7 %   Comment:  According to the ADA Clinical Practice Recommendations for 2011, when     HbA1c is used as a screening test:             >=6.5%   Diagnostic of Diabetes Mellitus                (if abnormal result is confirmed)           5.7-6.4%   Increased risk of developing Diabetes Mellitus           References:Diagnosis and Classification of Diabetes Mellitus,Diabetes     WGNF,6213,08(MVHQI 1):S62-S69 and Standards of Medical Care in             Diabetes - 2011,Diabetes ONGE,9528,41 (Suppl 1):S11-S61.         Mean Plasma Glucose 143 (*) <117 mg/dL  URIC ACID     Status: None   Collection Time    11/15/13 11:55 AM      Result Value Ref Range   Uric Acid, Serum 6.0  4.0 - 7.8 mg/dL  TSH     Status: None   Collection Time    11/15/13 11:55 AM      Result Value Ref Range   TSH 1.408  0.350 - 4.500 uIU/mL  POCT INR     Status: None   Collection Time    11/22/13 12:39 PM      Result Value Ref Range   INR 2.9    POCT URINALYSIS DIPSTICK     Status: None   Collection Time    12/01/13  2:43 PM      Result Value Ref Range   Color, UA gold     Clarity, UA clear     Glucose, UA neg     Bilirubin, UA neg     Ketones, UA neg     Spec Grav, UA 1.010     Blood, UA neg     pH, UA 7.5     Protein, UA neg     Urobilinogen, UA 0.2     Nitrite, UA neg     Leukocytes, UA Negative    CBC WITH DIFFERENTIAL     Status: Abnormal   Collection Time    12/01/13  3:23 PM      Result Value Ref Range   WBC 7.7  4.0 - 10.5 K/uL   RBC 3.92 (*) 4.22 - 5.81 MIL/uL   Hemoglobin 12.4 (*) 13.0 - 17.0 g/dL   HCT 36.2 (*) 39.0 - 52.0 %   MCV 92.3  78.0 - 100.0 fL   MCH 31.6  26.0 - 34.0 pg   MCHC 34.3  30.0 - 36.0 g/dL   RDW 15.0  11.5 - 15.5 %   Platelets 271  150 - 400 K/uL   Neutrophils Relative % 68  43 - 77 %   Neutro Abs 5.2  1.7 - 7.7 K/uL    Lymphocytes Relative 18  12 - 46 %   Lymphs Abs 1.4  0.7 - 4.0 K/uL   Monocytes Relative 12  3 - 12 %   Monocytes Absolute 0.9  0.1 - 1.0 K/uL   Eosinophils Relative 2  0 - 5 %   Eosinophils Absolute 0.2  0.0 - 0.7 K/uL   Basophils Relative 0  0 - 1 %   Basophils Absolute 0.0  0.0 - 0.1 K/uL   Smear Review Criteria for review not met    COMPREHENSIVE METABOLIC PANEL     Status: Abnormal  Collection Time    12/01/13  3:23 PM      Result Value Ref Range   Sodium 137  135 - 145 mEq/L   Potassium 4.5  3.5 - 5.3 mEq/L   Chloride 98  96 - 112 mEq/L   CO2 30  19 - 32 mEq/L   Glucose, Bld 113 (*) 70 - 99 mg/dL   BUN 14  6 - 23 mg/dL   Creat 0.85  0.50 - 1.35 mg/dL   Total Bilirubin 0.8  0.2 - 1.2 mg/dL   Alkaline Phosphatase 144 (*) 39 - 117 U/L   AST 35  0 - 37 U/L   ALT 32  0 - 53 U/L   Total Protein 6.6  6.0 - 8.3 g/dL   Albumin 3.8  3.5 - 5.2 g/dL   Calcium 9.0  8.4 - 10.5 mg/dL  LIPASE     Status: None   Collection Time    12/01/13  3:23 PM      Result Value Ref Range   Lipase 45  0 - 75 U/L   Assessment/Plan: RUQ pain Urine dip unremarkable.  Concern for symptomatic cholelithiasis vs cholecystitis vs pancreatitis vs pneumonia.  Will obtain labs.  Will also obtain STAT CXR and US abdomen.  Tramadol for pain.  Clear liquid diet. Heating pad to area.  Discussed with patient's daughter that there is a low threshold for taking patient to the ER.

## 2013-12-04 NOTE — Assessment & Plan Note (Signed)
Urine dip unremarkable.  Concern for symptomatic cholelithiasis vs cholecystitis vs pancreatitis vs pneumonia.  Will obtain labs.  Will also obtain STAT CXR and US abdomen.  Tramadol for pain.  Clear liquid diet. Heating pad to area.  Discussed with patient's daughter that there is a low threshold for taking patient to the ER.

## 2013-12-06 ENCOUNTER — Ambulatory Visit (INDEPENDENT_AMBULATORY_CARE_PROVIDER_SITE_OTHER): Payer: Medicare Other | Admitting: Family Medicine

## 2013-12-06 ENCOUNTER — Encounter: Payer: Self-pay | Admitting: Family Medicine

## 2013-12-06 VITALS — BP 140/66 | HR 60 | Ht 68.0 in | Wt 161.0 lb

## 2013-12-06 DIAGNOSIS — S42202D Unspecified fracture of upper end of left humerus, subsequent encounter for fracture with routine healing: Secondary | ICD-10-CM

## 2013-12-06 DIAGNOSIS — S42309D Unspecified fracture of shaft of humerus, unspecified arm, subsequent encounter for fracture with routine healing: Secondary | ICD-10-CM

## 2013-12-06 NOTE — Patient Instructions (Signed)
Do the home exercises every day to regain as much motion as you can. Arm circles, pendulums, table slides, wall walking - 3 sets of 10 once or twice a day. Consider physical therapy. Follow up with me in 6 weeks if you're having difficulty otherwise as needed.

## 2013-12-07 ENCOUNTER — Encounter: Payer: Self-pay | Admitting: Family Medicine

## 2013-12-07 NOTE — Assessment & Plan Note (Signed)
Left comminuted proximal humerus fracture - Declined repeat radiographs today.  Clinically healed though needs to work on motion at this point.  Declined outpatient PT - wants to continue with HEP.  Call if he would like to do PT.  F/u in 6 weeks if he's having any problems.

## 2013-12-07 NOTE — Progress Notes (Signed)
Patient ID: [name], male   DOB: Apr 26, 1928, 80 y.o.   MRN: [id number]  PCP: Nance Pear., NP  Subjective:   HPI: Patient is a 80 y.o. male here for left arm fracture.  5/5: Patient reports on 4/28 when walking his feet got tangled and he fell down directly onto his left shoulder. No loss of consciousness. No dizziness, chest pain prior to the fall. Severe pain and a lot of swelling, bruising of left shoulder following this. Went to ED where x-rays showed a comminuted proximal humerus fracture. He is a very poor surgical candidate with his comorbidities. Tried oxycodone but it made him nauseous - able to tolerate tramadol, tylenol.  6/3: Patient reports overall he is better. Pain down to 4/10. Has been using sling at all times. Taking tramadol, tylenol as needed.  7/1: Patient reports his pain has resolved at this point. Some stiffness and hasn't regained full motion. Unable to get home health PT because he is not bed bound - did not want to go to outpatient PT. Doing home exercises.  Past Medical History  Diagnosis Date  . CAD (coronary artery disease)   . COPD (chronic obstructive pulmonary disease)   . C. difficile colitis     recurrent diarrhea  . Hyperlipidemia   . HTN (hypertension)   . Osteoarthritis   . BPH (benign prostatic hypertrophy)   . Low back pain   . Lumbar disc disease   . CHF (congestive heart failure)   . Permanent atrial fibrillation   . Colonic polyp   . Diverticulosis of colon   . Anxiety   . GERD (gastroesophageal reflux disease)   . PVD (peripheral vascular disease)   . TIA (transient ischemic attack)   . Diarrhea     recurrent diarrhea  . Tachy-brady syndrome   . Abdominal aneurysm   . Atrial fibrillation     Current Outpatient Prescriptions on File Prior to Visit  Medication Sig Dispense Refill  . albuterol (PROVENTIL) (2.5 MG/3ML) 0.083% nebulizer solution USE 1 VIAL VIA NEBULIZER EVERY 6 HOURS AS NEEDED FOR WHEEZING  OR SHORTNESS OF BREATH  150 mL  1  . amLODipine (NORVASC) 5 MG tablet TAKE 1 TABLET BY MOUTH DAILY  30 tablet  1  . carvedilol (COREG) 25 MG tablet TAKE 1 TABLET BY MOUTH TWICE DAILY  60 tablet  0  . colchicine 0.6 MG tablet Take 1 tablet (0.6 mg total) by mouth 2 (two) times daily.  60 tablet  1  . COUMADIN 5 MG tablet USE AS DIRECTED BY ANTICOAGULANT CLINIC  40 tablet  3  . Cyanocobalamin (VITAMIN B 12 PO) Take 500 mg by mouth daily.      . digoxin (DIGOX) 0.125 MG tablet TAKE 1 TABLET BY MOUTH EVERY DAY  30 tablet  3  . Docusate Calcium (STOOL SOFTENER PO) Take 1 tablet by mouth daily.      . finasteride (PROSCAR) 5 MG tablet TAKE 1 TABLET BY MOUTH DAILY  30 tablet  1  . furosemide (LASIX) 20 MG tablet Take 1 1/2 to 2 tablets daily for fluid      . LORazepam (ATIVAN) 1 MG tablet TAKE 1/2 TABLET BY MOUTH EVERY DAY AS NEEDED.  30 tablet  0  . losartan (COZAAR) 50 MG tablet TAKE 1 TABLET BY MOUTH DAILY  30 tablet  0  . Multiple Vitamins-Minerals (EYE-VITES) TABS Take by mouth daily.      Marland Kitchen nystatin ointment (MYCOSTATIN) Apply topically 2 (two) times daily. Apply  twice daily to buttocks/rash until healed  30 g  1  . omeprazole (PRILOSEC) 20 MG capsule Take 20 mg by mouth daily. For reflux       . Silodosin (RAPAFLO PO) Take 8 mg by mouth daily. To help with urinary flow      . simvastatin (ZOCOR) 20 MG tablet TAKE 1 TABLET BY MOUTH AT BEDTIME  90 tablet  1  . traMADol (ULTRAM) 50 MG tablet Take 1 tablet (50 mg total) by mouth every 8 (eight) hours as needed.  90 tablet  0   No current facility-administered medications on file prior to visit.    Past Surgical History  Procedure Laterality Date  . Coronary artery bypass graft  1993  . Pacemaker placement  1992 and 2005    permanent and new placement 2005  . Abdominal aortic aneurysm repair      with intraluminal graft  . Aortic valve replacement      s/p   . Retinal detachment surgery      repair of right eye 9/211- Dr Zadie Rhine    No  Known Allergies  History   Social History  . Marital Status: Widowed    Spouse Name: N/A    Number of Children: N/A  . Years of Education: N/A   Occupational History  . Not on file.   Social History Main Topics  . Smoking status: Former Smoker    Quit date: 11/23/1991  . Smokeless tobacco: Current User    Types: Chew  . Alcohol Use: No  . Drug Use: No  . Sexual Activity: Not on file   Other Topics Concern  . Not on file   Social History Narrative   Former Smoker     Alcohol use-no   Widowed                Family History  Problem Relation Age of Onset  . Hypertension Mother   . Cancer Mother   . Heart disease Mother     Before age 37  . Hyperlipidemia Mother   . Stroke Father     died of stroke  . Stroke Sister     died of stroke  . Deep vein thrombosis Sister     Amputation of Leg  . Hypertension Sister   . Peripheral vascular disease Sister   . Cancer Other     wife diagnosed with advanced bladder cancer-deceased  . Cancer Brother     Prostate  . Hypertension Brother   . Diabetes Daughter   . Hypertension Daughter   . Varicose Veins Daughter     BP 140/66  Pulse 60  Ht 5\' 8"  (1.727 m)  Wt 161 lb (73.029 kg)  BMI 24.49 kg/m2  Review of Systems: See HPI above.    Objective:  Physical Exam:  Gen: NAD  Left shoulder: No longer with bruising, swelling.  Callus palpable proximal humerus but not tender. No longer with tenderness proximal upper arm, shoulder. Able to flex, abduct to 90 degrees, ER to 60 degrees. - not painful but stiff. Able to oppose thumb, abduct and flex/extend all digits. NVI distally.    Assessment & Plan:  1. Left comminuted proximal humerus fracture - Declined repeat radiographs today.  Clinically healed though needs to work on motion at this point.  Declined outpatient PT - wants to continue with HEP.  Call if he would like to do PT.  F/u in 6 weeks if he's having any problems.

## 2013-12-13 ENCOUNTER — Other Ambulatory Visit: Payer: Self-pay

## 2013-12-13 MED ORDER — CARVEDILOL 25 MG PO TABS: ORAL_TABLET | ORAL | Status: DC

## 2013-12-18 ENCOUNTER — Ambulatory Visit (INDEPENDENT_AMBULATORY_CARE_PROVIDER_SITE_OTHER): Payer: Medicare Other | Admitting: Surgery

## 2013-12-18 ENCOUNTER — Other Ambulatory Visit: Payer: Self-pay | Admitting: Internal Medicine

## 2013-12-20 ENCOUNTER — Ambulatory Visit (INDEPENDENT_AMBULATORY_CARE_PROVIDER_SITE_OTHER): Payer: Medicare Other | Admitting: *Deleted

## 2013-12-20 DIAGNOSIS — Z8679 Personal history of other diseases of the circulatory system: Secondary | ICD-10-CM

## 2013-12-20 DIAGNOSIS — I359 Nonrheumatic aortic valve disorder, unspecified: Secondary | ICD-10-CM

## 2013-12-20 DIAGNOSIS — Z5181 Encounter for therapeutic drug level monitoring: Secondary | ICD-10-CM

## 2013-12-20 DIAGNOSIS — I4891 Unspecified atrial fibrillation: Secondary | ICD-10-CM

## 2013-12-20 DIAGNOSIS — Z954 Presence of other heart-valve replacement: Secondary | ICD-10-CM

## 2013-12-20 DIAGNOSIS — I482 Chronic atrial fibrillation, unspecified: Secondary | ICD-10-CM

## 2013-12-20 LAB — MDC_IDC_ENUM_SESS_TYPE_INCLINIC
Battery Impedance: 2200 Ohm
Battery Voltage: 2.76 V
Brady Statistic RA Percent Paced: 1 %
Brady Statistic RV Percent Paced: 88 %
Implantable Pulse Generator Model: 5386
Lead Channel Impedance Value: 424 Ohm
Lead Channel Pacing Threshold Pulse Width: 0.4 ms
Lead Channel Sensing Intrinsic Amplitude: 0.8 mV
Lead Channel Setting Pacing Amplitude: 2.5 V
Lead Channel Setting Pacing Pulse Width: 0.4 ms
Lead Channel Setting Sensing Sensitivity: 2 mV
MDC IDC MSMT LEADCHNL RA IMPEDANCE VALUE: 344 Ohm
MDC IDC MSMT LEADCHNL RV PACING THRESHOLD AMPLITUDE: 0.5 V
MDC IDC MSMT LEADCHNL RV SENSING INTR AMPL: 9.9 mV
MDC IDC PG SERIAL: 1577438
MDC IDC SESS DTM: 20150715110543

## 2013-12-20 LAB — POCT INR: INR: 3.6

## 2013-12-20 NOTE — Progress Notes (Signed)
Pacemaker check in clinic. Normal device function. Thresholds, sensing, impedances consistent with previous measurements. Device programmed to maximize longevity. Pt in AF + Warfarin. No high ventricular rates noted. Device programmed at appropriate safety margins. Histogram distribution appropriate for patient activity level. Device programmed to optimize intrinsic conduction. Estimated longevity 4.25 to 6.50 years. Patient enrolled in TTM's with [employer]. [employer] every 3 months and ROV in April with [facility].

## 2013-12-26 ENCOUNTER — Other Ambulatory Visit: Payer: Self-pay | Admitting: Internal Medicine

## 2013-12-26 ENCOUNTER — Telehealth: Payer: Self-pay | Admitting: Family

## 2013-12-26 NOTE — Telephone Encounter (Signed)
Last alprazolam Rx given 10/10/13, #30. Rx printed and forwarded to [facility] for signature.

## 2013-12-27 NOTE — Telephone Encounter (Signed)
Rx faxed to pharmacy  

## 2013-12-29 ENCOUNTER — Telehealth: Payer: Self-pay | Admitting: Cardiology

## 2013-12-29 NOTE — Telephone Encounter (Signed)
Received call from [facility] with [facility] she wanted to ask Dr.Crenshaw if ok for patient to hold coumadin 5 days prior to a CT guided biopsy.Message sent to [facility] for advice.

## 2013-12-29 NOTE — Telephone Encounter (Signed)
New Message  Kaitlyn with [facility] care called reports the pt will need a CT guided biopsy... Please call back to discuss coumadin levels.

## 2013-12-29 NOTE — Telephone Encounter (Signed)
Returned call to [facility] with [facility] out of office.[place].

## 2013-12-29 NOTE — Telephone Encounter (Signed)
Patient would need lovenox bridge if off coumadin; he will need FUOV with either Dr Lovena Le who saw him last or me (it appears I have not seen since 2012. [name]

## 2014-01-01 NOTE — Telephone Encounter (Signed)
Left message for [facility] to call

## 2014-01-03 NOTE — Telephone Encounter (Addendum)
Called pulmonary, made an appt for pt to be seen tomorrow by scott weaver for clearance. If cleared the [facility] clinic will be in touch with pulmonary regarding scheduling and lovenox bridging. Note forwarded to [facility] clinic

## 2014-01-03 NOTE — Telephone Encounter (Signed)
[facility] is still waiting to hear from [place].

## 2014-01-04 ENCOUNTER — Ambulatory Visit (INDEPENDENT_AMBULATORY_CARE_PROVIDER_SITE_OTHER): Payer: Medicare Other | Admitting: Physician Assistant

## 2014-01-04 ENCOUNTER — Ambulatory Visit (INDEPENDENT_AMBULATORY_CARE_PROVIDER_SITE_OTHER): Payer: Medicare Other | Admitting: *Deleted

## 2014-01-04 ENCOUNTER — Encounter: Payer: Self-pay | Admitting: Physician Assistant

## 2014-01-04 VITALS — BP 129/60 | HR 60 | Ht 68.0 in | Wt 160.0 lb

## 2014-01-04 DIAGNOSIS — I4891 Unspecified atrial fibrillation: Secondary | ICD-10-CM

## 2014-01-04 DIAGNOSIS — Z950 Presence of cardiac pacemaker: Secondary | ICD-10-CM

## 2014-01-04 DIAGNOSIS — Z954 Presence of other heart-valve replacement: Secondary | ICD-10-CM

## 2014-01-04 DIAGNOSIS — I714 Abdominal aortic aneurysm, without rupture, unspecified: Secondary | ICD-10-CM

## 2014-01-04 DIAGNOSIS — I5032 Chronic diastolic (congestive) heart failure: Secondary | ICD-10-CM

## 2014-01-04 DIAGNOSIS — Z8679 Personal history of other diseases of the circulatory system: Secondary | ICD-10-CM

## 2014-01-04 DIAGNOSIS — I10 Essential (primary) hypertension: Secondary | ICD-10-CM

## 2014-01-04 DIAGNOSIS — I359 Nonrheumatic aortic valve disorder, unspecified: Secondary | ICD-10-CM

## 2014-01-04 DIAGNOSIS — I2510 Atherosclerotic heart disease of native coronary artery without angina pectoris: Secondary | ICD-10-CM

## 2014-01-04 DIAGNOSIS — I482 Chronic atrial fibrillation, unspecified: Secondary | ICD-10-CM

## 2014-01-04 DIAGNOSIS — Z01810 Encounter for preprocedural cardiovascular examination: Secondary | ICD-10-CM

## 2014-01-04 DIAGNOSIS — E785 Hyperlipidemia, unspecified: Secondary | ICD-10-CM

## 2014-01-04 DIAGNOSIS — Z5181 Encounter for therapeutic drug level monitoring: Secondary | ICD-10-CM

## 2014-01-04 LAB — POCT INR: INR: 3

## 2014-01-04 MED ORDER — ENOXAPARIN SODIUM 80 MG/0.8ML ~~LOC~~ SOLN: 80.0000 mg | Freq: Two times a day (BID) | SUBCUTANEOUS | Status: DC

## 2014-01-04 NOTE — Progress Notes (Addendum)
Cardiology Office Note    Date:  01/04/2014   ID:  [name], DOB 01-Oct-1927, MRN [id number]  PCP:  Nance Pear., NP  Cardiologist:  Dr. Kirk Ruths   Electrophysiologist:  Dr. Cristopher Peru    History of Present Illness: [name] is a 80 y.o. male with a hx of CAD s/p CABG and St Jude (mechanical) AVR in 1993, chronic atrial fibrillation, diastolic CHF, AAA s/p stent repair in 2001 (Dr. Amedeo Plenty >>> follows with Dr. Scot Dock), HTN, symptomatic bradycardia s/p PPM, HL, COPD.  Last seen by Dr. Kirk Ruths in 2012.  He is referred back today by Greig Castilla, PA-C at [facility] for surgical clearance.  He needs a CT guided bx of a lung mass to be done by IR at [facility].    He is here with his daughter.  He developed R sided chest pain ~ 12 weeks ago.  He thought it was a gallbladder attack and went to the [facility] ED.  CT demonstrated R lung mass.  I have no records.  Patient tells me this is a 5 cm mass and is apparently close to the heart (? Mediastinal mass).  PET scan was suggestive of malignancy.  CT guided bx is planned 8/10.  He is weak.  He notes chronic dyspnea.  He is NYHA 3 now.  He sleeps on an incline chronically.  Denies PND.  LE edema was increased but he took extra lasix recently with improvement.  He has fairly constant chest pain.  It is not really associated with exertion or similar to prior angina.     Studies:  - Echo (12/10):  EF 55-60%, normal wall motion, mechanical AVR okay, MAC, mild LAE, moderate RAE  - Nuclear (5/10):  Lateral scar with mod peri-infarct ischemia (reviewd by Dr. Kirk Ruths and felt to be mild), EF 57%; Low Risk   Recent Labs/Images: 03/29/2013: HDL Cholesterol by NMR 31*; LDL (calc) 44  11/15/2013: TSH 1.408  12/01/2013: ALT 32; Creatinine 0.85; Hemoglobin 12.4*; Potassium 4.5     Wt Readings from Last 3 Encounters:  01/04/14 160 lb (72.576 kg)  12/06/13 161 lb (73.029 kg)  12/01/13 161 lb 12 oz (73.369 kg)      Past Medical History  Diagnosis Date  . CAD (coronary artery disease)     a.  s/p CABG 1993;  b.  Nuclear (5/10):  Lateral scar with mod peri-infarct ischemia (reviewd by Dr. Kirk Ruths and felt to be mild), EF 57%; Low Risk  . COPD (chronic obstructive pulmonary disease)   . History of Clostridium difficile colitis   . Hyperlipidemia   . HTN (hypertension)   . Osteoarthritis   . BPH (benign prostatic hypertrophy)   . Low back pain   . Lumbar disc disease   . Chronic diastolic heart failure   . Permanent atrial fibrillation     coumadin followed at [facility]  . Colonic polyp   . Diverticulosis of colon   . Anxiety   . GERD (gastroesophageal reflux disease)   . PVD (peripheral vascular disease)   . TIA (transient ischemic attack)   . Tachy-brady syndrome     s/p PPM (Dr Lovena Le)  . AAA (abdominal aortic aneurysm)     s/p stent graft repair 2001 (Dr. Amedeo Plenty)  . Aortic valve disorders     a.  s/p St Jude AVR 1993;  b.  Echo (12/10):  EF 55-60%, normal wall motion, mechanical AVR okay, MAC, mild LAE, moderate RAE  .  Lung mass     bx scheduled at [facility] 01/15/2014    Current Outpatient Prescriptions  Medication Sig Dispense Refill  . albuterol (PROVENTIL) (2.5 MG/3ML) 0.083% nebulizer solution USE 1 VIAL VIA NEBULIZER EVERY 6 HOURS AS NEEDED FOR WHEEZING OR SHORTNESS OF BREATH  150 mL  1  . amLODipine (NORVASC) 5 MG tablet TAKE 1 TABLET BY MOUTH DAILY  30 tablet  0  . carvedilol (COREG) 25 MG tablet TAKE 1 TABLET BY MOUTH TWICE DAILY  60 tablet  0  . colchicine 0.6 MG tablet Take 1 tablet (0.6 mg total) by mouth 2 (two) times daily.  60 tablet  1  . Cyanocobalamin (VITAMIN B 12 PO) Take 500 mg by mouth daily.      . digoxin (DIGOX) 0.125 MG tablet TAKE 1 TABLET BY MOUTH EVERY DAY  30 tablet  3  . Docusate Calcium (STOOL SOFTENER PO) Take 1 tablet by mouth daily.      . finasteride (PROSCAR) 5 MG tablet TAKE 1 TABLET BY MOUTH DAILY  30 tablet  0  . furosemide (LASIX) 20 MG  tablet Take 1 1/2 to 2 tablets daily for fluid      . LORazepam (ATIVAN) 1 MG tablet TAKE 1/2 TABLET BY MOUTH ONCE DAILY AS NEEDED  30 tablet  0  . losartan (COZAAR) 50 MG tablet TAKE 1 TABLET BY MOUTH DAILY  30 tablet  2  . Multiple Vitamins-Minerals (EYE-VITES) TABS Take by mouth daily.      Marland Kitchen nystatin ointment (MYCOSTATIN) Apply topically 2 (two) times daily. Apply twice daily to buttocks/rash until healed  30 g  1  . omeprazole (PRILOSEC) 20 MG capsule Take 20 mg by mouth daily. For reflux       . Silodosin (RAPAFLO PO) Take 8 mg by mouth daily. To help with urinary flow      . simvastatin (ZOCOR) 20 MG tablet TAKE 1 TABLET BY MOUTH AT BEDTIME  90 tablet  1  . traMADol (ULTRAM) 50 MG tablet Take 1 tablet (50 mg total) by mouth every 8 (eight) hours as needed.  90 tablet  0  . warfarin (COUMADIN) 5 MG tablet USE AS DIRECTED BY ANTICOAGULANT CLINIC  30 tablet  2   No current facility-administered medications for this visit.     Allergies:   Review of patient's allergies indicates no known allergies.   Social History:  The patient  reports that he quit smoking about 22 years ago. His smokeless tobacco use includes Chew. He reports that he does not drink alcohol or use illicit drugs.   Family History:  The patient's family history includes Cancer in his brother, mother, and other; Deep vein thrombosis in his sister; Diabetes in his daughter; Heart disease in his mother; Heart failure in an other family member; Hyperlipidemia in his mother; Hypertension in his brother, daughter, mother, and sister; Peripheral vascular disease in his sister; Stroke in his father and sister; Varicose Veins in his daughter.   ROS:  Please see the history of present illness.   He has lost 18 lbs since 4/14.  + nausea.  No bleeding.  + cough - white sputum.   All other systems reviewed and negative.   PHYSICAL EXAM: VS:  BP 129/60  Pulse 60  Ht 5\' 8"  (1.727 m)  Wt 160 lb (72.576 kg)  BMI 24.33 kg/m2 Well  nourished, well developed, in no acute distress HEENT: normal Neck: no JVDat 90 degrees Cardiac:  normal S1, mechanical S2; RRR;  no murmur Chest:  Lower right rib tender to palp Lungs:  clear to auscultation bilaterally, no wheezing, rhonchi or rales Abd: soft, nontender, no hepatomegaly Ext: trace bilateral LE edema Skin: warm and dry Neuro:  CNs 2-12 intact, no focal abnormalities noted  EKG:  V paced, HR 60     ASSESSMENT AND PLAN:  Pre-operative cardiovascular examination:  Patient needs a CT guided bx which is low risk.  However, he has not had an echo or stress test in many years. I believe that all his symptoms of chest pain are related to his mass.  He also has a fractured rib which is contributing.  I reviewed his case with Dr. Fransico Him (DOD).  We recommend obtaining 2 D Echo to reassess his AVR prior to proceeding with his bx.  If he requires surgical resection, will need to consider risk stratification with nuclear stress test prior to proceeding.  We will arrange Lovenox bridging with our coumadin clinic so that he can hold his coumadin for the procedure.      Atherosclerosis of native coronary artery of native heart without angina pectoris:  As noted, his chest pain is related to his lung mass and fractured rib.  Continue beta blocker, statin.  No ASA as he is on coumadin.  Arrange follow up with Dr. Kirk Ruths.  If bx leads to recommendation of surgical resection, will need to consider stress test for risk stratification.   AORTIC VALVE REPLACEMENT, HX OF - Plan: 2D Echocardiogram without contrast.  Continue SBE prophylaxis.  Lovenox bridge will be arranged to come off of coumadin for his biopsy.   Chronic atrial fibrillation:  Continue coumadin.  Lovenox bridge as noted.   Chronic diastolic heart failure:  Volume stable.  He takes extra lasix for weight gain and edema.   HYPERTENSION:  Controlled.   AAA:  F/u with [facility].   PACEMAKER, PERMANENT:  F/u with EP as  directed.   HYPERLIPIDEMIA:  Continue statin.    Disposition:  F/u with Dr. Kirk Ruths in 6 weeks.    Signed, Versie Starks, MHS 01/04/2014 9:41 AM    [facility] [address], [place], [place]  [address] Phone: [phone]; Fax: [phone]

## 2014-01-04 NOTE — Telephone Encounter (Signed)
Pt cleared by Richardson Dopp and Lovenox bridge instructions given.  Will send to Julaine Hua, [place] for Nicki Reaper to make sure pulmonary has been notified.

## 2014-01-04 NOTE — Patient Instructions (Signed)
01/09/14- Take last dose of Coumadin  01/10/14- Do Nothing   01/11/14- Start Lovenox 80mg s injections this day into abdominal fatty tissue, 2 inches away from navel  at 9am and 9pm.  01/12/14-Continue Lovenox 80mg s injections at 9am and 9pm into abdominal fatty tissue, 2 inches away from navel.  01/13/14- Continue Lovenox 80mg s injections at 9am and 9pm into abdominal fatty tissue, 2 inches away from navel.  01/14/14- ONLY do one Lovenox 80mg s injection this day at 9am. Do no injections this night  01/15/14- Day Procedure  01/16/14- Restart Coumadin and Lovenox injection this day if okay with doctor after the procedure. Resume your normal dose of Coumadin and     resume Lovenox 80mg s injections at 9am and 9pm. You will continue both medications until you come into Coumadin Clinic for an appt.                On 01/22/14.

## 2014-01-04 NOTE — Patient Instructions (Signed)
Your physician recommends that you continue on your current medications as directed. Please refer to the Current Medication list given to you today.  Your physician has requested that you have an echocardiogram PT IS SCHEDULED FOR A CT GUIDED BIOPSY 8/10, ECHO NEEDS TO BE  DONE BEFORE THEN. Echocardiography is a painless test that uses sound waves to create images of your heart. It provides your doctor with information about the size and shape of your heart and how well your heart's chambers and valves are working. This procedure takes approximately one hour. There are no restrictions for this procedure.   YOU WILL NEED LOVENOX BRIDGING BEFORE YOUR PROCEDURE  Your physician recommends that you schedule a follow-up appointment in: [place] DR. CRENSHAW

## 2014-01-09 ENCOUNTER — Other Ambulatory Visit: Payer: Self-pay | Admitting: Cardiovascular Disease

## 2014-01-12 ENCOUNTER — Ambulatory Visit (HOSPITAL_COMMUNITY): Payer: Medicare Other | Attending: Internal Medicine | Admitting: Radiology

## 2014-01-12 DIAGNOSIS — I359 Nonrheumatic aortic valve disorder, unspecified: Secondary | ICD-10-CM | POA: Insufficient documentation

## 2014-01-12 DIAGNOSIS — J449 Chronic obstructive pulmonary disease, unspecified: Secondary | ICD-10-CM | POA: Insufficient documentation

## 2014-01-12 DIAGNOSIS — I059 Rheumatic mitral valve disease, unspecified: Secondary | ICD-10-CM | POA: Diagnosis not present

## 2014-01-12 DIAGNOSIS — Z01810 Encounter for preprocedural cardiovascular examination: Secondary | ICD-10-CM | POA: Diagnosis not present

## 2014-01-12 DIAGNOSIS — I509 Heart failure, unspecified: Secondary | ICD-10-CM

## 2014-01-12 DIAGNOSIS — J4489 Other specified chronic obstructive pulmonary disease: Secondary | ICD-10-CM | POA: Insufficient documentation

## 2014-01-12 DIAGNOSIS — I25119 Atherosclerotic heart disease of native coronary artery with unspecified angina pectoris: Secondary | ICD-10-CM

## 2014-01-12 DIAGNOSIS — I10 Essential (primary) hypertension: Secondary | ICD-10-CM | POA: Insufficient documentation

## 2014-01-12 DIAGNOSIS — I4891 Unspecified atrial fibrillation: Secondary | ICD-10-CM

## 2014-01-12 DIAGNOSIS — I2510 Atherosclerotic heart disease of native coronary artery without angina pectoris: Secondary | ICD-10-CM

## 2014-01-12 DIAGNOSIS — I714 Abdominal aortic aneurysm, without rupture, unspecified: Secondary | ICD-10-CM | POA: Diagnosis not present

## 2014-01-12 DIAGNOSIS — I079 Rheumatic tricuspid valve disease, unspecified: Secondary | ICD-10-CM | POA: Insufficient documentation

## 2014-01-12 DIAGNOSIS — Z954 Presence of other heart-valve replacement: Secondary | ICD-10-CM

## 2014-01-12 DIAGNOSIS — I5032 Chronic diastolic (congestive) heart failure: Secondary | ICD-10-CM

## 2014-01-12 NOTE — Progress Notes (Signed)
Echocardiogram performed.  

## 2014-01-14 ENCOUNTER — Other Ambulatory Visit: Payer: Self-pay | Admitting: Family

## 2014-01-15 ENCOUNTER — Encounter: Payer: Self-pay | Admitting: Physician Assistant

## 2014-01-15 NOTE — Telephone Encounter (Signed)
Refill sent per [facility] refill protocol/SLS  

## 2014-01-19 ENCOUNTER — Encounter: Payer: Self-pay | Admitting: Internal Medicine

## 2014-01-22 ENCOUNTER — Ambulatory Visit (INDEPENDENT_AMBULATORY_CARE_PROVIDER_SITE_OTHER): Payer: Medicare Other

## 2014-01-22 DIAGNOSIS — Z5181 Encounter for therapeutic drug level monitoring: Secondary | ICD-10-CM

## 2014-01-22 DIAGNOSIS — Z954 Presence of other heart-valve replacement: Secondary | ICD-10-CM

## 2014-01-22 DIAGNOSIS — Z8679 Personal history of other diseases of the circulatory system: Secondary | ICD-10-CM

## 2014-01-22 DIAGNOSIS — I359 Nonrheumatic aortic valve disorder, unspecified: Secondary | ICD-10-CM

## 2014-01-22 DIAGNOSIS — I4891 Unspecified atrial fibrillation: Secondary | ICD-10-CM

## 2014-01-22 LAB — POCT INR: INR: 1.6

## 2014-01-24 ENCOUNTER — Ambulatory Visit (INDEPENDENT_AMBULATORY_CARE_PROVIDER_SITE_OTHER): Payer: Medicare Other | Admitting: Pharmacist

## 2014-01-24 DIAGNOSIS — Z5181 Encounter for therapeutic drug level monitoring: Secondary | ICD-10-CM

## 2014-01-24 DIAGNOSIS — I359 Nonrheumatic aortic valve disorder, unspecified: Secondary | ICD-10-CM

## 2014-01-24 DIAGNOSIS — Z8679 Personal history of other diseases of the circulatory system: Secondary | ICD-10-CM

## 2014-01-24 DIAGNOSIS — I4891 Unspecified atrial fibrillation: Secondary | ICD-10-CM

## 2014-01-24 DIAGNOSIS — Z954 Presence of other heart-valve replacement: Secondary | ICD-10-CM

## 2014-01-24 LAB — POCT INR: INR: 3.2

## 2014-01-25 ENCOUNTER — Other Ambulatory Visit: Payer: Self-pay

## 2014-01-25 MED ORDER — FINASTERIDE 5 MG PO TABS: ORAL_TABLET | ORAL | Status: AC

## 2014-01-25 MED ORDER — AMLODIPINE BESYLATE 5 MG PO TABS: ORAL_TABLET | ORAL | Status: AC

## 2014-01-30 ENCOUNTER — Telehealth: Payer: Self-pay | Admitting: Family

## 2014-01-30 DIAGNOSIS — C3491 Malignant neoplasm of unspecified part of right bronchus or lung: Secondary | ICD-10-CM

## 2014-01-30 DIAGNOSIS — C3490 Malignant neoplasm of unspecified part of unspecified bronchus or lung: Secondary | ICD-10-CM | POA: Insufficient documentation

## 2014-01-30 NOTE — Telephone Encounter (Signed)
Please contact pt to arrange a follow up visit.

## 2014-01-30 NOTE — Telephone Encounter (Signed)
Spoke to patient daughter Kennyth Lose and she states that she will have to call back later in the week to schedule this appointment.

## 2014-01-31 ENCOUNTER — Ambulatory Visit (INDEPENDENT_AMBULATORY_CARE_PROVIDER_SITE_OTHER): Payer: Medicare Other | Admitting: *Deleted

## 2014-01-31 DIAGNOSIS — Z8679 Personal history of other diseases of the circulatory system: Secondary | ICD-10-CM

## 2014-01-31 DIAGNOSIS — I4891 Unspecified atrial fibrillation: Secondary | ICD-10-CM

## 2014-01-31 DIAGNOSIS — I359 Nonrheumatic aortic valve disorder, unspecified: Secondary | ICD-10-CM

## 2014-01-31 DIAGNOSIS — Z5181 Encounter for therapeutic drug level monitoring: Secondary | ICD-10-CM

## 2014-01-31 DIAGNOSIS — Z954 Presence of other heart-valve replacement: Secondary | ICD-10-CM

## 2014-01-31 LAB — POCT INR: INR: 2.5

## 2014-02-10 ENCOUNTER — Other Ambulatory Visit: Payer: Self-pay | Admitting: Cardiology

## 2014-02-14 ENCOUNTER — Encounter: Payer: Medicare Other | Admitting: Cardiology

## 2014-02-14 NOTE — Progress Notes (Signed)
HPI: FU CAD s/p CABG and St Jude (mechanical) AVR in 1993, permanent atrial fibrillation, diastolic CHF, AAA s/p stent repair in 2001 (Dr. Amedeo Plenty >>> follows with Dr. Scot Dock), HTN, symptomatic bradycardia s/p PPM, HL, COPD. Seen by Richardson Dopp 7/15 prior to biopsy of lung mass. Since last seen,  Studies:  - Echo (12/10): EF 60-65, mechanical AVR with mean gradient 21 mmHg, mild MR, RAE, moderate TR, moderate to severe pulmonary hypertension. - Nuclear (5/10): Lateral scar with mod peri-infarct ischemia (reviewd by Dr. Kirk Ruths and felt to be mild), EF 57%; Low Risk   Current Outpatient Prescriptions  Medication Sig Dispense Refill  . albuterol (PROVENTIL) (2.5 MG/3ML) 0.083% nebulizer solution USE 1 VIAL VIA NEBULIZER EVERY 6 HOURS AS NEEDED FOR WHEEZING OR SHORTNESS OF BREATH  150 mL  1  . amLODipine (NORVASC) 5 MG tablet TAKE 1 TABLET BY MOUTH DAILY  30 tablet  1  . carvedilol (COREG) 25 MG tablet TAKE 1 TABLET BY MOUTH TWICE DAILY  60 tablet  0  . colchicine 0.6 MG tablet Take 1 tablet (0.6 mg total) by mouth 2 (two) times daily.  60 tablet  1  . Cyanocobalamin (VITAMIN B 12 PO) Take 500 mg by mouth daily.      . digoxin (DIGOX) 0.125 MG tablet TAKE 1 TABLET BY MOUTH EVERY DAY  30 tablet  3  . Docusate Calcium (STOOL SOFTENER PO) Take 1 tablet by mouth daily.      . finasteride (PROSCAR) 5 MG tablet TAKE 1 TABLET BY MOUTH DAILY  30 tablet  1  . furosemide (LASIX) 20 MG tablet Take 1 1/2 to 2 tablets daily for fluid.  60 tablet  0  . LORazepam (ATIVAN) 1 MG tablet TAKE 1/2 TABLET BY MOUTH ONCE DAILY AS NEEDED  30 tablet  0  . losartan (COZAAR) 50 MG tablet TAKE 1 TABLET BY MOUTH DAILY  30 tablet  2  . Multiple Vitamins-Minerals (EYE-VITES) TABS Take by mouth daily.      Marland Kitchen nystatin ointment (MYCOSTATIN) Apply topically 2 (two) times daily. Apply twice daily to buttocks/rash until healed  30 g  1  . omeprazole (PRILOSEC) 20 MG capsule Take 20 mg by mouth daily. For reflux         . Silodosin (RAPAFLO PO) Take 8 mg by mouth daily. To help with urinary flow      . simvastatin (ZOCOR) 20 MG tablet TAKE 1 TABLET BY MOUTH AT BEDTIME  90 tablet  1  . traMADol (ULTRAM) 50 MG tablet Take 1 tablet (50 mg total) by mouth every 8 (eight) hours as needed.  90 tablet  0  . warfarin (COUMADIN) 5 MG tablet USE AS DIRECTED BY ANTICOAGULANT CLINIC  30 tablet  2   No current facility-administered medications for this visit.     Past Medical History  Diagnosis Date  . CAD (coronary artery disease)     a.  s/p CABG 1993;  b.  Nuclear (5/10):  Lateral scar with mod peri-infarct ischemia (reviewd by Dr. Kirk Ruths and felt to be mild), EF 57%; Low Risk  . COPD (chronic obstructive pulmonary disease)   . History of Clostridium difficile colitis   . Hyperlipidemia   . HTN (hypertension)   . Osteoarthritis   . BPH (benign prostatic hypertrophy)   . Low back pain   . Lumbar disc disease   . Chronic diastolic heart failure   . Permanent atrial fibrillation  coumadin followed at [facility]  . Colonic polyp   . Diverticulosis of colon   . Anxiety   . GERD (gastroesophageal reflux disease)   . PVD (peripheral vascular disease)   . TIA (transient ischemic attack)   . Tachy-brady syndrome     s/p PPM (Dr Lovena Le)  . AAA (abdominal aortic aneurysm)     s/p stent graft repair 2001 (Dr. Amedeo Plenty)  . Aortic valve disorders     a.  s/p St Jude AVR 1993;  b.  Echo (12/10):  EF 55-60%, normal wall motion, mechanical AVR okay, MAC, mild LAE, moderate RAE  . Lung mass     bx scheduled at [facility] 01/15/2014  . Hx of echocardiogram     Echo (8/15): Severe LVH, EF 60-65%, mechanical AVR okay (mean 11 mm Hg), ascending aorta 39 mm (borderline dilated), MAC, mild MR, normal RV function, severe BAE, PASP 67 mm Hg    Past Surgical History  Procedure Laterality Date  . Coronary artery bypass graft  1993  . Pacemaker placement  1992 and 2005    permanent and new placement 2005  . Abdominal  aortic aneurysm repair      with intraluminal graft  . Aortic valve replacement      s/p   . Retinal detachment surgery      repair of right eye 9/211- Dr Zadie Rhine    History   Social History  . Marital Status: Widowed    Spouse Name: N/A    Number of Children: N/A  . Years of Education: N/A   Occupational History  . Not on file.   Social History Main Topics  . Smoking status: Former Smoker    Quit date: 11/23/1991  . Smokeless tobacco: Current User    Types: Chew  . Alcohol Use: No  . Drug Use: No  . Sexual Activity: Not on file   Other Topics Concern  . Not on file   Social History Narrative   Former Smoker     Alcohol use-no   Widowed                ROS: no fevers or chills, productive cough, hemoptysis, dysphasia, odynophagia, melena, hematochezia, dysuria, hematuria, rash, seizure activity, orthopnea, PND, pedal edema, claudication. Remaining systems are negative.  Physical Exam: Well-developed well-nourished in no acute distress.  Skin is warm and dry.  HEENT is normal.  Neck is supple.  Chest is clear to auscultation with normal expansion.  Cardiovascular exam is regular rate and rhythm.  Abdominal exam nontender or distended. No masses palpated. Extremities show no edema. neuro grossly intact  ECG     This encounter was created in error - please disregard.

## 2014-02-15 ENCOUNTER — Other Ambulatory Visit: Payer: Self-pay | Admitting: Family

## 2014-02-16 ENCOUNTER — Telehealth: Payer: Self-pay | Admitting: *Deleted

## 2014-02-16 NOTE — Telephone Encounter (Signed)
Dr Marlou Porch received a DOD call and discussed the pts care with [facility] as the patient is now on Hospice.  His life expectancy is days to weeks.  It was decided pt will d/c coumadin and digoxin.

## 2014-02-20 ENCOUNTER — Other Ambulatory Visit: Payer: Self-pay | Admitting: Family

## 2014-02-20 NOTE — Telephone Encounter (Signed)
Medication Detail      Disp Refills Start End     digoxin (DIGOX) 0.125 MG tablet 30 tablet 3 11/07/2013     Sig: TAKE 1 TABLET BY MOUTH EVERY DAY    E-Prescribing Status: Receipt confirmed by pharmacy (11/07/2013 4:41 PM EDT)

## 2014-02-20 NOTE — Telephone Encounter (Signed)
Refills sent

## 2014-02-23 ENCOUNTER — Ambulatory Visit: Payer: Medicare Other | Admitting: Family

## 2014-03-01 ENCOUNTER — Ambulatory Visit: Payer: Self-pay | Admitting: Pharmacist

## 2014-03-01 DIAGNOSIS — Z5181 Encounter for therapeutic drug level monitoring: Secondary | ICD-10-CM

## 2014-03-01 DIAGNOSIS — Z8679 Personal history of other diseases of the circulatory system: Secondary | ICD-10-CM

## 2014-03-01 DIAGNOSIS — Z954 Presence of other heart-valve replacement: Secondary | ICD-10-CM

## 2014-03-01 DIAGNOSIS — I4891 Unspecified atrial fibrillation: Secondary | ICD-10-CM

## 2014-03-08 DEATH — deceased

## 2014-11-23 ENCOUNTER — Encounter: Payer: Self-pay | Admitting: Family

## 2014-11-28 ENCOUNTER — Other Ambulatory Visit (HOSPITAL_COMMUNITY): Payer: Medicare Other

## 2014-11-28 ENCOUNTER — Encounter (HOSPITAL_COMMUNITY): Payer: Medicare Other

## 2014-11-28 ENCOUNTER — Ambulatory Visit: Payer: Medicare Other | Admitting: Family

## 2015-01-30 IMAGING — CR DG CHEST 2V
2 series · 2 of 2 positions shown · non-contrast
Comparison: 05/27/2013

CLINICAL DATA: Right-sided chest pain following recent injury

EXAM:
CHEST  2 VIEW

[w chest pa]
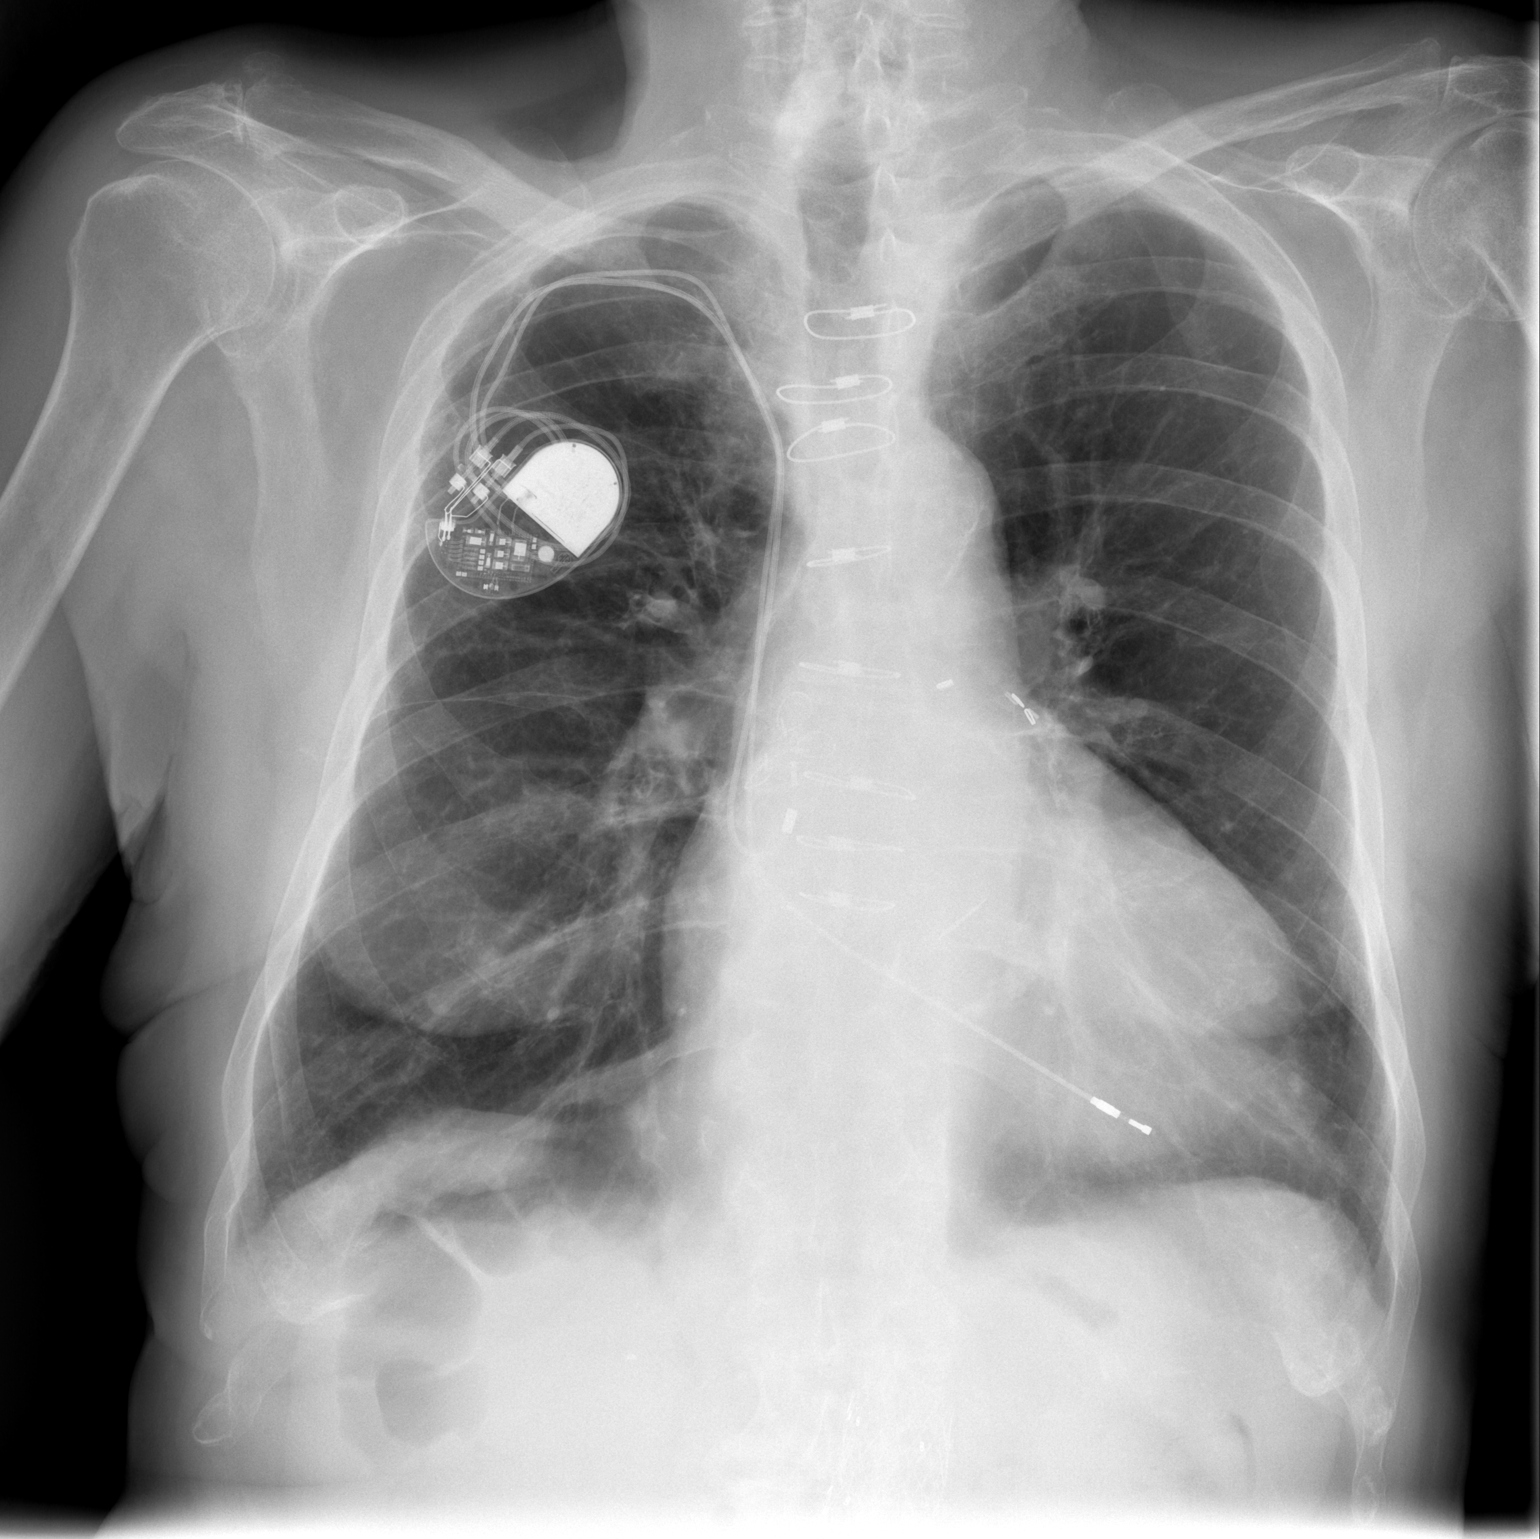

[w chest lat]
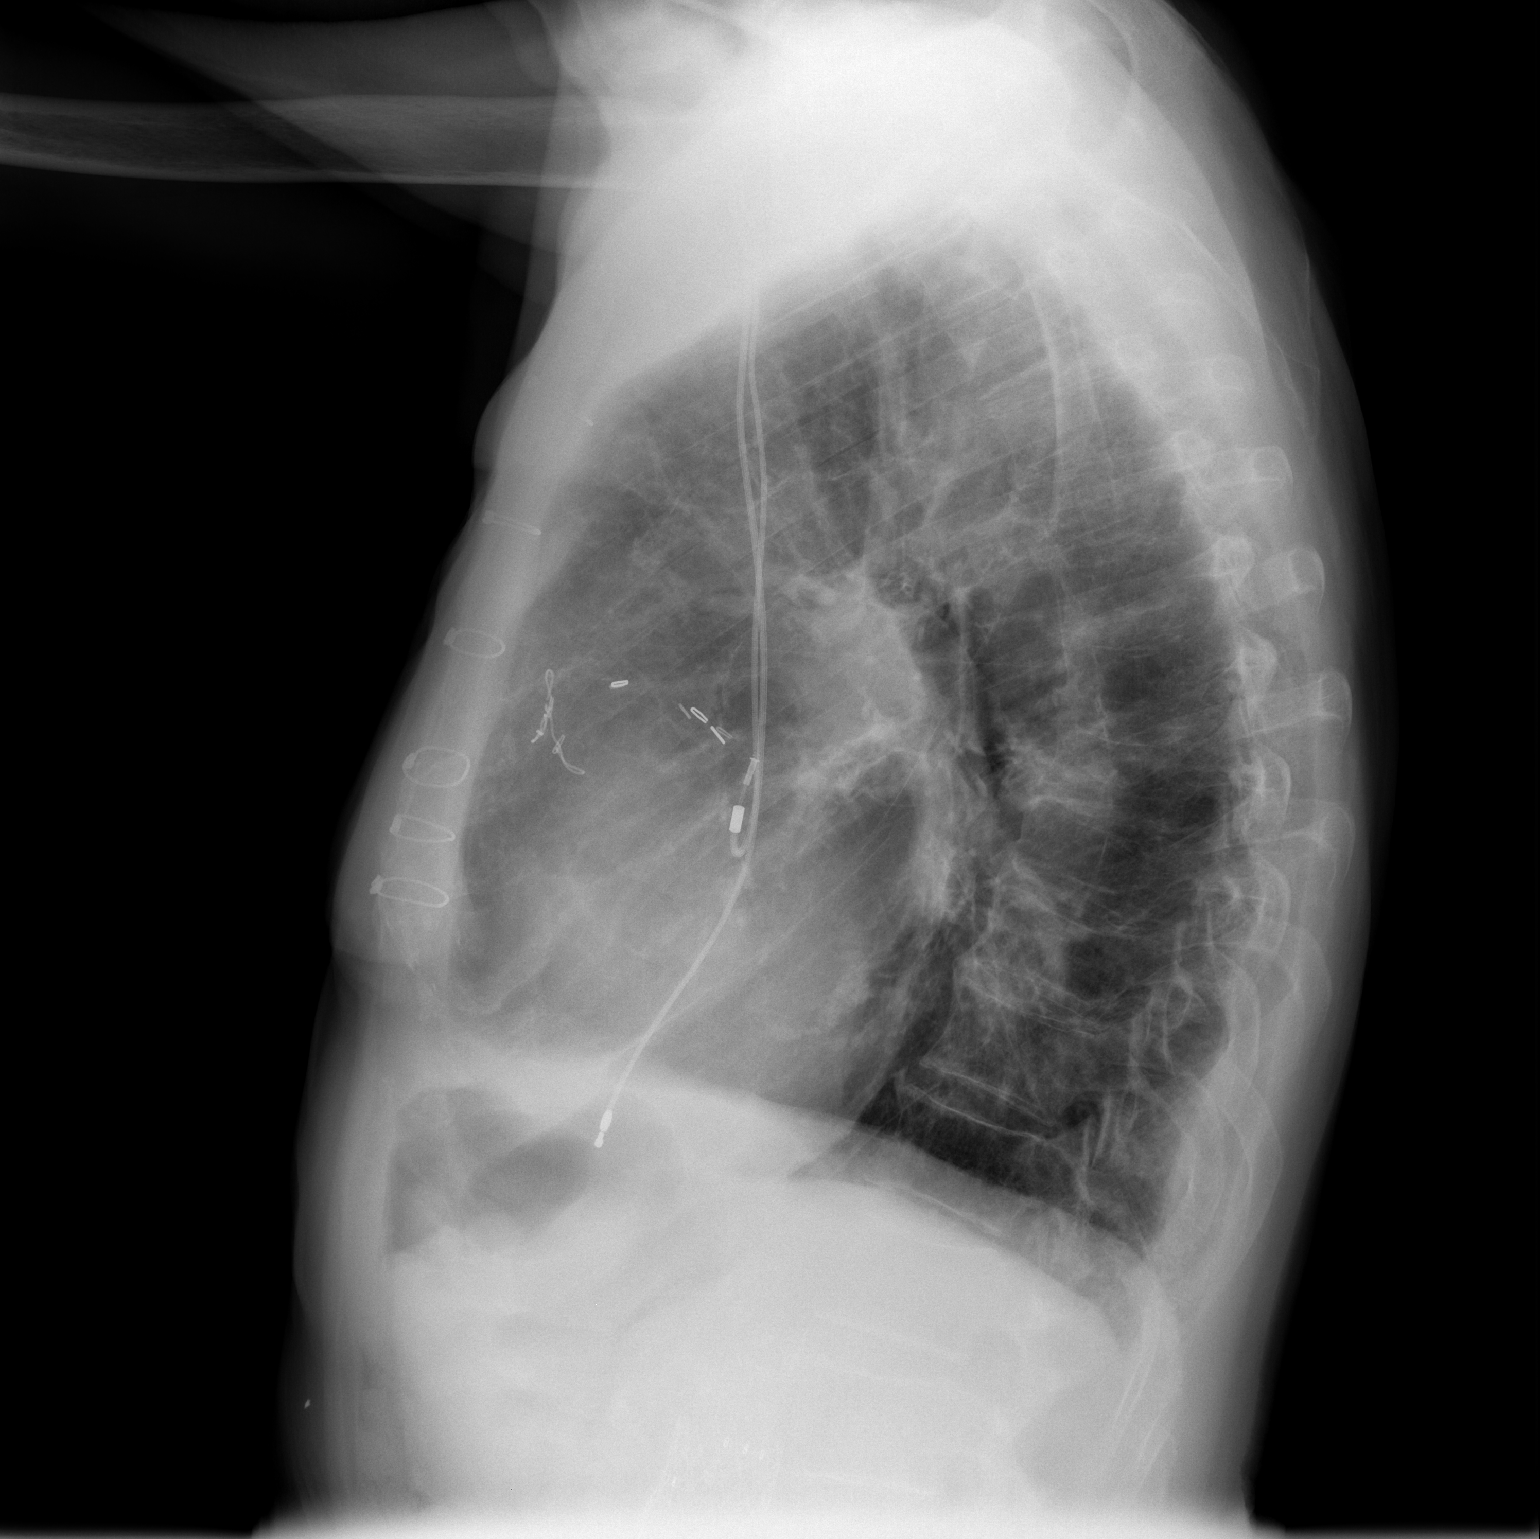

[2 of 2 positions shown; findings below may reference images not displayed]

FINDINGS: The lungs are well aerated bilaterally. No focal infiltrate or
sizable effusion is seen. Bilateral nipple shadows are noted. A
pacing device is again seen. The cardiac shadow is stable.
IMPRESSION: No acute abnormality is noted.

## 2015-01-30 IMAGING — US US ABDOMEN COMPLETE
1 series · 14 of 25 positions shown · non-contrast
Comparison: 02/24/2007-CT abdomen, 12/20/2012-CT abdomen

CLINICAL DATA: Right ribcage pain

EXAM:
ULTRASOUND ABDOMEN COMPLETE

[Series 1: us abdomen complete · 0.32mm/px · 14 of 82 slices shown]
[im 1/82]
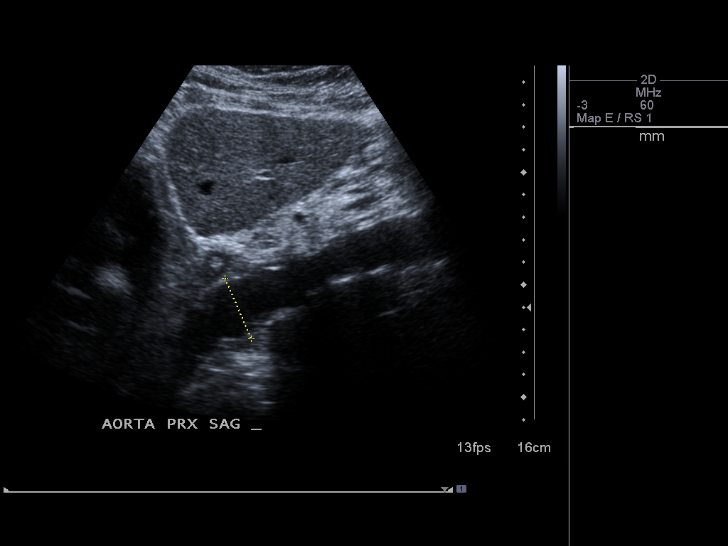
[im 7/82]
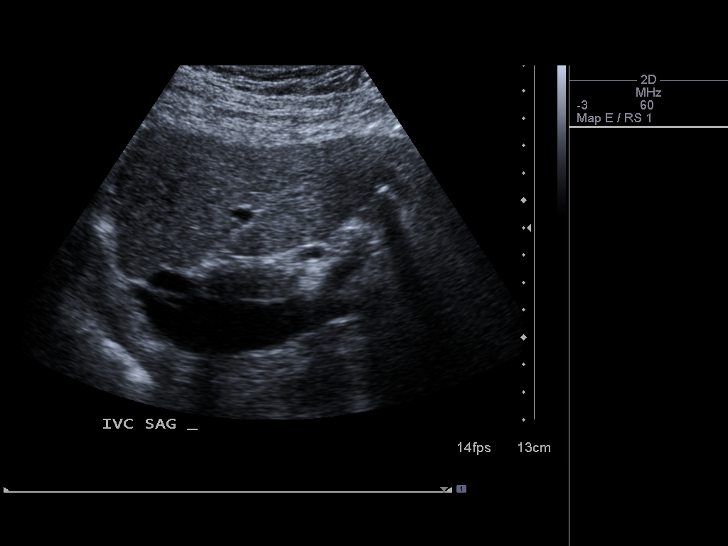
[im 14/82]
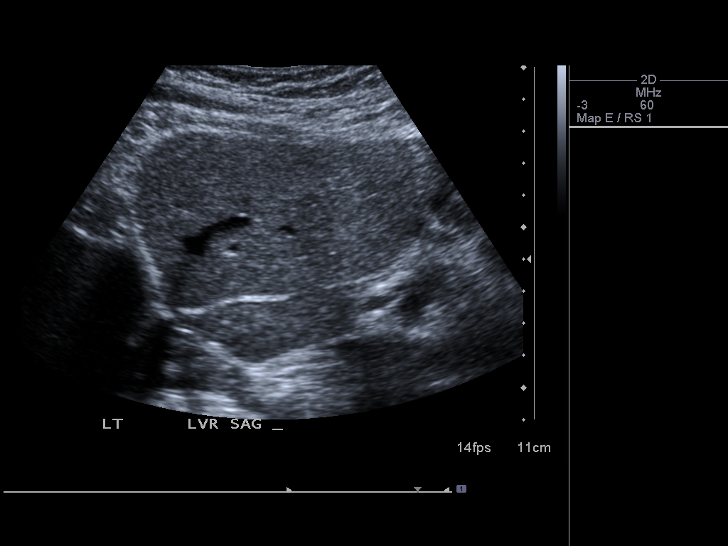
[im 21/82]
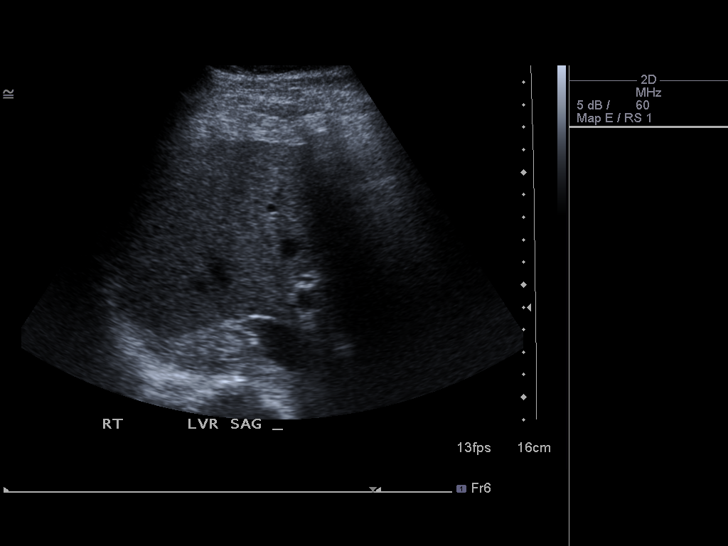
[im 28/82]
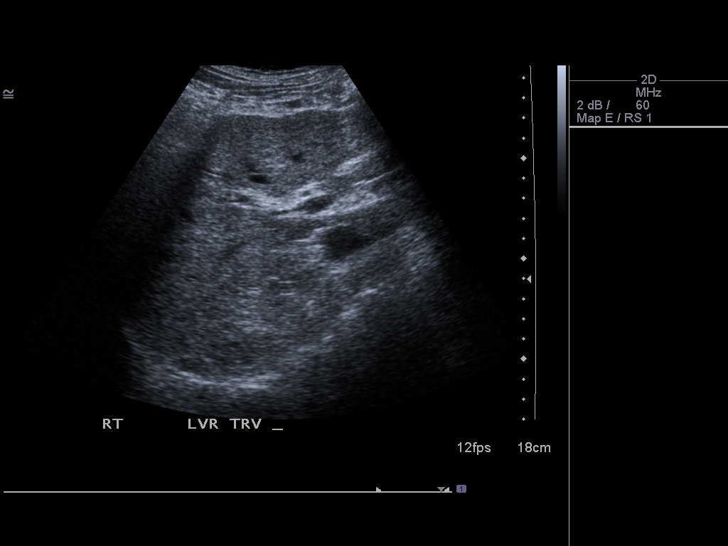
[im 31/82]
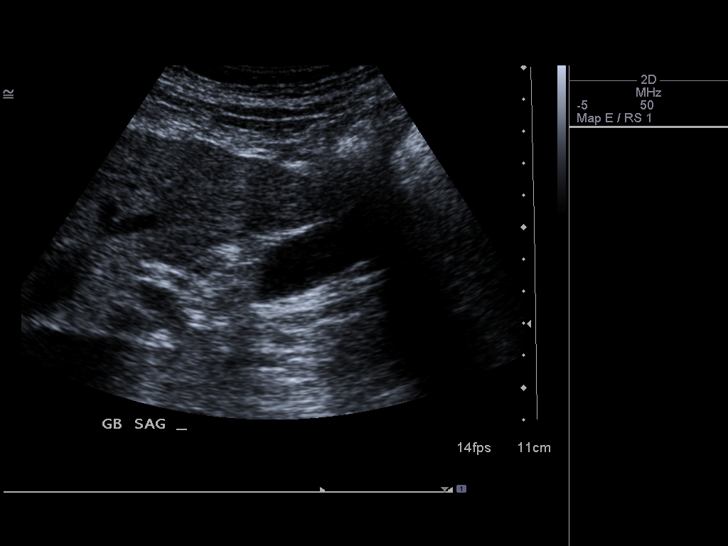
[im 38/82]
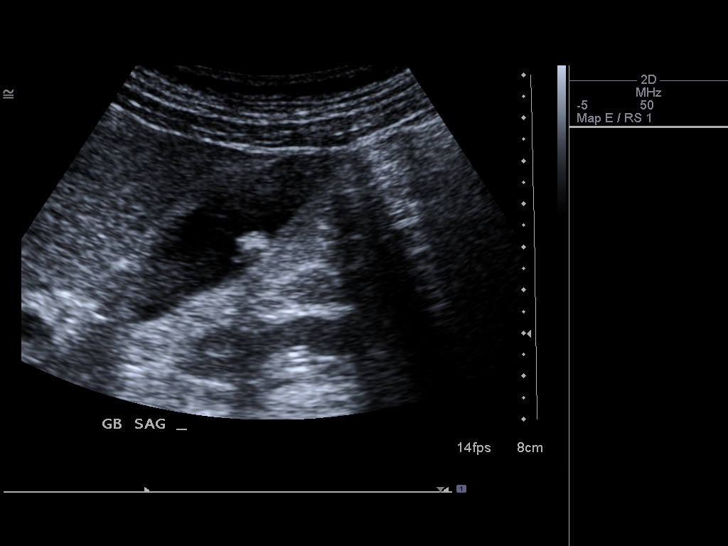
[im 44/82]
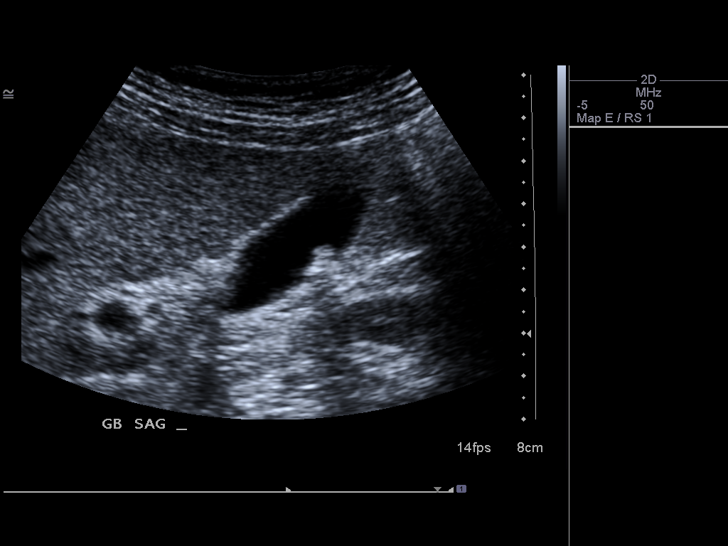
[im 51/82]
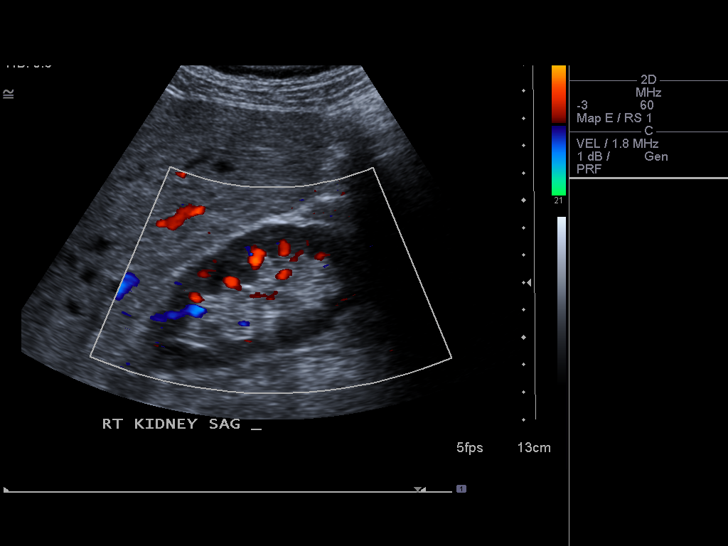
[im 55/82]
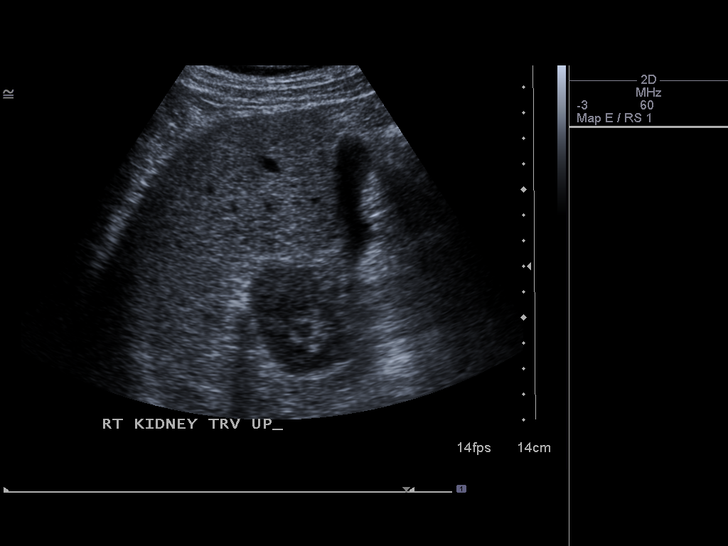
[im 61/82]
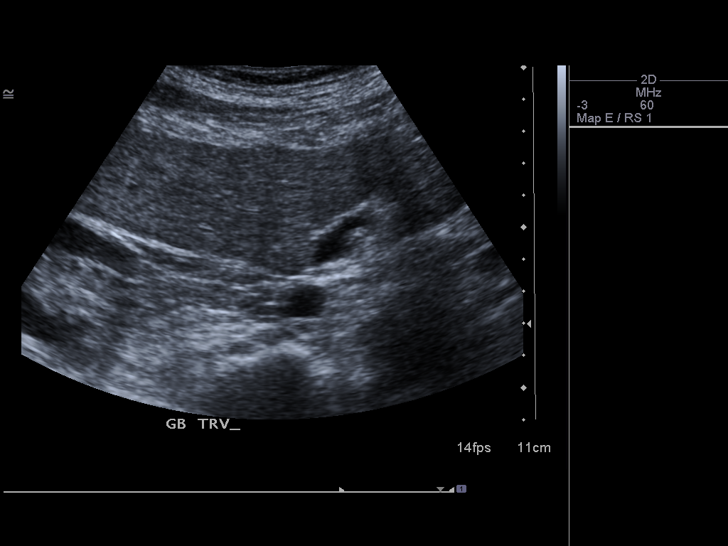
[im 68/82]
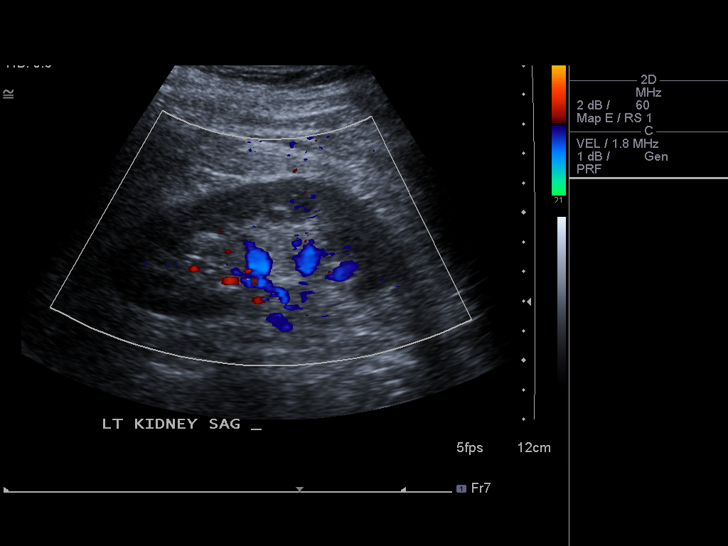
[im 75/82]
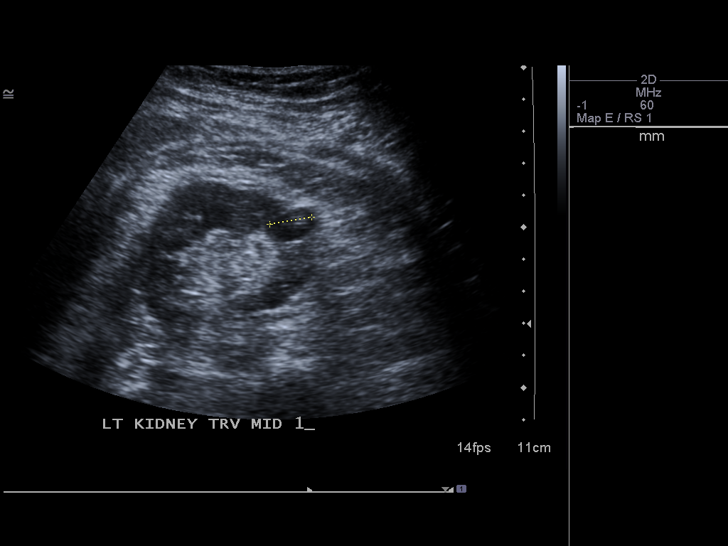
[im 82/82]
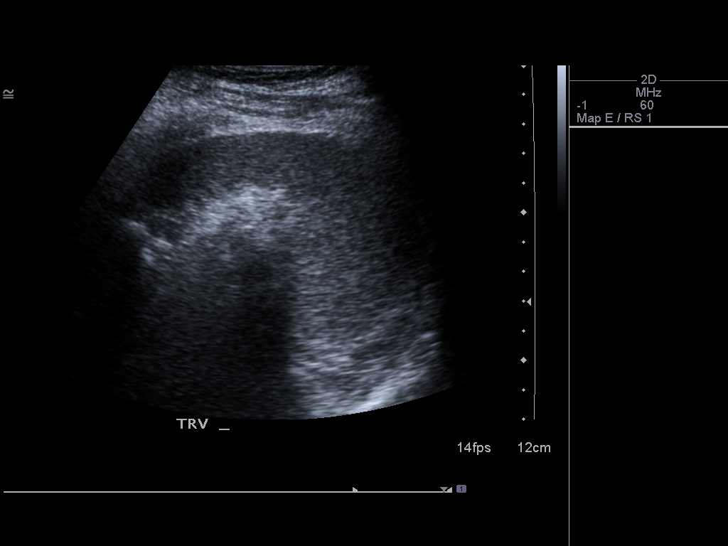

[14 of 25 positions shown; findings below may reference images not displayed]

FINDINGS: Gallbladder:

Cholelithiasis. No pericholecystic fluid or gallbladder wall
thickening. Negative sonographic Murphy sign.

Common bile duct:

Diameter: 3 mm

Liver:

No focal lesion identified.  Increased parenchymal echogenicity.

IVC:

No abnormality visualized.

Pancreas:

Limited visualization secondary to overlying bowel gas.

Spleen:

Size and appearance within normal limits.

Right Kidney:

Length: 9.7 cm. Echogenicity within normal limits. No mass or
hydronephrosis visualized.

Left Kidney:

Length: 10.4 cm. 1.5 x 1.3 x 0.9 cm hypoechoic left posterior
interpolar mass.. Echogenicity within normal limits. No
hydronephrosis visualized.

Abdominal aorta:

Aortic bi-iliac stent graft present. Aneurysm sac measures 3.3 cm
distally.

Other findings:

None.
IMPRESSION: 1. Cholelithiasis without sonographic evidence of acute
cholecystitis.
2. Prior abdominal aortic aneurysm repair.
3. Indeterminate 15 mm hypoechoic left renal mass. This is unchanged
when compared with prior CT dated 12/20/2012.
4. Hepatic steatosis.
